# Patient Record
Sex: Female | Born: 1995
Health system: Southern US, Community
[De-identification: ages and names within clinical notes are randomized; demographics above are authoritative.]

## PROBLEM LIST (undated history)

## (undated) DIAGNOSIS — O139 Gestational [pregnancy-induced] hypertension without significant proteinuria, unspecified trimester: Secondary | ICD-10-CM

## (undated) DIAGNOSIS — Z789 Other specified health status: Secondary | ICD-10-CM

## (undated) HISTORY — PX: NO PAST SURGERIES: SHX2092

## (undated) HISTORY — DX: Gestational (pregnancy-induced) hypertension without significant proteinuria, unspecified trimester: O13.9

---
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
## 2019-02-05 NOTE — L&D Delivery Note (Addendum)
Delivery Note Called to room shortly after epidural d/t bradycardic episodes.  Had already received phenylephrine, IVF and position changes.  Found to be C/C/+1.  Pushed very well, but FHR dropped to 60's-70's with crowning.  Ctx left, pt pushing but not much more advancement.  After consenting pt and her husband, asked for [facility], Dr. Earlene Plater arrived in room.  A ctx hit pt and she pushed the baby out without having to apply the vacuum. At 11:09 PM a viable female was delivered via Vaginal, Spontaneous (Presentation: ROA, nuchal cord delivered through.  APGAR: , 9; weight pending  After 1 minute, the cord was clamped and cut. 40 units of pitocin diluted in 1000cc LR was infused rapidly IV.  The placenta separated spontaneously and delivered via CCT and maternal pushing effort.  It was inspected and appears to be intact with a 3 VC.   Cord pH: 7.05  Anesthesia: Epidural;Local Episiotomy: None Lacerations: Periurethral Suture Repair: 3.0 monocryl Est. Blood Loss (mL):  411  Mom to postpartum.  Baby to Couplet care / Skin to Skin.  Jacklyn Shell 08/02/2019, 11:44 PM

## 2019-05-05 ENCOUNTER — Encounter: Payer: Self-pay | Admitting: Advanced Practice Midwife

## 2019-05-24 DIAGNOSIS — O0930 Supervision of pregnancy with insufficient antenatal care, unspecified trimester: Secondary | ICD-10-CM | POA: Diagnosis not present

## 2019-05-24 DIAGNOSIS — Z789 Other specified health status: Secondary | ICD-10-CM | POA: Diagnosis not present

## 2019-05-24 DIAGNOSIS — Z23 Encounter for immunization: Secondary | ICD-10-CM | POA: Diagnosis not present

## 2019-05-24 DIAGNOSIS — Z3403 Encounter for supervision of normal first pregnancy, third trimester: Secondary | ICD-10-CM | POA: Diagnosis not present

## 2019-05-24 LAB — OB RESULTS CONSOLE RPR: RPR: NONREACTIVE

## 2019-05-24 LAB — OB RESULTS CONSOLE RUBELLA ANTIBODY, IGM: Rubella: IMMUNE

## 2019-05-24 LAB — OB RESULTS CONSOLE ABO/RH: RH Type: POSITIVE

## 2019-05-24 LAB — OB RESULTS CONSOLE HEPATITIS B SURFACE ANTIGEN: Hepatitis B Surface Ag: NEGATIVE

## 2019-05-24 LAB — OB RESULTS CONSOLE HIV ANTIBODY (ROUTINE TESTING): HIV: NONREACTIVE

## 2019-05-24 LAB — OB RESULTS CONSOLE GC/CHLAMYDIA
Chlamydia: NEGATIVE
Gonorrhea: NEGATIVE

## 2019-05-24 LAB — OB RESULTS CONSOLE ANTIBODY SCREEN: Antibody Screen: NEGATIVE

## 2019-05-24 LAB — OB RESULTS CONSOLE VARICELLA ZOSTER ANTIBODY, IGG: Varicella: IMMUNE

## 2019-05-25 ENCOUNTER — Other Ambulatory Visit: Payer: Self-pay | Admitting: Nurse Practitioner

## 2019-05-25 DIAGNOSIS — N6310 Unspecified lump in the right breast, unspecified quadrant: Secondary | ICD-10-CM

## 2019-06-10 DIAGNOSIS — O0930 Supervision of pregnancy with insufficient antenatal care, unspecified trimester: Secondary | ICD-10-CM | POA: Diagnosis not present

## 2019-06-10 DIAGNOSIS — O99213 Obesity complicating pregnancy, third trimester: Secondary | ICD-10-CM | POA: Diagnosis not present

## 2019-06-14 ENCOUNTER — Other Ambulatory Visit: Payer: Self-pay | Admitting: Obstetrics & Gynecology

## 2019-06-14 DIAGNOSIS — Z3A32 32 weeks gestation of pregnancy: Secondary | ICD-10-CM

## 2019-06-14 DIAGNOSIS — Z3689 Encounter for other specified antenatal screening: Secondary | ICD-10-CM

## 2019-06-14 DIAGNOSIS — O0930 Supervision of pregnancy with insufficient antenatal care, unspecified trimester: Secondary | ICD-10-CM

## 2019-06-16 ENCOUNTER — Other Ambulatory Visit: Payer: Self-pay

## 2019-06-16 ENCOUNTER — Other Ambulatory Visit: Payer: Self-pay | Admitting: Nurse Practitioner

## 2019-06-16 ENCOUNTER — Ambulatory Visit
Admission: RE | Admit: 2019-06-16 | Discharge: 2019-06-16 | Disposition: A | Discharge status: Home / Self Care | Payer: Medicaid Other | Source: Ambulatory Visit | Attending: Nurse Practitioner | Admitting: Nurse Practitioner

## 2019-06-16 DIAGNOSIS — N6310 Unspecified lump in the right breast, unspecified quadrant: Secondary | ICD-10-CM

## 2019-06-16 DIAGNOSIS — N6313 Unspecified lump in the right breast, lower outer quadrant: Secondary | ICD-10-CM | POA: Diagnosis not present

## 2019-06-21 ENCOUNTER — Encounter: Payer: Self-pay | Admitting: *Deleted

## 2019-06-23 ENCOUNTER — Ambulatory Visit: Payer: Medicaid Other | Admitting: *Deleted

## 2019-06-23 ENCOUNTER — Other Ambulatory Visit: Payer: Self-pay | Admitting: Obstetrics & Gynecology

## 2019-06-23 ENCOUNTER — Ambulatory Visit: Payer: Medicaid Other | Attending: Obstetrics and Gynecology

## 2019-06-23 ENCOUNTER — Other Ambulatory Visit: Payer: Self-pay

## 2019-06-23 ENCOUNTER — Ambulatory Visit (HOSPITAL_BASED_OUTPATIENT_CLINIC_OR_DEPARTMENT_OTHER): Payer: Medicaid Other | Admitting: Obstetrics and Gynecology

## 2019-06-23 VITALS — BP 134/80 | HR 83 | Ht 62.0 in

## 2019-06-23 DIAGNOSIS — O0990 Supervision of high risk pregnancy, unspecified, unspecified trimester: Secondary | ICD-10-CM | POA: Insufficient documentation

## 2019-06-23 DIAGNOSIS — O0933 Supervision of pregnancy with insufficient antenatal care, third trimester: Secondary | ICD-10-CM | POA: Diagnosis not present

## 2019-06-23 DIAGNOSIS — Z3A32 32 weeks gestation of pregnancy: Secondary | ICD-10-CM

## 2019-06-23 DIAGNOSIS — O0930 Supervision of pregnancy with insufficient antenatal care, unspecified trimester: Secondary | ICD-10-CM | POA: Diagnosis not present

## 2019-06-23 DIAGNOSIS — O365930 Maternal care for other known or suspected poor fetal growth, third trimester, not applicable or unspecified: Secondary | ICD-10-CM | POA: Insufficient documentation

## 2019-06-23 DIAGNOSIS — Z3A31 31 weeks gestation of pregnancy: Secondary | ICD-10-CM | POA: Insufficient documentation

## 2019-06-23 DIAGNOSIS — Z363 Encounter for antenatal screening for malformations: Secondary | ICD-10-CM

## 2019-06-23 DIAGNOSIS — Z3689 Encounter for other specified antenatal screening: Secondary | ICD-10-CM | POA: Insufficient documentation

## 2019-06-23 NOTE — Procedures (Signed)
Christie Parker 06/01/1995 [redacted]w[redacted]d  Fetus A Non-Stress Test Interpretation for 06/23/19  Indication: IUGR  Fetal Heart Rate A Mode: External Baseline Rate (A): 140 bpm Variability: Moderate Accelerations: 15 x 15 Decelerations: None Multiple birth?: No  Uterine Activity Mode: Toco Contraction Frequency (min): Occ UC noted  Contraction Duration (sec): 30-50 Contraction Quality: Mild Resting Tone Palpated: Relaxed Resting Time: Adequate  Interpretation (Fetal Testing) Nonstress Test Interpretation: Reactive Comments: FHR tracing rev'd by Dr. Shankar   

## 2019-06-23 NOTE — Procedures (Signed)
Christie Parker 01-15-1996 101w2d  Fetus A Non-Stress Test Interpretation for 06/23/19  Indication: IUGR  Fetal Heart Rate A Mode: External Baseline Rate (A): 140 bpm Variability: Moderate Accelerations: 15 x 15 Decelerations: None Multiple birth?: No  Uterine Activity Mode: Toco Contraction Frequency (min): Occ UC noted  Contraction Duration (sec): 30-50 Contraction Quality: Mild Resting Tone Palpated: Relaxed Resting Time: Adequate  Interpretation (Fetal Testing) Nonstress Test Interpretation: Reactive Comments: FHR tracing rev'd by Dr. Judeth Cornfield

## 2019-06-28 ENCOUNTER — Other Ambulatory Visit: Payer: Self-pay | Admitting: *Deleted

## 2019-06-28 DIAGNOSIS — O365930 Maternal care for other known or suspected poor fetal growth, third trimester, not applicable or unspecified: Secondary | ICD-10-CM

## 2019-07-08 ENCOUNTER — Ambulatory Visit: Payer: Medicaid Other | Admitting: *Deleted

## 2019-07-08 ENCOUNTER — Ambulatory Visit: Payer: Medicaid Other | Attending: Obstetrics and Gynecology

## 2019-07-08 ENCOUNTER — Other Ambulatory Visit: Payer: Self-pay

## 2019-07-08 ENCOUNTER — Other Ambulatory Visit: Payer: Self-pay | Admitting: Obstetrics and Gynecology

## 2019-07-08 VITALS — BP 119/75 | HR 86

## 2019-07-08 DIAGNOSIS — O365930 Maternal care for other known or suspected poor fetal growth, third trimester, not applicable or unspecified: Secondary | ICD-10-CM

## 2019-07-08 DIAGNOSIS — Z363 Encounter for antenatal screening for malformations: Secondary | ICD-10-CM

## 2019-07-08 DIAGNOSIS — O0933 Supervision of pregnancy with insufficient antenatal care, third trimester: Secondary | ICD-10-CM | POA: Diagnosis not present

## 2019-07-08 DIAGNOSIS — Z3A33 33 weeks gestation of pregnancy: Secondary | ICD-10-CM

## 2019-07-08 DIAGNOSIS — O289 Unspecified abnormal findings on antenatal screening of mother: Secondary | ICD-10-CM | POA: Diagnosis not present

## 2019-07-08 NOTE — Procedures (Signed)
Christie Parker 1996/01/10 [redacted]w[redacted]d  Fetus A Non-Stress Test Interpretation for 07/08/19  Indication: IUGR  Fetal Heart Rate A Mode: External Baseline Rate (A): 140 bpm Variability: Moderate Accelerations: 15 x 15 Decelerations: None Multiple birth?: No  Uterine Activity Mode: Toco Contraction Frequency (min): one UC noted Contraction Duration (sec): 80 Contraction Quality: Mild Resting Tone Palpated: Relaxed Resting Time: Adequate  Interpretation (Fetal Testing) Nonstress Test Interpretation: Reactive Comments: FHR tracing rev'd by Dr. Parke Poisson

## 2019-07-12 ENCOUNTER — Other Ambulatory Visit: Payer: Self-pay | Admitting: Obstetrics & Gynecology

## 2019-07-15 ENCOUNTER — Ambulatory Visit: Payer: Medicaid Other | Admitting: *Deleted

## 2019-07-15 ENCOUNTER — Other Ambulatory Visit: Payer: Self-pay | Admitting: Obstetrics and Gynecology

## 2019-07-15 ENCOUNTER — Inpatient Hospital Stay (HOSPITAL_COMMUNITY)
Admission: AD | Admit: 2019-07-15 | Discharge: 2019-07-15 | Disposition: A | Discharge status: Home / Self Care | Payer: Medicaid Other | Attending: Obstetrics and Gynecology | Admitting: Obstetrics and Gynecology

## 2019-07-15 ENCOUNTER — Ambulatory Visit: Payer: Medicaid Other | Attending: Obstetrics and Gynecology

## 2019-07-15 ENCOUNTER — Inpatient Hospital Stay (HOSPITAL_COMMUNITY): Payer: Medicaid Other

## 2019-07-15 ENCOUNTER — Encounter (HOSPITAL_COMMUNITY): Payer: Self-pay | Admitting: Obstetrics and Gynecology

## 2019-07-15 ENCOUNTER — Inpatient Hospital Stay (HOSPITAL_BASED_OUTPATIENT_CLINIC_OR_DEPARTMENT_OTHER): Payer: Medicaid Other

## 2019-07-15 ENCOUNTER — Other Ambulatory Visit: Payer: Self-pay | Admitting: *Deleted

## 2019-07-15 ENCOUNTER — Other Ambulatory Visit: Payer: Self-pay

## 2019-07-15 VITALS — BP 138/94 | HR 98

## 2019-07-15 DIAGNOSIS — O212 Late vomiting of pregnancy: Secondary | ICD-10-CM | POA: Insufficient documentation

## 2019-07-15 DIAGNOSIS — O471 False labor at or after 37 completed weeks of gestation: Secondary | ICD-10-CM | POA: Diagnosis not present

## 2019-07-15 DIAGNOSIS — K529 Noninfective gastroenteritis and colitis, unspecified: Secondary | ICD-10-CM

## 2019-07-15 DIAGNOSIS — O99891 Other specified diseases and conditions complicating pregnancy: Secondary | ICD-10-CM | POA: Diagnosis not present

## 2019-07-15 DIAGNOSIS — O365931 Maternal care for other known or suspected poor fetal growth, third trimester, fetus 1: Secondary | ICD-10-CM

## 2019-07-15 DIAGNOSIS — Z3A34 34 weeks gestation of pregnancy: Secondary | ICD-10-CM | POA: Diagnosis not present

## 2019-07-15 DIAGNOSIS — Z8759 Personal history of other complications of pregnancy, childbirth and the puerperium: Secondary | ICD-10-CM

## 2019-07-15 DIAGNOSIS — O365930 Maternal care for other known or suspected poor fetal growth, third trimester, not applicable or unspecified: Secondary | ICD-10-CM

## 2019-07-15 DIAGNOSIS — O26893 Other specified pregnancy related conditions, third trimester: Secondary | ICD-10-CM | POA: Insufficient documentation

## 2019-07-15 DIAGNOSIS — O365990 Maternal care for other known or suspected poor fetal growth, unspecified trimester, not applicable or unspecified: Secondary | ICD-10-CM

## 2019-07-15 DIAGNOSIS — K8020 Calculus of gallbladder without cholecystitis without obstruction: Secondary | ICD-10-CM | POA: Diagnosis not present

## 2019-07-15 DIAGNOSIS — O289 Unspecified abnormal findings on antenatal screening of mother: Secondary | ICD-10-CM

## 2019-07-15 DIAGNOSIS — Z362 Encounter for other antenatal screening follow-up: Secondary | ICD-10-CM

## 2019-07-15 DIAGNOSIS — R141 Gas pain: Secondary | ICD-10-CM | POA: Diagnosis not present

## 2019-07-15 DIAGNOSIS — O133 Gestational [pregnancy-induced] hypertension without significant proteinuria, third trimester: Secondary | ICD-10-CM | POA: Diagnosis not present

## 2019-07-15 DIAGNOSIS — R1011 Right upper quadrant pain: Secondary | ICD-10-CM | POA: Diagnosis not present

## 2019-07-15 DIAGNOSIS — R030 Elevated blood-pressure reading, without diagnosis of hypertension: Secondary | ICD-10-CM

## 2019-07-15 DIAGNOSIS — O139 Gestational [pregnancy-induced] hypertension without significant proteinuria, unspecified trimester: Secondary | ICD-10-CM

## 2019-07-15 DIAGNOSIS — R748 Abnormal levels of other serum enzymes: Secondary | ICD-10-CM | POA: Diagnosis not present

## 2019-07-15 DIAGNOSIS — O163 Unspecified maternal hypertension, third trimester: Secondary | ICD-10-CM | POA: Diagnosis not present

## 2019-07-15 DIAGNOSIS — O0933 Supervision of pregnancy with insufficient antenatal care, third trimester: Secondary | ICD-10-CM

## 2019-07-15 DIAGNOSIS — O1493 Unspecified pre-eclampsia, third trimester: Secondary | ICD-10-CM | POA: Diagnosis not present

## 2019-07-15 DIAGNOSIS — Z3A37 37 weeks gestation of pregnancy: Secondary | ICD-10-CM | POA: Diagnosis not present

## 2019-07-15 DIAGNOSIS — Z3A35 35 weeks gestation of pregnancy: Secondary | ICD-10-CM | POA: Diagnosis not present

## 2019-07-15 DIAGNOSIS — Z3689 Encounter for other specified antenatal screening: Secondary | ICD-10-CM | POA: Diagnosis not present

## 2019-07-15 DIAGNOSIS — R1110 Vomiting, unspecified: Secondary | ICD-10-CM

## 2019-07-15 LAB — COMPREHENSIVE METABOLIC PANEL
ALT: 14 U/L (ref 0–44)
AST: 16 U/L (ref 15–41)
Albumin: 2.5 g/dL — ABNORMAL LOW (ref 3.5–5.0)
Alkaline Phosphatase: 135 U/L — ABNORMAL HIGH (ref 38–126)
Anion gap: 11 (ref 5–15)
BUN: <5 mg/dL — ABNORMAL LOW (ref 6–20)
CO2: 19 mmol/L — ABNORMAL LOW (ref 22–32)
Calcium: 8.6 mg/dL — ABNORMAL LOW (ref 8.9–10.3)
Chloride: 106 mmol/L (ref 98–111)
Creatinine, Ser: 0.52 mg/dL (ref 0.44–1.00)
GFR calc Af Amer: >60 mL/min (ref >60)
GFR calc non Af Amer: >60 mL/min (ref >60)
Glucose, Bld: 90 mg/dL (ref 70–99)
Potassium: 3.9 mmol/L (ref 3.5–5.1)
Sodium: 136 mmol/L (ref 135–145)
Total Bilirubin: 0.5 mg/dL (ref 0.3–1.2)
Total Protein: 6.2 g/dL — ABNORMAL LOW (ref 6.5–8.1)

## 2019-07-15 LAB — CBC
HCT: 35.8 % — ABNORMAL LOW (ref 36.0–46.0)
Hemoglobin: 11.9 g/dL — ABNORMAL LOW (ref 12.0–15.0)
MCH: 30.5 pg (ref 26.0–34.0)
MCHC: 33.2 g/dL (ref 30.0–36.0)
MCV: 91.8 fL (ref 80.0–100.0)
Platelets: 231 10*3/uL (ref 150–400)
RBC: 3.9 MIL/uL (ref 3.87–5.11)
RDW: 13 % (ref 11.5–15.5)
WBC: 8.3 10*3/uL (ref 4.0–10.5)
nRBC: 0 % (ref 0.0–0.2)

## 2019-07-15 LAB — PROTEIN / CREATININE RATIO, URINE
Creatinine, Urine: 53.22 mg/dL
Protein Creatinine Ratio: 0.26 mg/mg{Cre} — ABNORMAL HIGH (ref 0.00–0.15)
Total Protein, Urine: 14 mg/dL

## 2019-07-15 LAB — GLUCOSE, CAPILLARY: Glucose-Capillary: 96 mg/dL (ref 70–99)

## 2019-07-15 MED ORDER — CYCLOBENZAPRINE HCL 5 MG PO TABS
10.0000 mg | ORAL_TABLET | Freq: Once | ORAL | Status: DC
  Filled 2019-07-15: qty 2

## 2019-07-15 MED ORDER — ONDANSETRON 4 MG PO TBDP
8.0000 mg | ORAL_TABLET | Freq: Once | ORAL | Status: AC
  Administered 2019-07-15: 8 mg via ORAL
  Filled 2019-07-15: qty 2

## 2019-07-15 MED ORDER — HYDROXYZINE HCL 50 MG PO TABS
50.0000 mg | ORAL_TABLET | Freq: Four times a day (QID) | ORAL | Status: DC | PRN
  Filled 2019-07-15: qty 1

## 2019-07-15 MED ORDER — LACTATED RINGERS IV BOLUS
1000.0000 mL | Freq: Once | INTRAVENOUS | Status: AC
  Administered 2019-07-15: 1000 mL via INTRAVENOUS

## 2019-07-15 MED ORDER — HYDROXYZINE HCL 25 MG PO TABS
25.0000 mg | ORAL_TABLET | Freq: Once | ORAL | Status: AC
  Administered 2019-07-15: 25 mg via ORAL
  Filled 2019-07-15: qty 1

## 2019-07-15 MED ORDER — FAMOTIDINE 20 MG PO TABS
20.0000 mg | ORAL_TABLET | Freq: Once | ORAL | Status: AC
  Administered 2019-07-15: 20 mg via ORAL
  Filled 2019-07-15: qty 1

## 2019-07-15 MED ORDER — ONDANSETRON 4 MG PO TBDP
4.0000 mg | ORAL_TABLET | Freq: Four times a day (QID) | ORAL | 0 refills | Status: DC | PRN
Start: 2019-07-15 — End: 2019-07-22

## 2019-07-15 MED ORDER — NIFEDIPINE 10 MG PO CAPS
10.0000 mg | ORAL_CAPSULE | ORAL | Status: AC | PRN
  Administered 2019-07-15 (×3): 10 mg via ORAL
  Filled 2019-07-15 (×3): qty 1

## 2019-07-15 MED ORDER — HYOSCYAMINE SULFATE 0.125 MG SL SUBL
0.1250 mg | SUBLINGUAL_TABLET | Freq: Once | SUBLINGUAL | Status: AC
  Administered 2019-07-15: 0.125 mg via SUBLINGUAL
  Filled 2019-07-15: qty 1

## 2019-07-15 MED ORDER — SIMETHICONE 80 MG PO CHEW: 80.0000 mg | CHEWABLE_TABLET | Freq: Four times a day (QID) | ORAL | 0 refills | Status: DC | PRN

## 2019-07-15 MED ORDER — SIMETHICONE 80 MG PO CHEW
80.0000 mg | CHEWABLE_TABLET | Freq: Four times a day (QID) | ORAL | Status: DC | PRN
  Administered 2019-07-15: 80 mg via ORAL
  Filled 2019-07-15: qty 1

## 2019-07-15 NOTE — Procedures (Signed)
Christie Parker 06/21/1995 [redacted]w[redacted]d  Fetus A Non-Stress Test Interpretation for 07/15/19  Indication: IUGR, Unsatisfactory BPP  Fetal Heart Rate A Mode: External Baseline Rate (A): 140 bpm Variability: Moderate Accelerations: 15 x 15 Decelerations: None Multiple birth?: No  Uterine Activity Mode: Palpation, Toco Contraction Frequency (min): x1 with U/I Contraction Duration (sec): 70 Contraction Quality: Mild (denies feeling any) Resting Tone Palpated: Relaxed Resting Time: Adequate  Interpretation (Fetal Testing) Nonstress Test Interpretation: Reactive Comments: Reviewed tracing with Dr. Grace Bushy, Pt sent to MAU for further monitoring.

## 2019-07-15 NOTE — Progress Notes (Signed)
Pt reports a HA rating it a 1-2/10. States she thinks it is from "nervousness".

## 2019-07-15 NOTE — Progress Notes (Signed)
Pepcid was given, patient vomited immediately after. Provider notified.

## 2019-07-15 NOTE — MAU Provider Note (Signed)
Chief Complaint:  sent from MFM for BPP 6 out of 10   First Provider Initiated Contact with Patient 07/15/19 1158     HPI: Christie Parker is a 24 y.o. G1P0 at [redacted]w[redacted]d who presents to maternity admissions upon suggestion from MFM U/S provider due to IUGR and BPP score of 6 out of 10. Additionally, patient is presenting with elevated BP readings, with most recent being 143/93. Patient is not endorsing any headaches,  RUQ pain, blurry vision, spots in visual fields, dizziness, n/v, SOB, and chest discomfort. Patient endorses swelling and joint pain of right hand that began 3-4 days ago. Patient denies dysuria. Denies contractions, leakage of fluid or vaginal bleeding. Good fetal movement.   Pregnancy Course:  Patient had a positive urine pregnancy test in [place] but did not establish prenatal care at that time. At approx, 2.5 months into pregnancy patient visited physician in [place] for concerns about vaginal bleeding and thought she may be having a miscarriage. At that point she was told she and baby are healthy. Patient immigrated to the [place] in January (when patient was 3.5 months pregnant). Patient attempted to establish prenatal care at this point in the [place] but was unable to do so due to lack of insurance and immigration status. Patient now has established prenatal care.   Past Medical History: Past Medical History:  Diagnosis Date  . Medical history non-contributory     Past obstetric history: OB History  Gravida Para Term Preterm AB Living  1            SAB TAB Ectopic Multiple Live Births               # Outcome Date GA Lbr Len/2nd Weight Sex Delivery Anes PTL Lv  1 Current             Past Surgical History: Past Surgical History:  Procedure Laterality Date  . NO PAST SURGERIES       Family History: History reviewed. No pertinent family history.  Social History: Social History   Tobacco Use  . Smoking status: Never Smoker  . Smokeless tobacco: Never Used  Vaping Use  .  Vaping Use: Never used  Substance Use Topics  . Alcohol use: Never  . Drug use: Never    Allergies: No Known Allergies  Meds:  Medications Prior to Admission  Medication Sig Dispense Refill Last Dose  . Prenatal Vit-Fe Fumarate-FA (PRENATAL VITAMIN PO) Take by mouth.   07/14/2019 at Unknown time    I have reviewed patient's Past Medical Hx, Surgical Hx, Family Hx, Social Hx, medications and allergies.   ROS:  Through ROS negative except for items mentioned in HPI.  Physical Exam   Patient Vitals for the past 24 hrs:  BP Temp Temp src Pulse Resp SpO2  07/15/19 1215 (!) 143/93 -- -- 92 -- --  07/15/19 1200 (!) 134/91 -- -- 93 -- --  07/15/19 1146 (!) 141/92 -- -- 100 -- --  07/15/19 1133 (!) 140/93 98.3 F (36.8 C) Oral -- 17 98 %   Constitutional: Well-developed, well-nourished female in no acute distress.  Cardiovascular: normal rate Respiratory: normal effort GI: Abd soft, non-tender, gravid appropriate for gestational age. MS: Extremities nontender, no edema, normal ROM Neurologic: Alert and oriented x 4. No gross neurological deficits. Pelvic Exam: deferred FHT:  Baseline 145 Bpm , good variability, accelerations present, no decelerations Contractions: irregular and few   Labs: No results found for this or any previous visit (from the past  24 hour(s)).  Imaging:  [place] MFM FETAL BPP W/NONSTRESS  Result Date: 07/15/2019 ----------------------------------------------------------------------  OBSTETRICS REPORT                         (Signed Final 07/15/2019 10:40 am) ---------------------------------------------------------------------- Patient Info  ID #:        [id number]                          D.O.B.:  1995/03/12 (24 yrs)  Name:        Christie Parker                      Visit Date: 07/15/2019 09:07 am ---------------------------------------------------------------------- Performed By  Attending:         Lin Landsman      Ref. Address:      [address]                      [place]                                        [place], [place]                                                               [address]  Performed By:      Eden Lathe BS      [facility]                                  Fetal Care  Referred By:       [facility]-                     Faculty Physician ---------------------------------------------------------------------- Orders  #   Description                          Code         Ordered By  1   [place] MFM UA CORD DOPPLER               76820.02     RAVI SHANKAR  2   [place] MFM OB FOLLOW UP                  19147.82     RAVI SHANKAR  3   [place] MFM  FETAL BPP W/NONSTRESS         29562.1      [facility] ----------------------------------------------------------------------  #   Order #                    Accession #                 Episode #  1   [id number]                  [id number]                  [id number]  2   [id number]                  [id number]                  [id number]  3   [id number]                  [id number]                  [id number] ---------------------------------------------------------------------- Indications  Encounter for other antenatal screening         Z36.2  follow-up  Maternal care for known or suspected poor       O36.5930  fetal growth, third trimester, not applicable or  unspecified IUGR  Abnormal fetal ultrasound (elevated UA          O28.9  Doppler)  Late to prenatal care, third trimester          O09.33  [redacted] weeks gestation of pregnancy                 Z3A.34 ---------------------------------------------------------------------- Fetal Evaluation  Num Of Fetuses:          1  Fetal Heart Rate(bpm):   141  Cardiac Activity:        Observed  Presentation:            Cephalic  Placenta:                Anterior  P. Cord Insertion:       Visualized  Amniotic Fluid  AFI FV:      Within normal limits   AFI Sum(cm)     %Tile       Largest Pocket(cm)  16.9            62          6.5  RUQ(cm)       RLQ(cm)        LUQ(cm)        LLQ(cm)  3.1           2.8            6.5            4.5 ---------------------------------------------------------------------- Biophysical Evaluation  Amniotic F.V:   Within normal limits        F. Tone:         Not Observed  F. Movement:    Not Observed                N.S.T:           Reactive  F. Breathing:   Observed                    Score:           6/10 ---------------------------------------------------------------------- Biometry  BPD:      81.8  mm     G. Age:  32w 6d         11  %    CI:         75.47  %    70 - 86                                                           FL/HC:       20.8  %    19.4 - 21.8  HC:      298.6   mm     G. Age:  33w 1d        2.4  %    HC/AC:       1.10       0.96 - 1.11  AC:      271.5   mm     G. Age:  31w 2d        < 1  %    FL/BPD:      76.0  %    71 - 87  FL:       62.2   mm     G. Age:  32w 2d        3.5  %    FL/AC:       22.9  %    20 - 24  HUM:      53.6   mm     G. Age:  31w 1d        < 5  %  Est. FW:    1853   gm      4 lb 1 oz    2.4  % ---------------------------------------------------------------------- OB History  Gravidity:     1         Term:  0          Prem:  0        SAB:   0  TOP:           0       Ectopic: 0         Living: 0 ---------------------------------------------------------------------- Gestational Age  LMP:            36w 2d       Date:  11/03/18                   EDD:  08/10/19  U/S Today:      32w 3d                                         EDD:  09/06/19  Best:           34w 3d    Det. By:  Previous Ultrasound        EDD:  08/23/19                                      (06/07/19) ---------------------------------------------------------------------- Anatomy  Cranium:                Appears normal         Aortic Arch:  Appears normal  Cavum:                  Appears normal         Ductal Arch:            Not  well visualized  Ventricles:             Appears normal         Diaphragm:              Appears normal  Choroid Plexus:         Appears normal         Stomach:                Appears normal, left                                                                         sided  Cerebellum:             Appears normal         Abdomen:                Appears normal  Posterior Fossa:        Previously seen        Abdominal Wall:         Previously seen  Nuchal Fold:            Not applicable (>20    Cord Vessels:           Previously seen                          wks GA)  Face:                   Appears normal         Kidneys:                Appear normal                          (orbits and profile)  Lips:                   Appears normal         Bladder:                Appears normal  Thoracic:               Previously seen        Spine:                  Previously seen  Heart:                  Appears normal         Upper Extremities:      Previously seen                          (4CH, axis, and situs)  RVOT:                   Appears normal  Lower Extremities:      Previously seen  LVOT:                   Appears normal  Other:   Heels/feet prev visualized. Nasal bone prev visualized. ---------------------------------------------------------------------- Doppler - Fetal Vessels  Umbilical Artery    S/D     %tile                                              ADFV    RDFV     3.3       88                                                 No      No ---------------------------------------------------------------------- Impression  Follow up growth given IUGR  Normal interval growth with measurements consistent with  dates.  Biophysical profile 6/10 with a reactive NST  UA Dopplers are normal  I discussed today's findings via interpreter with Ms. Posa and  her significant other (Via phone call). I recommend further  monitoring at the MAU NST with BPP in 4-6 hours. I discussed  this plan with Dr. Roland Rack.  ---------------------------------------------------------------------- Recommendations  Continue weekly testing with UA Dopplers  Delivery at 37 weeks given EFW 3%. ----------------------------------------------------------------------               Sander Nephew, MD Electronically Signed Final Report   07/15/2019 10:40 am ----------------------------------------------------------------------  [place] MFM FETAL BPP W/NONSTRESS  Result Date: 07/08/2019 ----------------------------------------------------------------------  OBSTETRICS REPORT                         (Signed Final 07/08/2019 05:12 pm) ---------------------------------------------------------------------- Patient Info  ID #:        [id number]                          D.O.B.:  1995/11/29 (24 yrs)  Name:        Christie Parker                      Visit Date: 07/08/2019 04:00 pm ---------------------------------------------------------------------- Performed By  Attending:         Sander Nephew      Ref. Address:      [address]                     [place]                                        [place], [place]  [address]  Performed By:      Reinaldo Raddle            [facility]                                                               [facility]  Referred By:       [facility]-                     Faculty Physician ---------------------------------------------------------------------- Orders  #   Description                          Code         Ordered By  1   [place] MFM FETAL BPP W/NONSTRESS         60454.0      Noralee Space  2   [place] MFM UA CORD DOPPLER               76820.02     RAVI [facility] ----------------------------------------------------------------------  #   Order #                    Accession #                 Episode #  1   [id number]                  [id number]                   [id number]  2   [id number]                  [id number]                  [id number] ---------------------------------------------------------------------- Indications  [redacted] weeks gestation of pregnancy                 Z3A.33  Encounter for antenatal screening for           Z36.3  malformations  Maternal care for known or suspected poor       O36.5930  fetal growth, third trimester, not applicable or  unspecified IUGR  Abnormal fetal ultrasound (elevated UA          O28.9  Doppler)  Late to prenatal care, third trimester          O09.33 ---------------------------------------------------------------------- Fetal Evaluation  Num Of Fetuses:          1  Fetal Heart Rate(bpm):   138  Cardiac Activity:        Observed  Presentation:            Cephalic  Placenta:                Anterior  P. Cord Insertion:       Previously Visualized  Amniotic Fluid  AFI FV:      Within normal limits  AFI Sum(cm)     %Tile       Largest Pocket(cm)  14.2  50          4.2  RUQ(cm)       RLQ(cm)        LUQ(cm)        LLQ(cm)  4.2           2.6            3.9            3.5 ---------------------------------------------------------------------- Biophysical Evaluation  Amniotic F.V:   Pocket => 2 cm              F. Tone:         Observed  F. Movement:    Observed                    Score:           8/8  F. Breathing:   Observed ---------------------------------------------------------------------- Biometry  LV:         4.9  mm ---------------------------------------------------------------------- OB History  Gravidity:     1         Term:  0          Prem:  0        SAB:   0  TOP:           0       Ectopic: 0         Living: 0 ---------------------------------------------------------------------- Gestational Age  LMP:            35w 2d       Date:  11/03/18                   EDD:  08/10/19  Best:           33w 3d    Det. By:  Previous Ultrasound        EDD:  08/23/19                                      (06/07/19)  ---------------------------------------------------------------------- Anatomy  Cranium:                Previously seen        Aortic Arch:            Previously seen  Cavum:                  Previously seen        Ductal Arch:            Not well visualized  Ventricles:             Appears normal         Diaphragm:              Appears normal  Choroid Plexus:         Previously seen        Stomach:                Appears normal, left  sided  Cerebellum:             Previously seen        Abdomen:                Appears normal  Posterior Fossa:        Previously seen        Abdominal Wall:         Previously seen  Nuchal Fold:            Not applicable (>20    Cord Vessels:           Appears normal ([redacted]                          wks GA)                                        vessel cord)  Face:                   Orbits and profile     Kidneys:                Appear normal                          previously seen  Lips:                   Previously seen        Bladder:                Appears normal  Thoracic:               Previously seen        Spine:                  Previously seen  Heart:                  Previously seen        Upper Extremities:      Previously seen  RVOT:                   Previously seen        Lower Extremities:      Previously seen  LVOT:                   Previously seen  Other:   Heels/feet prev visualized. Nasal bone prev visualized. ---------------------------------------------------------------------- Doppler - Fetal Vessels  Umbilical Artery    S/D     %tile   3.64        93 ---------------------------------------------------------------------- Cervix Uterus Adnexa  Cervix  Not visualized (advanced GA >24wks) ---------------------------------------------------------------------- Impression  Antenatal testing performed due to SGA with abnormal UA  Dopplers  Biophyiscal profile 8/8 with reactive NST  Normal UA Dopplers  today  Good fetal movement and amniotic fluid ---------------------------------------------------------------------- Recommendations  Growth and BPP scheduled in 1 week ----------------------------------------------------------------------               Lin Landsman, MD Electronically Signed Final Report   07/08/2019 05:12 pm ----------------------------------------------------------------------  [place] MFM FETAL BPP W/NONSTRESS  Result Date: 06/23/2019 ----------------------------------------------------------------------  OBSTETRICS REPORT                    (Corrected Final 06/23/2019 05:22 pm) ---------------------------------------------------------------------- Patient  Info  ID #:       [id number]                          D.O.B.:  06-22-1995 (24 yrs)  Name:       Christie Parker                      Visit Date: 06/23/2019 01:46 pm ---------------------------------------------------------------------- Performed By  Attending:        Noralee Space MD        Ref. Address:     [address]                                                             [place], [place]                                                             [address]  Performed By:     Eden Lathe BS      Location[facility]                    [facility]                                 Fetal Care  Referred By:      [facility]-                    Faculty Physician ---------------------------------------------------------------------- Orders  #  Description                           Code        Ordered By  1  [place] MFM OB DETAIL +14 WK               [id number]    Jaynie Collins  2  [place] MFM FETAL BPP                      84696.2     UGONNA ANYANWU     W/NONSTRESS  3  [place] MFM UA CORD DOPPLER                95284.13    Jaynie Collins ----------------------------------------------------------------------  #  Order #                     Accession  #  Episode #  1  [id number]                   [id number]                 [id number]  2  [id number]                   [id number]                 [id number]  3  [id number]                   [id number]                 [id number] ---------------------------------------------------------------------- Indications  Encounter for antenatal screening for          Z36.3  malformations  Maternal care for known or suspected poor      O36.5930  fetal growth, third trimester, not applicable or  unspecified IUGR  Abnormal fetal ultrasound (elevated UA         O28.9  Doppler)  Late to prenatal care, third trimester         O09.33  [redacted] weeks gestation of pregnancy                Z3A.31 ---------------------------------------------------------------------- Fetal Evaluation  Num Of Fetuses:         1  Fetal Heart Rate(bpm):  138  Cardiac Activity:       Observed  Presentation:           Cephalic  Placenta:               Anterior  P. Cord Insertion:      Visualized  Amniotic Fluid  AFI FV:      Within normal limits  AFI Sum(cm)     %Tile       Largest Pocket(cm)  19.             72          6.5  RUQ(cm)       RLQ(cm)       LUQ(cm)        LLQ(cm)  3.4           6.5           4.2            4.9 ---------------------------------------------------------------------- Biophysical Evaluation  Amniotic F.V:   Within normal limits       F. Tone:        Observed  F. Movement:    Observed                   N.S.T:          Reactive  F. Breathing:   Observed                   Score:          10/10 ---------------------------------------------------------------------- Biometry  BPD:      78.1  mm     G. Age:  31w 2d         41  %    CI:        75.19   %    70 - 86  FL/HC:      20.5   %    19.3 - 21.3  HC:      285.7  mm     G. Age:  31w 3d         17  %    HC/AC:      1.11        0.96 - 1.17  AC:      256.4  mm     G. Age:  29w 6d         11  %    FL/BPD:     74.9   %    71 - 87  FL:        58.5  mm     G. Age:  30w 4d         18  %    FL/AC:      22.8   %    20 - 24  HUM:      50.1  mm     G. Age:  29w 3d         11  %  CER:      41.5  mm     G. Age:  35w 4d       > 95  %  Est. FW:    1551  gm      3 lb 7 oz     13  % ---------------------------------------------------------------------- OB History  Gravidity:    1         Term:   0        Prem:   0        SAB:   0  TOP:          0       Ectopic:  0        Living: 0 ---------------------------------------------------------------------- Gestational Age  LMP:           33w 1d        Date:  11/03/18                 EDD:   08/10/19  U/S Today:     30w 6d                                        EDD:   08/26/19  Best:          31w 2d     Det. By:  Previous Ultrasound      EDD:   08/23/19                                      (06/07/19) ---------------------------------------------------------------------- Anatomy  Cranium:               Appears normal         Aortic Arch:            Appears normal  Cavum:                 Appears normal         Ductal Arch:            Not well visualized  Ventricles:            Appears normal  Diaphragm:              Appears normal  Choroid Plexus:        Appears normal         Stomach:                Appears normal, left                                                                        sided  Cerebellum:            Appears normal         Abdomen:                Appears normal  Posterior Fossa:       Appears normal         Abdominal Wall:         Appears nml (cord                                                                        insert, abd wall)  Nuchal Fold:           Not applicable (>20    Cord Vessels:           Appears normal ([redacted]                         wks GA)                                        vessel cord)  Face:                  Appears normal         Kidneys:                Appear normal                         (orbits and profile)  Lips:                  Appears normal         Bladder:                 Appears normal  Thoracic:              Appears normal         Spine:                  Appears normal  Heart:                 Appears normal         Upper Extremities:      Appears normal                         (  4CH, axis, and                         situs)  RVOT:                  Appears normal         Lower Extremities:      Appears normal  LVOT:                  Appears normal  Other:  Heels/feet visualized. Nasal bone visualized. ---------------------------------------------------------------------- Doppler - Fetal Vessels  Umbilical Artery   S/D     %tile      RI                                      ADFV    RDFV   4.75   > 97.5    0.79                                         No      No ---------------------------------------------------------------------- Cervix Uterus Adnexa  Cervix  Not visualized (advanced GA >24wks)  Uterus  No abnormality visualized.  Right Ovary  Within normal limits.  Left Ovary  Within normal limits.  Cul De Sac  No free fluid seen.  Adnexa  No abnormality visualized. ---------------------------------------------------------------------- Impression  Christie Parker, G1 P0 with uncertain GA, is here for ultrasound  evaluation. On your office ultrasound performed on 06/07/19,  fetal growth restriction was suspected.  Patient had one first-trimester ultrasound in [place] and was  told that her EDD is 08/25/19. Patient had noted in her phone  but no report is available for our review.  EDD by ultrasound  performed on 06/07/19 ([place]) is 08/23/19. EDD by her  LMP date (?unsure) is 08/10/19.  Based on her history, I feel it is reasonable to establish her  EDD based on ultrasound performed on 06/07/19 at  [facility] i.e. 08/23/19. She is 31w 2d pregnant  today.  On today's ultrasound, amniotic fluid is normal and good fetal  activity is seen. The estimated fetal weight is at the 13th  percentile and AC measures at the 11th percentile. Amniotic  fluid is normal and good fetal  activity is seen. Fetal anatomy  appears normal but limited by advanced gestational age.  Because of suspicion of fetal growth restriction (considering  her uncertain dates and late ultrasound estimation of EDD),  we performed umbilical artery Doppler that showed increased  S/D ratio. BPP 10/10.  NST is reactive.  I explained the findings and that her EDD is amended to  08/23/19. I explained the possibility of fetal growth restriction  (placental insufficiency is the most likely cause).  Patient informed that it will not be possible for her to come for  weekly ultrasound assessments. ---------------------------------------------------------------------- Recommendations  -An appointment was made for her to return in 2 weeks for  umbilical artery Doppler and NST.  -Fetal growth in 3 weeks. ----------------------------------------------------------------------                       Noralee Space, MD Electronically Signed Corrected Final Report  06/23/2019 05:22 pm ----------------------------------------------------------------------  [place] MFM OB DETAIL +14 WK  Result Date: 06/23/2019 ----------------------------------------------------------------------  OBSTETRICS REPORT                    (  Corrected Final 06/23/2019 05:22 pm) ---------------------------------------------------------------------- Patient Info  ID #:       [id number]                          D.O.B.:  November 22, 1995 (24 yrs)  Name:       Christie Parker                      Visit Date: 06/23/2019 01:46 pm ---------------------------------------------------------------------- Performed By  Attending:        Noralee Space MD        Ref. Address:     [address]                                                             [place], [place]                                                             [address]  Performed By:     Eden Lathe BS      [facility]                     RDMS RVT                                 Fetal Care  Referred By:      [facility]-                    Faculty Physician ---------------------------------------------------------------------- Orders  #  Description                           Code        Ordered By  1  [place] MFM OB DETAIL +14 WK               [id number]    Jaynie Collins  2  [place] MFM FETAL BPP                      76818.5     UGONNA ANYANWU     W/NONSTRESS  3  [place] MFM UA CORD DOPPLER                19147.82    Jaynie Collins ----------------------------------------------------------------------  #  Order #  Accession #                Episode #  1  [id number]                   [id number]                 [id number]  2  [id number]                   [id number]                 [id number]  3  [id number]                   [id number]                 [id number] ---------------------------------------------------------------------- Indications  Encounter for antenatal screening for          Z36.3  malformations  Maternal care for known or suspected poor      O36.5930  fetal growth, third trimester, not applicable or  unspecified IUGR  Abnormal fetal ultrasound (elevated UA         O28.9  Doppler)  Late to prenatal care, third trimester         O09.33  [redacted] weeks gestation of pregnancy                Z3A.31 ---------------------------------------------------------------------- Fetal Evaluation  Num Of Fetuses:         1  Fetal Heart Rate(bpm):  138  Cardiac Activity:       Observed  Presentation:           Cephalic  Placenta:               Anterior  P. Cord Insertion:      Visualized  Amniotic Fluid  AFI FV:      Within normal limits  AFI Sum(cm)     %Tile       Largest Pocket(cm)  19.             72          6.5  RUQ(cm)       RLQ(cm)       LUQ(cm)        LLQ(cm)  3.4           6.5           4.2            4.9 ---------------------------------------------------------------------- Biophysical Evaluation  Amniotic F.V:    Within normal limits       F. Tone:        Observed  F. Movement:    Observed                   N.S.T:          Reactive  F. Breathing:   Observed                   Score:          10/10 ---------------------------------------------------------------------- Biometry  BPD:      78.1  mm     G. Age:  31w 2d         41  %    CI:        75.19   %    70 - 86  FL/HC:      20.5   %    19.3 - 21.3  HC:      285.7  mm     G. Age:  31w 3d         17  %    HC/AC:      1.11        0.96 - 1.17  AC:      256.4  mm     G. Age:  29w 6d         11  %    FL/BPD:     74.9   %    71 - 87  FL:       58.5  mm     G. Age:  30w 4d         18  %    FL/AC:      22.8   %    20 - 24  HUM:      50.1  mm     G. Age:  29w 3d         11  %  CER:      41.5  mm     G. Age:  35w 4d       > 95  %  Est. FW:    1551  gm      3 lb 7 oz     13  % ---------------------------------------------------------------------- OB History  Gravidity:    1         Term:   0        Prem:   0        SAB:   0  TOP:          0       Ectopic:  0        Living: 0 ---------------------------------------------------------------------- Gestational Age  LMP:           33w 1d        Date:  11/03/18                 EDD:   08/10/19  U/S Today:     30w 6d                                        EDD:   08/26/19  Best:          31w 2d     Det. By:  Previous Ultrasound      EDD:   08/23/19                                      (06/07/19) ---------------------------------------------------------------------- Anatomy  Cranium:               Appears normal         Aortic Arch:            Appears normal  Cavum:                 Appears normal         Ductal Arch:            Not well visualized  Ventricles:            Appears normal         Diaphragm:  Appears normal  Choroid Plexus:        Appears normal         Stomach:                Appears normal, left                                                                         sided  Cerebellum:            Appears normal         Abdomen:                Appears normal  Posterior Fossa:       Appears normal         Abdominal Wall:         Appears nml (cord                                                                        insert, abd wall)  Nuchal Fold:           Not applicable (>20    Cord Vessels:           Appears normal ([redacted]                         wks GA)                                        vessel cord)  Face:                  Appears normal         Kidneys:                Appear normal                         (orbits and profile)  Lips:                  Appears normal         Bladder:                Appears normal  Thoracic:              Appears normal         Spine:                  Appears normal  Heart:                 Appears normal         Upper Extremities:      Appears normal                         (4CH, axis, and  situs)  RVOT:                  Appears normal         Lower Extremities:      Appears normal  LVOT:                  Appears normal  Other:  Heels/feet visualized. Nasal bone visualized. ---------------------------------------------------------------------- Doppler - Fetal Vessels  Umbilical Artery   S/D     %tile      RI                                      ADFV    RDFV   4.75   > 97.5    0.79                                         No      No ---------------------------------------------------------------------- Cervix Uterus Adnexa  Cervix  Not visualized (advanced GA >24wks)  Uterus  No abnormality visualized.  Right Ovary  Within normal limits.  Left Ovary  Within normal limits.  Cul De Sac  No free fluid seen.  Adnexa  No abnormality visualized. ---------------------------------------------------------------------- Impression  Christie Parker, G1 P0 with uncertain GA, is here for ultrasound  evaluation. On your office ultrasound performed on 06/07/19,  fetal growth restriction was suspected.  Patient had one first-trimester ultrasound in  [place] and was  told that her EDD is 08/25/19. Patient had noted in her phone  but no report is available for our review.  EDD by ultrasound  performed on 06/07/19 ([place]) is 08/23/19. EDD by her  LMP date (?unsure) is 08/10/19.  Based on her history, I feel it is reasonable to establish her  EDD based on ultrasound performed on 06/07/19 at  [facility] i.e. 08/23/19. She is 31w 2d pregnant  today.  On today's ultrasound, amniotic fluid is normal and good fetal  activity is seen. The estimated fetal weight is at the 13th  percentile and AC measures at the 11th percentile. Amniotic  fluid is normal and good fetal activity is seen. Fetal anatomy  appears normal but limited by advanced gestational age.  Because of suspicion of fetal growth restriction (considering  her uncertain dates and late ultrasound estimation of EDD),  we performed umbilical artery Doppler that showed increased  S/D ratio. BPP 10/10.  NST is reactive.  I explained the findings and that her EDD is amended to  08/23/19. I explained the possibility of fetal growth restriction  (placental insufficiency is the most likely cause).  Patient informed that it will not be possible for her to come for  weekly ultrasound assessments. ---------------------------------------------------------------------- Recommendations  -An appointment was made for her to return in 2 weeks for  umbilical artery Doppler and NST.  -Fetal growth in 3 weeks. ----------------------------------------------------------------------                       Noralee Space, MD Electronically Signed Corrected Final Report  06/23/2019 05:22 pm ----------------------------------------------------------------------  US BREAST LTD UNI RIGHT INC AXILLA  Result Date: 06/16/2019 CLINICAL DATA:  Palpable abnormality in the RIGHT breast noted on recent physical exam. Patient is 8 months pregnant. Patient does not really feel a mass in the RIGHT breast. EXAM: ULTRASOUND OF  THE RIGHT  BREAST COMPARISON:  Baseline evaluation FINDINGS: On physical exam, I palpate a rounded mobile superficial mass in the LOWER OUTER QUADRANT of the RIGHT breast. Targeted ultrasound is performed, showing a circumscribed oval hypoechoic parallel mass in the 7 o'clock location of the RIGHT breast 4 centimeters from the nipple measuring 2.1 x 1.6 x 1.2 centimeters. Presence of internal blood flow documented by Doppler evaluation. Targeted evaluation of the RIGHT axilla shows no adenopathy. 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 We discussed management options including excision, biopsy, and close follow-up. Imaging followup is recommended at 6, 12, and 24 months to assess stability. The patient concurs with this plan. RECOMMENDATION: Recommend RIGHT breast ultrasound in 3 months following delivery. I have discussed the findings and recommendations with the patient with the assistance of an interpreter. If applicable, a reminder letter will be sent to the patient regarding the next appointment. BI-RADS CATEGORY  3: Probably benign. Electronically Signed   By: Norva Pavlov M.D.   On: 06/16/2019 13:37   [place] MFM OB FOLLOW UP  Result Date: 07/15/2019 ----------------------------------------------------------------------  OBSTETRICS REPORT                         (Signed Final 07/15/2019 10:40 am) ---------------------------------------------------------------------- Patient Info  ID #:        [id number]                          D.O.B.:  [birth year] (24 yrs)  Name:        Christie Parker                      Visit Date: 07/15/2019 09:07 am ---------------------------------------------------------------------- Performed By  Attending:         Lin Landsman      Ref. Address:      [address]                     [place]                                        [place], [place]                                                                [address]  Performed By:      Eden Lathe BS      Location[facility]                     RDMS RVT  Fetal Care  Referred By:       [facility]-                     Faculty Physician ---------------------------------------------------------------------- Orders  #   Description                          Code         Ordered By  1   [place] MFM UA CORD DOPPLER               76820.02     RAVI SHANKAR  2   [place] MFM OB FOLLOW UP                  76816.01     RAVI SHANKAR  3   [place] MFM FETAL BPP W/NONSTRESS         [address].1      RAVI [employer] ----------------------------------------------------------------------  #   Order #                    Accession #                 Episode #  1   [id number]                  [id number]  2   [id number]                  [id number]  3   [id number]                  [id number]                  [id number] ---------------------------------------------------------------------- Indications  Encounter for other antenatal screening         Z36.2  follow-up  Maternal care for known or suspected poor       O36.5930  fetal growth, third trimester, not applicable or  unspecified IUGR  Abnormal fetal ultrasound (elevated UA          O28.9  Doppler)  Late to prenatal care, third trimester          O09.33  [redacted] weeks gestation of pregnancy                 Z3A.34 ---------------------------------------------------------------------- Fetal Evaluation  Num Of Fetuses:          1  Fetal Heart Rate(bpm):   141  Cardiac Activity:        Observed  Presentation:            Cephalic  Placenta:                Anterior  P. Cord Insertion:       Visualized  Amniotic Fluid  AFI FV:      Within normal limits  AFI Sum(cm)     %Tile       Largest Pocket(cm)  16.9            62          6.5  RUQ(cm)  RLQ(cm)        LUQ(cm)        LLQ(cm)  3.1           2.8            6.5             4.5 ---------------------------------------------------------------------- Biophysical Evaluation  Amniotic F.V:   Within normal limits        F. Tone:         Not Observed  F. Movement:    Not Observed                N.S.T:           Reactive  F. Breathing:   Observed                    Score:           6/10 ---------------------------------------------------------------------- Biometry  BPD:      81.8   mm     G. Age:  32w 6d         11  %    CI:         75.47  %    70 - 86                                                           FL/HC:       20.8  %    19.4 - 21.8  HC:      298.6   mm     G. Age:  33w 1d        2.4  %    HC/AC:       1.10       0.96 - 1.11  AC:      271.5   mm     G. Age:  31w 2d        < 1  %    FL/BPD:      76.0  %    71 - 87  FL:       62.2   mm     G. Age:  32w 2d        3.5  %    FL/AC:       22.9  %    20 - 24  HUM:      53.6   mm     G. Age:  31w 1d        < 5  %  Est. FW:    1853   gm      4 lb 1 oz    2.4  % ---------------------------------------------------------------------- OB History  Gravidity:     1         Term:  0          Prem:  0        SAB:   0  TOP:           0       Ectopic: 0         Living: 0 ---------------------------------------------------------------------- Gestational Age  LMP:            36w 2d       Date:  11/03/18  EDD:  08/10/19  U/S Today:      32w 3d                                         EDD:  09/06/19  Best:           34w 3d    Det. By:  Previous Ultrasound        EDD:  08/23/19                                      (06/07/19) ---------------------------------------------------------------------- Anatomy  Cranium:                Appears normal         Aortic Arch:            Appears normal  Cavum:                  Appears normal         Ductal Arch:            Not well visualized  Ventricles:             Appears normal         Diaphragm:              Appears normal  Choroid Plexus:         Appears normal         Stomach:                 Appears normal, left                                                                         sided  Cerebellum:             Appears normal         Abdomen:                Appears normal  Posterior Fossa:        Previously seen        Abdominal Wall:         Previously seen  Nuchal Fold:            Not applicable (>20    Cord Vessels:           Previously seen                          wks GA)  Face:                   Appears normal         Kidneys:                Appear normal                          (orbits and profile)  Lips:                   Appears  normal         Bladder:                Appears normal  Thoracic:               Previously seen        Spine:                  Previously seen  Heart:                  Appears normal         Upper Extremities:      Previously seen                          (4CH, axis, and situs)  RVOT:                   Appears normal         Lower Extremities:      Previously seen  LVOT:                   Appears normal  Other:   Heels/feet prev visualized. Nasal bone prev visualized. ---------------------------------------------------------------------- Doppler - Fetal Vessels  Umbilical Artery    S/D     %tile                                              ADFV    RDFV     3.3       88                                                 No      No ---------------------------------------------------------------------- Impression  Follow up growth given IUGR  Normal interval growth with measurements consistent with  dates.  Biophysical profile 6/10 with a reactive NST  UA Dopplers are normal  I discussed today's findings via interpreter with Christie Parker and  her significant other (Via phone call). I recommend further  monitoring at the MAU NST with BPP in 4-6 hours. I discussed  this plan with Dr. Ladean Raya. ---------------------------------------------------------------------- Recommendations  Continue weekly testing with UA Dopplers  Delivery at 37 weeks given EFW 3%.  ----------------------------------------------------------------------               Lin Landsman, MD Electronically Signed Final Report   07/15/2019 10:40 am ----------------------------------------------------------------------  [place] MFM UA CORD DOPPLER  Result Date: 07/15/2019 ----------------------------------------------------------------------  OBSTETRICS REPORT                         (Signed Final 07/15/2019 10:40 am) ---------------------------------------------------------------------- Patient Info  ID #:        [id number]                          D.O.B.:  [birth year] (24 yrs)  Name:        Christie Parker                      Visit Date: 07/15/2019 09:07 am ---------------------------------------------------------------------- Performed By  Attending:         Bettey Costa  Booker      Ref. Address:      [address]                     [place]                                        [place], [place]                                                               [address]  Performed By:      Eden Lathe BS      Location[facility]                     RDMS RVT                                  Fetal Care  Referred By:       [facility]-                     Faculty Physician ---------------------------------------------------------------------- Orders  #   Description                          Code         Ordered By  1   [place] MFM UA CORD DOPPLER               76820.02     RAVI SHANKAR  2   [place] MFM OB FOLLOW UP                  76816.01     RAVI SHANKAR  3   [place] MFM FETAL BPP W/NONSTRESS         60454.0      RAVI [facility] ----------------------------------------------------------------------  #   Order #                    Accession #                 Episode #  1   [id number]                  [id number]  2   [id number]                  [id number]  3    [id number]                  [id number]                  [id number] ---------------------------------------------------------------------- Indications  Encounter for other antenatal screening         Z36.2  follow-up  Maternal care for known or suspected poor       O36.5930  fetal growth, third trimester, not applicable or  unspecified IUGR  Abnormal fetal ultrasound (elevated UA          O28.9  Doppler)  Late to prenatal care, third trimester          O09.33  [redacted] weeks gestation of pregnancy                 Z3A.34 ---------------------------------------------------------------------- Fetal Evaluation  Num Of Fetuses:          1  Fetal Heart Rate(bpm):   141  Cardiac Activity:        Observed  Presentation:            Cephalic  Placenta:                Anterior  P. Cord Insertion:       Visualized  Amniotic Fluid  AFI FV:      Within normal limits  AFI Sum(cm)     %Tile       Largest Pocket(cm)  16.9            62          6.5  RUQ(cm)       RLQ(cm)        LUQ(cm)        LLQ(cm)  3.1           2.8            6.5            4.5 ---------------------------------------------------------------------- Biophysical Evaluation  Amniotic F.V:   Within normal limits        F. Tone:         Not Observed  F. Movement:    Not Observed                N.S.T:           Reactive  F. Breathing:   Observed                    Score:           6/10 ---------------------------------------------------------------------- Biometry  BPD:      81.8   mm     G. Age:  32w 6d         11  %    CI:         75.47  %    70 - 86                                                           FL/HC:       20.8  %    19.4 - 21.8  HC:      298.6   mm     G. Age:  33w 1d        2.4  %  HC/AC:       1.10       0.96 - 1.11  AC:      271.5   mm     G. Age:  31w 2d        < 1  %    FL/BPD:      76.0  %    71 - 87  FL:       62.2   mm     G. Age:  32w 2d        3.5  %    FL/AC:       22.9  %    20 - 24  HUM:      53.6   mm     G. Age:  31w 1d        < 5  %  Est. FW:     1853   gm      4 lb 1 oz    2.4  % ---------------------------------------------------------------------- OB History  Gravidity:     1         Term:  0          Prem:  0        SAB:   0  TOP:           0       Ectopic: 0         Living: 0 ---------------------------------------------------------------------- Gestational Age  LMP:            36w 2d       Date:  11/03/18                   EDD:  08/10/19  U/S Today:      32w 3d                                         EDD:  09/06/19  Best:           34w 3d    Det. By:  Previous Ultrasound        EDD:  08/23/19                                      (06/07/19) ---------------------------------------------------------------------- Anatomy  Cranium:                Appears normal         Aortic Arch:            Appears normal  Cavum:                  Appears normal         Ductal Arch:            Not well visualized  Ventricles:             Appears normal         Diaphragm:              Appears normal  Choroid Plexus:         Appears normal         Stomach:                Appears normal, left  sided  Cerebellum:             Appears normal         Abdomen:                Appears normal  Posterior Fossa:        Previously seen        Abdominal Wall:         Previously seen  Nuchal Fold:            Not applicable (>20    Cord Vessels:           Previously seen                          wks GA)  Face:                   Appears normal         Kidneys:                Appear normal                          (orbits and profile)  Lips:                   Appears normal         Bladder:                Appears normal  Thoracic:               Previously seen        Spine:                  Previously seen  Heart:                  Appears normal         Upper Extremities:      Previously seen                          (4CH, axis, and situs)  RVOT:                   Appears normal         Lower Extremities:       Previously seen  LVOT:                   Appears normal  Other:   Heels/feet prev visualized. Nasal bone prev visualized. ---------------------------------------------------------------------- Doppler - Fetal Vessels  Umbilical Artery    S/D     %tile                                              ADFV    RDFV     3.3       88                                                 No      No ---------------------------------------------------------------------- Impression  Follow up growth given IUGR  Normal interval growth with measurements consistent with  dates.  Biophysical profile 6/10  with a reactive NST  UA Dopplers are normal  I discussed today's findings via interpreter with Christie Parker and  her significant other (Via phone call). I recommend further  monitoring at the MAU NST with BPP in 4-6 hours. I discussed  this plan with Dr. Ladean Raya. ---------------------------------------------------------------------- Recommendations  Continue weekly testing with UA Dopplers  Delivery at 37 weeks given EFW 3%. ----------------------------------------------------------------------               Lin Landsman, MD Electronically Signed Final Report   07/15/2019 10:40 am ----------------------------------------------------------------------  [place] MFM UA CORD DOPPLER  Result Date: 07/08/2019 ----------------------------------------------------------------------  OBSTETRICS REPORT                         (Signed Final 07/08/2019 05:12 pm) ---------------------------------------------------------------------- Patient Info  ID #:        [id number]                          D.O.B.:  1995/09/08 (24 yrs)  Name:        Christie Parker                      Visit Date: 07/08/2019 04:00 pm ---------------------------------------------------------------------- Performed By  Attending:         Lin Landsman      Ref. Address:      [address]                     [place]                                        [place], [place]                                                               [address]  Performed By:      Reinaldo Raddle            Location[facility]          [facility]  Referred By:       [facility]-                     Faculty Physician ---------------------------------------------------------------------- Orders  #   Description  Code         Ordered By  1   [place] MFM FETAL BPP W/NONSTRESS         [id number]      Noralee Space  2   [place] MFM UA CORD DOPPLER               [id number]     [facility] ----------------------------------------------------------------------  #   Order #                    Accession #                 Episode #  1   [id number]                  [id number]                  [id number]  2   [id number]                  [id number]                  [id number] ---------------------------------------------------------------------- Indications  [redacted] weeks gestation of pregnancy                 Z3A.33  Encounter for antenatal screening for           Z36.3  malformations  Maternal care for known or suspected poor       O36.5930  fetal growth, third trimester, not applicable or  unspecified IUGR  Abnormal fetal ultrasound (elevated UA          O28.9  Doppler)  Late to prenatal care, third trimester          O09.33 ---------------------------------------------------------------------- Fetal Evaluation  Num Of Fetuses:          1  Fetal Heart Rate(bpm):   138  Cardiac Activity:        Observed  Presentation:            Cephalic  Placenta:                Anterior  P. Cord Insertion:       Previously Visualized  Amniotic Fluid  AFI FV:      Within normal limits  AFI Sum(cm)     %Tile       Largest Pocket(cm)  14.2            50          4.2  RUQ(cm)       RLQ(cm)        LUQ(cm)        LLQ(cm)  4.2           2.6            3.9            3.5  ---------------------------------------------------------------------- Biophysical Evaluation  Amniotic F.V:   Pocket => 2 cm              F. Tone:         Observed  F. Movement:    Observed                    Score:           8/8  F. Breathing:   Observed ---------------------------------------------------------------------- Biometry  LV:         4.9  mm ---------------------------------------------------------------------- OB History  Gravidity:     1  Term:  0          Prem:  0        SAB:   0  TOP:           0       Ectopic: 0         Living: 0 ---------------------------------------------------------------------- Gestational Age  LMP:            35w 2d       Date:  11/03/18                   EDD:  08/10/19  Best:           33w 3d    Det. By:  Previous Ultrasound        EDD:  08/23/19                                      (06/07/19) ---------------------------------------------------------------------- Anatomy  Cranium:                Previously seen        Aortic Arch:            Previously seen  Cavum:                  Previously seen        Ductal Arch:            Not well visualized  Ventricles:             Appears normal         Diaphragm:              Appears normal  Choroid Plexus:         Previously seen        Stomach:                Appears normal, left                                                                         sided  Cerebellum:             Previously seen        Abdomen:                Appears normal  Posterior Fossa:        Previously seen        Abdominal Wall:         Previously seen  Nuchal Fold:            Not applicable (>20    Cord Vessels:           Appears normal ([redacted]                          wks GA)                                        vessel cord)  Face:  Orbits and profile     Kidneys:                Appear normal                          previously seen  Lips:                   Previously seen        Bladder:                Appears normal  Thoracic:                Previously seen        Spine:                  Previously seen  Heart:                  Previously seen        Upper Extremities:      Previously seen  RVOT:                   Previously seen        Lower Extremities:      Previously seen  LVOT:                   Previously seen  Other:   Heels/feet prev visualized. Nasal bone prev visualized. ---------------------------------------------------------------------- Doppler - Fetal Vessels  Umbilical Artery    S/D     %tile   3.64        93 ---------------------------------------------------------------------- Cervix Uterus Adnexa  Cervix  Not visualized (advanced GA >24wks) ---------------------------------------------------------------------- Impression  Antenatal testing performed due to SGA with abnormal UA  Dopplers  Biophyiscal profile 8/8 with reactive NST  Normal UA Dopplers today  Good fetal movement and amniotic fluid ---------------------------------------------------------------------- Recommendations  Growth and BPP scheduled in 1 week ----------------------------------------------------------------------               Lin Landsman, MD Electronically Signed Final Report   07/08/2019 05:12 pm ----------------------------------------------------------------------  [place] MFM UA CORD DOPPLER  Result Date: 06/23/2019 ----------------------------------------------------------------------  OBSTETRICS REPORT                    (Corrected Final 06/23/2019 05:22 pm) ---------------------------------------------------------------------- Patient Info  ID #:       [id number]                          D.O.B.:  October 27, 1995 (24 yrs)  Name:       Christie Parker                      Visit Date: 06/23/2019 01:46 pm ---------------------------------------------------------------------- Performed By  Attending:        Noralee Space MD        Ref. Address:     [address]                                                             [employer]  [place], [place]                                                             [address]  Performed By:     Eden Lathe BS      Location[facility]                                 [facility]etal Care  Referred By:      [facility]-                    Faculty Physician ---------------------------------------------------------------------- Orders  #  Description                           Code        Ordered By  1  [place] MFM OB DETAIL +14 WK               [address].01    UGONNA ANYANWU  2  [place] MFM FETAL BPP                      76818.5     UGONNA ANYANWU     W/NONSTRESS  3  [place] MFM UA CORD DOPPLER                46962.95    Jaynie Collins ----------------------------------------------------------------------  #  Order #                     Accession #                Episode #  1  [id number]                   [id number]                 [id number]  2  [id number]                   [id number]                 [id number]  3  [id number]                   [id number]                 [id number] ---------------------------------------------------------------------- Indications  Encounter for antenatal screening for          Z36.3  malformations  Maternal care for known or suspected poor      O36.5930  fetal growth, third trimester, not applicable or  unspecified IUGR  Abnormal fetal ultrasound (elevated UA         O28.9  Doppler)  Late to prenatal care, third trimester         O09.33  [redacted] weeks gestation of pregnancy  Z3A.31 ---------------------------------------------------------------------- Fetal Evaluation  Num Of Fetuses:         1  Fetal Heart Rate(bpm):  138  Cardiac Activity:       Observed  Presentation:           Cephalic  Placenta:               Anterior  P. Cord Insertion:      Visualized  Amniotic Fluid  AFI FV:      Within normal limits  AFI Sum(cm)     %Tile       Largest Pocket(cm)  19.             72          6.5   RUQ(cm)       RLQ(cm)       LUQ(cm)        LLQ(cm)  3.4           6.5           4.2            4.9 ---------------------------------------------------------------------- Biophysical Evaluation  Amniotic F.V:   Within normal limits       F. Tone:        Observed  F. Movement:    Observed                   N.S.T:          Reactive  F. Breathing:   Observed                   Score:          10/10 ---------------------------------------------------------------------- Biometry  BPD:      78.1  mm     G. Age:  31w 2d         41  %    CI:        75.19   %    70 - 86                                                          FL/HC:      20.5   %    19.3 - 21.3  HC:      285.7  mm     G. Age:  31w 3d         17  %    HC/AC:      1.11        0.96 - 1.17  AC:      256.4  mm     G. Age:  29w 6d         11  %    FL/BPD:     74.9   %    71 - 87  FL:       58.5  mm     G. Age:  30w 4d         18  %    FL/AC:      22.8   %    20 - 24  HUM:      50.1  mm     G. Age:  29w 3d         11  %  CER:      41.5  mm     G. Age:  35w 4d       > 95  %  Est. FW:    1551  gm      3 lb 7 oz     13  % ---------------------------------------------------------------------- OB History  Gravidity:    1         Term:   0        Prem:   0        SAB:   0  TOP:          0       Ectopic:  0        Living: 0 ---------------------------------------------------------------------- Gestational Age  LMP:           33w 1d        Date:  11/03/18                 EDD:   08/10/19  U/S Today:     30w 6d                                        EDD:   08/26/19  Best:          31w 2d     Det. By:  Previous Ultrasound      EDD:   08/23/19                                      (06/07/19) ---------------------------------------------------------------------- Anatomy  Cranium:               Appears normal         Aortic Arch:            Appears normal  Cavum:                 Appears normal         Ductal Arch:            Not well visualized  Ventricles:            Appears  normal         Diaphragm:              Appears normal  Choroid Plexus:        Appears normal         Stomach:                Appears normal, left                                                                        sided  Cerebellum:            Appears normal         Abdomen:                Appears normal  Posterior Fossa:       Appears normal         Abdominal Wall:         Appears nml (cord  insert, abd wall)  Nuchal Fold:           Not applicable (>20    Cord Vessels:           Appears normal ([redacted]                         wks GA)                                        vessel cord)  Face:                  Appears normal         Kidneys:                Appear normal                         (orbits and profile)  Lips:                  Appears normal         Bladder:                Appears normal  Thoracic:              Appears normal         Spine:                  Appears normal  Heart:                 Appears normal         Upper Extremities:      Appears normal                         (4CH, axis, and                         situs)  RVOT:                  Appears normal         Lower Extremities:      Appears normal  LVOT:                  Appears normal  Other:  Heels/feet visualized. Nasal bone visualized. ---------------------------------------------------------------------- Doppler - Fetal Vessels  Umbilical Artery   S/D     %tile      RI                                      ADFV    RDFV   4.75   > 97.5    0.79                                         No      No ---------------------------------------------------------------------- Cervix Uterus Adnexa  Cervix  Not visualized (advanced GA >24wks)  Uterus  No abnormality visualized.  Right Ovary  Within normal limits.  Left Ovary  Within normal limits.  Cul De Sac  No free fluid seen.  Adnexa  No abnormality visualized. ----------------------------------------------------------------------  Impression  Ms.  Parker, G1 P0 with uncertain GA, is here for ultrasound  evaluation. On your office ultrasound performed on 06/07/19,  fetal growth restriction was suspected.  Patient had one first-trimester ultrasound in [place] and was  told that her EDD is 08/25/19. Patient had noted in her phone  but no report is available for our review.  EDD by ultrasound  performed on 06/07/19 ([place]) is 08/23/19. EDD by her  LMP date (?unsure) is 08/10/19.  Based on her history, I feel it is reasonable to establish her  EDD based on ultrasound performed on 06/07/19 at  [facility] i.e. 08/23/19. She is 31w 2d pregnant  today.  On today's ultrasound, amniotic fluid is normal and good fetal  activity is seen. The estimated fetal weight is at the 13th  percentile and AC measures at the 11th percentile. Amniotic  fluid is normal and good fetal activity is seen. Fetal anatomy  appears normal but limited by advanced gestational age.  Because of suspicion of fetal growth restriction (considering  her uncertain dates and late ultrasound estimation of EDD),  we performed umbilical artery Doppler that showed increased  S/D ratio. BPP 10/10.  NST is reactive.  I explained the findings and that her EDD is amended to  08/23/19. I explained the possibility of fetal growth restriction  (placental insufficiency is the most likely cause).  Patient informed that it will not be possible for her to come for  weekly ultrasound assessments. ---------------------------------------------------------------------- Recommendations  -An appointment was made for her to return in 2 weeks for  umbilical artery Doppler and NST.  -Fetal growth in 3 weeks. ----------------------------------------------------------------------                       Noralee Space, MD Electronically Signed Corrected Final Report  06/23/2019 05:22 pm ----------------------------------------------------------------------   MAU Course: Patient was checked in and  through HPI and ROS was obtained using Interpretor services. FHT monitors were placed on baby. Maternal blood pressures were continuously monitored and Pre-e labs were drawn due to elevated BP readings. PCR was slightly elevated at .26, BUN and Creatinine were wnl. Toco monitoring shows regular contractions for which patient was given Procardia x3 and IV fluids. Cervical exam x2 was closed and thick.  Patient also became tachycardic (135-150 bpm) most likely due to anxiety, for which the patient was given hydroxyzine.  Patient was sent for repeat U/S and BPP resulting in an 8/8 score. After Hydroxyzine, patient's HR lowered to 130 and patient was reassuring.   Assessment: Pt. Is a [redacted]w[redacted]d G1P0 that presented to MAU 2/2 to non-reassuring BPP at MFM (6/10), elevated blood pressures, tachycardia, and regular contractions. After receiving Procardia x3, Hydroxyzine, and IV fluids, patient is hemodynamically stable. On repeat BPP, baby scored an 8/8. Both mom and baby are reassuring.  Plan: Discharge home in stable condition. Transition follow-up prenatal care to [facility]. Labor precautions and fetal kick counts   Joyce Gross, Medical Student 07/15/2019 12:21 PM

## 2019-07-15 NOTE — Discharge Instructions (Signed)
Vomiting, Adult Vomiting occurs when stomach contents are thrown up and out of the mouth. Many people notice nausea before vomiting. Vomiting can make you feel weak and cause you to become dehydrated. Dehydration can make you feel tired and thirsty, cause you to have a dry mouth, and decrease how often you urinate. Older adults and people who have other diseases or a weak body defense system (immune system) are at higher risk for dehydration. It is important to treat vomiting as told by your health care provider. Follow these instructions at home:  Eating and drinking     Follow these recommendations as told by your health care provider:  Take an oral rehydration solution (ORS). This is a drink that is sold at pharmacies and retail stores.  Eat bland, easy-to-digest foods in small amounts as you are able. These foods include bananas, applesauce, rice, lean meats, toast, and crackers.  Drink clear fluids slowly and in small amounts as you are able. Clear fluids include water, ice chips, low-calorie sports drinks, and fruit juice that has water added (diluted fruit juice).  Avoid drinking fluids that contain a lot of sugar or caffeine, such as energy drinks, sports drinks, and soda.  Avoid alcohol.  Avoid spicy or fatty foods.  General instructions  Wash your hands often using soap and water. If soap and water are not available, use hand sanitizer. Make sure that everyone in your household washes their hands frequently.  Take over-the-counter and prescription medicines only as told by your health care provider.  Rest at home while you recover.  Watch your condition for any changes.  Keep all follow-up visits as told by your health care provider. This is important. Contact a health care provider if:  Your vomiting gets worse.  You have new symptoms.  You have a fever.  You cannot drink fluids without vomiting.  You feel light-headed or dizzy.  You have a headache.  You  have muscle cramps.  You have a rash.  You have pain while urinating. Get help right away if:  You have pain in your chest, neck, arm, or jaw.  You feel extremely weak or you faint.  You have persistent vomiting.  You have vomit that is bright red or looks like black coffee grounds.  You have stools that are bloody or black, or stools that look like tar.  You have a severe headache, a stiff neck, or both.  You have severe pain, cramping, or bloating in your abdomen.  You have trouble breathing or you are breathing very quickly.  Your heart is beating very quickly.  Your skin feels cold and clammy.  You feel confused.  You have signs of dehydration, such as: ? Dark urine, very little urine, or no urine. ? Cracked lips. ? Dry mouth. ? Sunken eyes. ? Sleepiness. ? Weakness. These symptoms may represent a serious problem that is an emergency. Do not wait to see if the symptoms will go away. Get medical help right away. Call your local emergency services (911 in the [place] Do not drive yourself to the hospital. Summary  Vomiting occurs when stomach contents are thrown up and out of the mouth. Vomiting can cause you to become dehydrated. Older adults and people who have other diseases or a weak immune system are at higher risk for dehydration.  It is important to treat vomiting as told by your health care provider. Follow your health care provider's instructions about eating and drinking.  Wash your hands often using  soap and water. If soap and water are not available, use hand sanitizer. Make sure that everyone in your household washes their hands frequently.  Watch your condition for any changes and for signs of dehydration.  Keep all follow-up visits as told by your health care provider. This is important. This information is not intended to replace advice given to you by your health care provider. Make sure you discuss any questions you have with your health care  provider. Document Revised: 07/10/2018 Document Reviewed: 07/01/2017 Elsevier Patient Education  2020 [employer]. -Keep appointment at the [facility] on June 14.  -Keep appt for ultrasound on June 17  -I will send a message to the [facility] for you to have appt with a high risk doctor.   If she starts having contractions, come back to hospital.  -If she starts to have blurry vision, floating spots in her vision, bad headache, sudden all over swelling in your body, RUQ come back to hosptial

## 2019-07-15 NOTE — MAU Provider Note (Addendum)
Patient Christie Parker is a G1P0 at [redacted]w[redacted]d here from [facility] after 6/8 BPP in [facility]. Her pregnancy is complicated by late to prenatal care and IUGR. Patient  Interpreter Christie Parker # [phone] used.   She denies blurry vision, headaches, LOF, vaginal bleeding, contractions, pain with urination. She feels the baby moving strongly now.   The last time she ate was 8 am (she had tea and stuffed flatbread). Last time she had something to drink was at 8 am.    Upon presentation in MAU today for extended monitoring, patient was found to have elevated BPs so pre-e work-up was performed.  History     CSN: [id number]  Arrival date and time: 07/15/19 1110   First Provider Initiated Contact with Patient 07/15/19 1158      Chief Complaint  Patient presents with  . sent from [facility] for BPP 6 out of 10   Hypertension This is a new problem. The current episode started today. Pertinent negatives include no blurred vision or shortness of breath. There are no known risk factors for coronary artery disease.  She denies taking any medication. She denies any fever.   OB History    Gravida  1   Para      Term      Preterm      AB      Living        SAB      TAB      Ectopic      Multiple      Live Births              Past Medical History:  Diagnosis Date  . Medical history non-contributory     Past Surgical History:  Procedure Laterality Date  . NO PAST SURGERIES      History reviewed. No pertinent family history.  Social History   Tobacco Use  . Smoking status: Never Smoker  . Smokeless tobacco: Never Used  Vaping Use  . Vaping Use: Never used  Substance Use Topics  . Alcohol use: Never  . Drug use: Never    Allergies: No Known Allergies  Medications Prior to Admission  Medication Sig Dispense Refill Last Dose  . Prenatal Vit-Fe Fumarate-FA (PRENATAL VITAMIN PO) Take by mouth.   07/14/2019 at Unknown time    Review of Systems  Eyes: Negative for blurred vision.  Respiratory:  Negative for shortness of breath.    Physical Exam   Blood pressure 138/82, pulse (!) 135, temperature 98.3 F (36.8 C), temperature source Oral, resp. rate 17, last menstrual period 11/03/2018, SpO2 100 %.  Physical Exam  Respiratory: Effort normal.  GI: Normal appearance.  Genitourinary:    Vulva normal.   Musculoskeletal:        General: Normal range of motion.  Neurological: She is alert.  Skin: Skin is warm.   Patient Vitals for the past 24 hrs:  BP Temp Temp src Pulse Resp SpO2  07/15/19 1652 -- -- -- (!) 135 -- --  07/15/19 1644 -- -- -- (!) 140 -- --  07/15/19 1629 -- -- -- (!) 150 -- --  07/15/19 1625 -- -- -- -- -- 100 %  07/15/19 1616 138/82 -- -- (!) 136 -- --  07/15/19 1600 127/78 -- -- (!) 114 -- --  07/15/19 1558 127/78 -- -- -- -- --  07/15/19 1523 133/83 -- -- (!) 117 -- --  07/15/19 1430 (!) 116/59 -- -- (!) 126 -- 100 %  07/15/19 1353 129/87 -- --  86 -- --  07/15/19 1330 (!) 143/91 -- -- 92 -- --  07/15/19 1316 125/76 -- -- 92 -- --  07/15/19 1300 (!) 142/92 -- -- 82 -- --  07/15/19 1245 140/89 -- -- 89 -- --  07/15/19 1230 (!) 156/106 -- -- 95 -- --  07/15/19 1215 (!) 143/93 -- -- 92 -- --  07/15/19 1200 (!) 134/91 -- -- 93 -- --  07/15/19 1146 (!) 141/92 -- -- 100 -- --  07/15/19 1133 (!) 140/93 98.3 F (36.8 C) Oral -- 17 98 %    MAU Course  Procedures  MDM -NST; 140 bpm, mod var, present acel, no decels, q 4 min contractions that patient does not feel.  -BPs slightly elevated; will draw pre-e labs> pre-e labs although PCR slightly elevated at 0.26  -1355: IV started, procardia x 1  Patient had two more doses of procardia, last dose at 1545; 2nd bag of fluid hung.   BPP in MAU is 8/8; patient has elevated heart rate and reports anxiety; will offer meal and vistaril.   1615: patient cervix is unchanged. Diet and vistaril order placed.   EKG shows sinus tachycardia; will give crackers, vistaril, diet.   Patient's cervix is unchanged while  in MAU.   Patient denies contractions.   Pulse now 112, patient has some back pain (5/10), small headache. She has not eaten all day. patient is hungry and denies discharge.   1915: Patient reports strong back pain; feels that she is unable to leave. Her husband is on the bus to come and get her. She refuses rice and is asking for a banana.    2000: patient is agitated; complaining of nausea and pain. -recheck vitals: pulse is 107; bp is 158/97 -patient refusing monitoring; husband is at the bedside.   -will offer Zofran, pepcid,    Patient care endorsed to [place], [place].    Assessment and Plan  -keep appt at [facility] on 6-14; however, patient is high risk; message sent to [facility] to have patient start care with MD.   -return precuations-explained that her blood pressure is sometimes high, she will need to see high risk doctor, return if contracitons, blurry vision, HA, RUQ,  Marylene Land 07/15/2019, 5:13 PM   Assumed care Patient vomited and is c/o upper abdominal pain  Pain is colicky and comes in waves Levsin and Mylicon given Ordered a RUQ ultrasound to rule out cholelithiasis    US ABDOMEN LIMITED RUQ  Result Date: 07/15/2019 CLINICAL DATA:  24 year old female with RIGHT UPPER quadrant pain and vomiting. EXAM: ULTRASOUND ABDOMEN LIMITED RIGHT UPPER QUADRANT COMPARISON:  None. FINDINGS: Gallbladder: Equivocal mild focal gallbladder wall thickening adjacent to the liver is noted. There is no evidence of sonographic Murphy sign or cholelithiasis. Common bile duct: Diameter: 5 mm. No intrahepatic or extrahepatic biliary dilatation noted. Liver: No focal lesion identified. Within normal limits in parenchymal echogenicity. Portal vein is patent on color Doppler imaging with normal direction of blood flow towards the liver. Other: None. IMPRESSION: 1. Equivocal focal gallbladder wall thickening adjacent to the liver. Given no sonographic Murphy sign or evidence of cholelithiasis,  this is not felt to represent acute cholecystitis. Consider ultrasound follow-up as clinically indicated. 2. No other significant abnormalities. Electronically Signed   By: Harmon Pier M.D.   On: 07/15/2019 21:53   Reviewed [place] results are normal but may reflect some inflammation of gallbladder Feels better after meds and wants to go home  Discharge home Rx Mylicon for  home use prn Rx Zofran for nausea at home  Encouraged to return here or to other Urgent Care/ED if she develops worsening of symptoms, increase in pain, fever, or other concerning symptoms.    Seabron Spates, CNM

## 2019-07-15 NOTE — Progress Notes (Signed)
Pt sent to MAU for further testing after BPP 6/10 in [facility] today per Dr. Grace Bushy.  Report called to [facility] [place], RN.

## 2019-07-15 NOTE — MAU Note (Signed)
Pt states she was sent over from [facility]FM due to having a BPP that was 6/10. Denies LOF/VB/pain. Reports good fetal movement today but not much since after the ultrasound. Reports feeling baby move some since being put on the monitor in MAU. Speaks Urdu.

## 2019-07-16 DIAGNOSIS — O139 Gestational [pregnancy-induced] hypertension without significant proteinuria, unspecified trimester: Secondary | ICD-10-CM

## 2019-07-16 DIAGNOSIS — Z8759 Personal history of other complications of pregnancy, childbirth and the puerperium: Secondary | ICD-10-CM

## 2019-07-16 NOTE — MAU Provider Note (Signed)
Chief Complaint:  sent from MFM for BPP 6 out of 10   First Provider Initiated Contact with Christie Parker 07/15/19 1158     HPI: Christie Parker is a 24 y.o. G1P0 at [redacted]w[redacted]d who presents to maternity admissions upon suggestion from MFM U/S provider due to IUGR and BPP score of 6 out of 10. Additionally, Christie Parker is presenting with elevated BP readings, with most recent being 143/93. Christie Parker is not endorsing any headaches,  RUQ pain, blurry vision, spots in visual fields, dizziness, n/v, SOB, and chest discomfort. Christie Parker endorses swelling and joint pain of right hand that began 3-4 days ago. Christie Parker denies dysuria. Denies contractions, leakage of fluid or vaginal bleeding. Good fetal movement.   Pregnancy Course:  Christie Parker had a positive urine pregnancy test in [place] but did not establish prenatal care at that time. At approx, 2.5 months into pregnancy Christie Parker visited physician in [place] for concerns about vaginal bleeding and thought she may be having a miscarriage. At that point she was told she and baby are healthy. Christie Parker immigrated to the [place] in January (when Christie Parker was 3.5 months pregnant). Christie Parker attempted to establish prenatal care at this point in the [place] but was unable to do so due to lack of insurance and immigration status. Christie Parker now has established prenatal care.   Past Medical History: Past Medical History:  Diagnosis Date  . Medical history non-contributory     Past obstetric history: OB History  Gravida Para Term Preterm AB Living  1            SAB TAB Ectopic Multiple Live Births               # Outcome Date GA Lbr Len/2nd Weight Sex Delivery Anes PTL Lv  1 Current             Past Surgical History: Past Surgical History:  Procedure Laterality Date  . NO PAST SURGERIES       Family History: History reviewed. No pertinent family history.  Social History: Social History   Tobacco Use  . Smoking status: Never Smoker  . Smokeless tobacco: Never Used  Vaping Use  .  Vaping Use: Never used  Substance Use Topics  . Alcohol use: Never  . Drug use: Never    Allergies: No Known Allergies  Meds:  No medications prior to admission.    I have reviewed Christie Parker's Past Medical Hx, Surgical Hx, Family Hx, Social Hx, medications and allergies.   ROS:  Through ROS negative except for items mentioned in HPI.  Physical Exam   Christie Parker Vitals for the past 24 hrs:  BP Temp Temp src Pulse Resp SpO2  07/15/19 2304 136/84 -- Oral (!) 104 16 --  07/15/19 2019 -- 98.1 F (36.7 C) Oral -- -- --  07/15/19 2015 -- -- -- -- -- 100 %  07/15/19 2010 (!) 158/94 -- -- (!) 102 -- 100 %  07/15/19 1720 135/77 -- -- (!) 143 -- --  07/15/19 1710 -- -- -- -- -- 99 %  07/15/19 1705 -- -- -- -- -- 99 %  07/15/19 1700 -- -- -- -- -- 99 %  07/15/19 1655 -- -- -- -- -- 100 %  07/15/19 1652 -- -- -- (!) 135 -- --  07/15/19 1644 -- -- -- (!) 140 -- --  07/15/19 1629 -- -- -- (!) 150 -- --  07/15/19 1625 -- -- -- -- -- 100 %  07/15/19 1616 138/82 -- -- (!) 136 -- --  07/15/19 1600 127/78 -- -- (!) 114 -- --  07/15/19 1558 127/78 -- -- -- -- --  07/15/19 1523 133/83 -- -- (!) 117 -- --  07/15/19 1430 (!) 116/59 -- -- (!) 126 -- 100 %  07/15/19 1353 129/87 -- -- 86 -- --  07/15/19 1330 (!) 143/91 -- -- 92 -- --  07/15/19 1316 125/76 -- -- 92 -- --  07/15/19 1300 (!) 142/92 -- -- 82 -- --  07/15/19 1245 140/89 -- -- 89 -- --  07/15/19 1230 (!) 156/106 -- -- 95 -- --  07/15/19 1215 (!) 143/93 -- -- 92 -- --  07/15/19 1200 (!) 134/91 -- -- 93 -- --  07/15/19 1146 (!) 141/92 -- -- 100 -- --  07/15/19 1133 (!) 140/93 98.3 F (36.8 C) Oral -- 17 98 %   Constitutional: Well-developed, well-nourished female in no acute distress.  Cardiovascular: normal rate Respiratory: normal effort GI: Abd soft, non-tender, gravid appropriate for gestational age. MS: Extremities nontender, no edema, normal ROM Neurologic: Alert and oriented x 4. No gross neurological deficits. Pelvic Exam:  deferred FHT:  Baseline 145 Bpm , good variability, accelerations present, no decelerations Contractions: irregular and few   Labs: Results for orders placed or performed during the hospital encounter of 07/15/19 (from the past 24 hour(s))  Protein / creatinine ratio, urine     Status: Abnormal   Collection Time: 07/15/19 11:50 AM  Result Value Ref Range   Creatinine, Urine 53.22 mg/dL   Total Protein, Urine 14 mg/dL   Protein Creatinine Ratio 0.26 (H) 0.00 - 0.15 mg/mg[Cre]  CBC     Status: Abnormal   Collection Time: 07/15/19 12:16 PM  Result Value Ref Range   WBC 8.3 4.0 - 10.5 K/uL   RBC 3.90 3.87 - 5.11 MIL/uL   Hemoglobin 11.9 (L) 12.0 - 15.0 g/dL   HCT 13.0 (L) 36 - 46 %   MCV 91.8 80.0 - 100.0 fL   MCH 30.5 26.0 - 34.0 pg   MCHC 33.2 30.0 - 36.0 g/dL   RDW 86.5 78.4 - 69.6 %   Platelets 231 150 - 400 K/uL   nRBC 0.0 0.0 - 0.2 %  Comprehensive metabolic panel     Status: Abnormal   Collection Time: 07/15/19 12:16 PM  Result Value Ref Range   Sodium 136 135 - 145 mmol/L   Potassium 3.9 3.5 - 5.1 mmol/L   Chloride 106 98 - 111 mmol/L   CO2 19 (L) 22 - 32 mmol/L   Glucose, Bld 90 70 - 99 mg/dL   BUN <5 (L) 6 - 20 mg/dL   Creatinine, Ser 2.95 0.44 - 1.00 mg/dL   Calcium 8.6 (L) 8.9 - 10.3 mg/dL   Total Protein 6.2 (L) 6.5 - 8.1 g/dL   Albumin 2.5 (L) 3.5 - 5.0 g/dL   AST 16 15 - 41 U/L   ALT 14 0 - 44 U/L   Alkaline Phosphatase 135 (H) 38 - 126 U/L   Total Bilirubin 0.5 0.3 - 1.2 mg/dL   GFR calc non Af Amer >60 >60 mL/min   GFR calc Af Amer >60 >60 mL/min   Anion gap 11 5 - 15  Glucose, capillary     Status: None   Collection Time: 07/15/19  8:06 PM  Result Value Ref Range   Glucose-Capillary 96 70 - 99 mg/dL    Imaging:  [place] MFM FETAL BPP W/NONSTRESS  Result Date: 07/15/2019 ----------------------------------------------------------------------  OBSTETRICS REPORT                         (  Signed Final 07/15/2019 10:40 am)  ---------------------------------------------------------------------- Christie Parker Info  ID #:        [id number]                          D.O.B.:  1995/10/27 (24 yrs)  Name:        Christie Parker                      Visit Date: 07/15/2019 09:07 am ---------------------------------------------------------------------- Performed By  Attending:         Lin Landsman      Ref. Address:      [address]                     [place]                                        [place], [place]                                                               [address]  Performed By:      Eden Lathe BS      [facility]                                  [facility]  Referred By:       [facility]-                     Faculty Physician ---------------------------------------------------------------------- Orders  #   Description                          Code         Ordered By  1   [place] MFM UA CORD DOPPLER               76820.02     [facility]  2   [place] MFM OB FOLLOW UP                  19147.82     RAVI SHANKAR  3   [place] MFM FETAL BPP W/NONSTRESS         95621.3      RAVI SHANKAR ----------------------------------------------------------------------  #   Order #  Accession #                 Episode #  1   [id number]                  [id number]                  [id number]  2   [id number]                  [id number]                  [id number]  3   [id number]                  [id number]                  [id number] ---------------------------------------------------------------------- Indications  Encounter for other antenatal screening         Z36.2  follow-up  Maternal care for known or suspected poor       O36.5930  fetal growth, third trimester, not applicable or  unspecified IUGR  Abnormal fetal ultrasound (elevated UA          O28.9  Doppler)  Late to prenatal care, third  trimester          O09.33  [redacted] weeks gestation of pregnancy                 Z3A.34 ---------------------------------------------------------------------- Fetal Evaluation  Num Of Fetuses:          1  Fetal Heart Rate(bpm):   141  Cardiac Activity:        Observed  Presentation:            Cephalic  Placenta:                Anterior  P. Cord Insertion:       Visualized  Amniotic Fluid  AFI FV:      Within normal limits  AFI Sum(cm)     %Tile       Largest Pocket(cm)  16.9            62          6.5  RUQ(cm)       RLQ(cm)        LUQ(cm)        LLQ(cm)  3.1           2.8            6.5            4.5 ---------------------------------------------------------------------- Biophysical Evaluation  Amniotic F.V:   Within normal limits        F. Tone:         Not Observed  F. Movement:    Not Observed                N.S.T:           Reactive  F. Breathing:   Observed                    Score:           6/10 ---------------------------------------------------------------------- Biometry  BPD:      81.8   mm     G. Age:  32w 6d         11  %    CI:         75.47  %    70 - 86  FL/HC:       20.8  %    19.4 - 21.8  HC:      298.6   mm     G. Age:  33w 1d        2.4  %    HC/AC:       1.10       0.96 - 1.11  AC:      271.5   mm     G. Age:  31w 2d        < 1  %    FL/BPD:      76.0  %    71 - 87  FL:       62.2   mm     G. Age:  32w 2d        3.5  %    FL/AC:       22.9  %    20 - 24  HUM:      53.6   mm     G. Age:  31w 1d        < 5  %  Est. FW:    1853   gm      4 lb 1 oz    2.4  % ---------------------------------------------------------------------- OB History  Gravidity:     1         Term:  0          Prem:  0        SAB:   0  TOP:           0       Ectopic: 0         Living: 0 ---------------------------------------------------------------------- Gestational Age  LMP:            36w 2d       Date:  11/03/18                   EDD:  08/10/19  U/S Today:      32w 3d                                          EDD:  09/06/19  Best:           34w 3d    Det. By:  Previous Ultrasound        EDD:  08/23/19                                      (06/07/19) ---------------------------------------------------------------------- Anatomy  Cranium:                Appears normal         Aortic Arch:            Appears normal  Cavum:                  Appears normal         Ductal Arch:            Not well visualized  Ventricles:             Appears normal         Diaphragm:              Appears normal  Choroid Plexus:  Appears normal         Stomach:                Appears normal, left                                                                         sided  Cerebellum:             Appears normal         Abdomen:                Appears normal  Posterior Fossa:        Previously seen        Abdominal Wall:         Previously seen  Nuchal Fold:            Not applicable (>20    Cord Vessels:           Previously seen                          wks GA)  Face:                   Appears normal         Kidneys:                Appear normal                          (orbits and profile)  Lips:                   Appears normal         Bladder:                Appears normal  Thoracic:               Previously seen        Spine:                  Previously seen  Heart:                  Appears normal         Upper Extremities:      Previously seen                          (4CH, axis, and situs)  RVOT:                   Appears normal         Lower Extremities:      Previously seen  LVOT:                   Appears normal  Other:   Heels/feet prev visualized. Nasal bone prev visualized. ---------------------------------------------------------------------- Doppler - Fetal Vessels  Umbilical Artery    S/D     %tile  ADFV    RDFV     3.3       88                                                 No      No  ---------------------------------------------------------------------- Impression  Follow up growth given IUGR  Normal interval growth with measurements consistent with  dates.  Biophysical profile 6/10 with a reactive NST  UA Dopplers are normal  I discussed today's findings via interpreter with Christie Parker and  her significant other (Via phone call). I recommend further  monitoring at the MAU NST with BPP in 4-6 hours. I discussed  this plan with Dr. Ladean Raya. ---------------------------------------------------------------------- Recommendations  Continue weekly testing with UA Dopplers  Delivery at 37 weeks given EFW 3%. ----------------------------------------------------------------------               Lin Landsman, MD Electronically Signed Final Report   07/15/2019 10:40 am ----------------------------------------------------------------------  [place] MFM FETAL BPP W/NONSTRESS  Result Date: 07/08/2019 ----------------------------------------------------------------------  OBSTETRICS REPORT                         (Signed Final 07/08/2019 05:12 pm) ---------------------------------------------------------------------- Christie Parker Info  ID #:        [id number]                          D.O.B.:  1995/07/26 (24 yrs)  Name:        Christie Parker                      Visit Date: 07/08/2019 04:00 pm ---------------------------------------------------------------------- Performed By  Attending:         Lin Landsman      Ref. Address:      [address]                     [place]                                        [place], [place]                                                               [address]  Performed By:      Reinaldo Raddle            [facility]  Referred By:       [facility]-                     Faculty Physician  ---------------------------------------------------------------------- Orders  #   Description                          Code         Ordered By  1   [place] MFM FETAL BPP W/NONSTRESS         [phone]      Noralee Space  2   [place] MFM UA CORD DOPPLER               76820.02     [facility] ----------------------------------------------------------------------  #   Order #                    Accession #                 Episode #  1   [id number]                  [id number]                  [id number]  2   [id number]                  [id number]                  [id number] ---------------------------------------------------------------------- Indications  [redacted] weeks gestation of pregnancy                 Z3A.33  Encounter for antenatal screening for           Z36.3  malformations  Maternal care for known or suspected poor       O36.5930  fetal growth, third trimester, not applicable or  unspecified IUGR  Abnormal fetal ultrasound (elevated UA          O28.9  Doppler)  Late to prenatal care, third trimester          O09.33 ---------------------------------------------------------------------- Fetal Evaluation  Num Of Fetuses:          1  Fetal Heart Rate(bpm):   138  Cardiac Activity:        Observed  Presentation:            Cephalic  Placenta:                Anterior  P. Cord Insertion:       Previously Visualized  Amniotic Fluid  AFI FV:      Within normal limits  AFI Sum(cm)     %Tile       Largest Pocket(cm)  14.2            50          4.2  RUQ(cm)       RLQ(cm)        LUQ(cm)        LLQ(cm)  4.2           2.6            3.9            3.5 ---------------------------------------------------------------------- Biophysical Evaluation  Amniotic F.V:   Pocket => 2 cm  F. Tone:         Observed  F. Movement:    Observed                    Score:           8/8  F. Breathing:   Observed ---------------------------------------------------------------------- Biometry  LV:         4.9  mm  ---------------------------------------------------------------------- OB History  Gravidity:     1         Term:  0          Prem:  0        SAB:   0  TOP:           0       Ectopic: 0         Living: 0 ---------------------------------------------------------------------- Gestational Age  LMP:            35w 2d       Date:  11/03/18                   EDD:  08/10/19  Best:           33w 3d    Det. By:  Previous Ultrasound        EDD:  08/23/19                                      (06/07/19) ---------------------------------------------------------------------- Anatomy  Cranium:                Previously seen        Aortic Arch:            Previously seen  Cavum:                  Previously seen        Ductal Arch:            Not well visualized  Ventricles:             Appears normal         Diaphragm:              Appears normal  Choroid Plexus:         Previously seen        Stomach:                Appears normal, left                                                                         sided  Cerebellum:             Previously seen        Abdomen:                Appears normal  Posterior Fossa:        Previously seen        Abdominal Wall:         Previously seen  Nuchal Fold:            Not applicable (>20    Cord Vessels:  Appears normal ([redacted]                          wks GA)                                        vessel cord)  Face:                   Orbits and profile     Kidneys:                Appear normal                          previously seen  Lips:                   Previously seen        Bladder:                Appears normal  Thoracic:               Previously seen        Spine:                  Previously seen  Heart:                  Previously seen        Upper Extremities:      Previously seen  RVOT:                   Previously seen        Lower Extremities:      Previously seen  LVOT:                   Previously seen  Other:   Heels/feet prev visualized. Nasal bone prev visualized.  ---------------------------------------------------------------------- Doppler - Fetal Vessels  Umbilical Artery    S/D     %tile   3.64        93 ---------------------------------------------------------------------- Cervix Uterus Adnexa  Cervix  Not visualized (advanced GA >24wks) ---------------------------------------------------------------------- Impression  Antenatal testing performed due to SGA with abnormal UA  Dopplers  Biophyiscal profile 8/8 with reactive NST  Normal UA Dopplers today  Good fetal movement and amniotic fluid ---------------------------------------------------------------------- Recommendations  Growth and BPP scheduled in 1 week ----------------------------------------------------------------------               Lin Landsman, MD Electronically Signed Final Report   07/08/2019 05:12 pm ----------------------------------------------------------------------  [place] MFM FETAL BPP W/NONSTRESS  Result Date: 06/23/2019 ----------------------------------------------------------------------  OBSTETRICS REPORT                    (Corrected Final 06/23/2019 05:22 pm) ---------------------------------------------------------------------- Christie Parker Info  ID #:       [id number]                          D.O.B.:  1995/07/31 (24 yrs)  Name:       Christie Parker                      Visit Date: 06/23/2019 01:46 pm ---------------------------------------------------------------------- Performed By  Attending:        Noralee Space MD        Ref. Address:  [address]                                                             [place], [place]                                                             [address]  Performed By:     Eden Lathe BS      Location[facility]                                 Fetal Care  Referred By:      [facility]-                     Faculty Physician ---------------------------------------------------------------------- Orders  #  Description                           Code        Ordered By  1  [place] MFM OB DETAIL +14 WK               [id number]    UGONNA ANYANWU  2  [place] MFM FETAL BPP                      76818.5     UGONNA ANYANWU     W/NONSTRESS  3  [place] MFM UA CORD DOPPLER                91478.29    Jaynie Collins ----------------------------------------------------------------------  #  Order #                     Accession #                Episode #  1  [id number]                   [id number]                 [id number]  2  [id number]                   [id number]                 [id number]  3  [id number]  [id number]                 [id number] ---------------------------------------------------------------------- Indications  Encounter for antenatal screening for          Z36.3  malformations  Maternal care for known or suspected poor      O36.5930  fetal growth, third trimester, not applicable or  unspecified IUGR  Abnormal fetal ultrasound (elevated UA         O28.9  Doppler)  Late to prenatal care, third trimester         O09.33  [redacted] weeks gestation of pregnancy                Z3A.31 ---------------------------------------------------------------------- Fetal Evaluation  Num Of Fetuses:         1  Fetal Heart Rate(bpm):  138  Cardiac Activity:       Observed  Presentation:           Cephalic  Placenta:               Anterior  P. Cord Insertion:      Visualized  Amniotic Fluid  AFI FV:      Within normal limits  AFI Sum(cm)     %Tile       Largest Pocket(cm)  19.             72          6.5  RUQ(cm)       RLQ(cm)       LUQ(cm)        LLQ(cm)  3.4           6.5           4.2            4.9 ---------------------------------------------------------------------- Biophysical Evaluation  Amniotic F.V:   Within normal limits       F. Tone:        Observed  F. Movement:    Observed                   N.S.T:          Reactive  F. Breathing:    Observed                   Score:          10/10 ---------------------------------------------------------------------- Biometry  BPD:      78.1  mm     G. Age:  31w 2d         41  %    CI:        75.19   %    70 - 86                                                          FL/HC:      20.5   %    19.3 - 21.3  HC:      285.7  mm     G. Age:  31w 3d         17  %    HC/AC:      1.11        0.96 - 1.17  AC:      256.4  mm     G. Age:  29w 6d  11  %    FL/BPD:     74.9   %    71 - 87  FL:       58.5  mm     G. Age:  30w 4d         18  %    FL/AC:      22.8   %    20 - 24  HUM:      50.1  mm     G. Age:  29w 3d         11  %  CER:      41.5  mm     G. Age:  35w 4d       > 95  %  Est. FW:    1551  gm      3 lb 7 oz     13  % ---------------------------------------------------------------------- OB History  Gravidity:    1         Term:   0        Prem:   0        SAB:   0  TOP:          0       Ectopic:  0        Living: 0 ---------------------------------------------------------------------- Gestational Age  LMP:           33w 1d        Date:  11/03/18                 EDD:   08/10/19  U/S Today:     30w 6d                                        EDD:   08/26/19  Best:          31w 2d     Det. By:  Previous Ultrasound      EDD:   08/23/19                                      (06/07/19) ---------------------------------------------------------------------- Anatomy  Cranium:               Appears normal         Aortic Arch:            Appears normal  Cavum:                 Appears normal         Ductal Arch:            Not well visualized  Ventricles:            Appears normal         Diaphragm:              Appears normal  Choroid Plexus:        Appears normal         Stomach:                Appears normal, left  sided  Cerebellum:            Appears normal         Abdomen:                Appears normal  Posterior Fossa:       Appears normal          Abdominal Wall:         Appears nml (cord                                                                        insert, abd wall)  Nuchal Fold:           Not applicable (>20    Cord Vessels:           Appears normal ([redacted]                         wks GA)                                        vessel cord)  Face:                  Appears normal         Kidneys:                Appear normal                         (orbits and profile)  Lips:                  Appears normal         Bladder:                Appears normal  Thoracic:              Appears normal         Spine:                  Appears normal  Heart:                 Appears normal         Upper Extremities:      Appears normal                         (4CH, axis, and                         situs)  RVOT:                  Appears normal         Lower Extremities:      Appears normal  LVOT:                  Appears normal  Other:  Heels/feet visualized. Nasal bone visualized. ---------------------------------------------------------------------- Doppler - Fetal Vessels  Umbilical Artery   S/D     %tile      RI  ADFV    RDFV   4.75   > 97.5    0.79                                         No      No ---------------------------------------------------------------------- Cervix Uterus Adnexa  Cervix  Not visualized (advanced GA >24wks)  Uterus  No abnormality visualized.  Right Ovary  Within normal limits.  Left Ovary  Within normal limits.  Cul De Sac  No free fluid seen.  Adnexa  No abnormality visualized. ---------------------------------------------------------------------- Impression  Christie Parker, G1 P0 with uncertain GA, is here for ultrasound  evaluation. On your office ultrasound performed on 06/07/19,  fetal growth restriction was suspected.  Christie Parker had one first-trimester ultrasound in [place] and was  told that her EDD is 08/25/19. Christie Parker had noted in her phone  but no report is available for our review.  EDD by  ultrasound  performed on 06/07/19 ([facility]) is 08/23/19. EDD by her  LMP date (?unsure) is 08/10/19.  Based on her history, I feel it is reasonable to establish her  EDD based on ultrasound performed on 06/07/19 at  [facility] i.e. 08/23/19. She is 31w 2d pregnant  today.  On today's ultrasound, amniotic fluid is normal and good fetal  activity is seen. The estimated fetal weight is at the 13th  percentile and AC measures at the 11th percentile. Amniotic  fluid is normal and good fetal activity is seen. Fetal anatomy  appears normal but limited by advanced gestational age.  Because of suspicion of fetal growth restriction (considering  her uncertain dates and late ultrasound estimation of EDD),  we performed umbilical artery Doppler that showed increased  S/D ratio. BPP 10/10.  NST is reactive.  I explained the findings and that her EDD is amended to  08/23/19. I explained the possibility of fetal growth restriction  (placental insufficiency is the most likely cause).  Christie Parker informed that it will not be possible for her to come for  weekly ultrasound assessments. ---------------------------------------------------------------------- Recommendations  -An appointment was made for her to return in 2 weeks for  umbilical artery Doppler and NST.  -Fetal growth in 3 weeks. ----------------------------------------------------------------------                       Noralee Space, MD Electronically Signed Corrected Final Report  06/23/2019 05:22 pm ----------------------------------------------------------------------  [place] MFM Fetal BPP Wo Non Stress  Result Date: 07/15/2019 ----------------------------------------------------------------------  OBSTETRICS REPORT                         (Signed Final 07/15/2019 04:48 pm) ---------------------------------------------------------------------- Christie Parker Info  ID #:        [id number]                          D.O.B.:  1995-03-04 (24 yrs)  Name:        Christie Parker                       Visit Date: 07/15/2019 03:19 pm ---------------------------------------------------------------------- Performed By  Attending:         Lin Landsman      Ref. Address:      [address]  [address]                                        [place], [place]                                                               [address]  Performed By:      Sandi Mealy        [facility]  Referred By:       [facility]-                     Faculty Physician ---------------------------------------------------------------------- Orders  #   Description                          Code         Ordered By  1   [place] MFM FETAL BPP WO NON              21308.65     Luna Kitchens      STRESS ----------------------------------------------------------------------  #   Order #                    Accession #                 Episode #  1   [id number]                  [id number]                  [id number] ---------------------------------------------------------------------- Indications  Maternal care for known or suspected poor       O36.5930  fetal growth, third trimester, not applicable or  unspecified IUGR  Abnormal fetal ultrasound (elevated UA          O28.9  Doppler)  Late to prenatal care, third trimester          O09.33  [redacted] weeks gestation of pregnancy                 Z3A.34 ---------------------------------------------------------------------- Fetal Evaluation  Num Of Fetuses:          1  Fetal Heart Rate(bpm):   145  Cardiac Activity:        Observed  Presentation:            Cephalic  Placenta:                Anterior  P. Cord Insertion:       Visualized  Amniotic Fluid  AFI FV:      Within normal limits  AFI Sum(cm)     %Tile       Largest Pocket(cm)  18.1            67          7.5  RUQ(cm)        RLQ(cm)        LUQ(cm)        LLQ(cm)  6.2           1.8            2.6            7.5 ---------------------------------------------------------------------- Biophysical Evaluation  Amniotic F.V:   Within normal limits        F. Tone:         Observed  F. Movement:    Observed                    Score:           8/8  F. Breathing:   Observed ---------------------------------------------------------------------- Biometry  LV:         4.9  mm ---------------------------------------------------------------------- OB History  Gravidity:     1         Term:  0          Prem:  0        SAB:   0  TOP:           0       Ectopic: 0         Living: 0 ---------------------------------------------------------------------- Gestational Age  LMP:            36w 2d       Date:  11/03/18                   EDD:  08/10/19  Best:           34w 3d    Det. By:  Previous Ultrasound        EDD:  08/23/19                                      (06/07/19) ---------------------------------------------------------------------- Anatomy  Ventricles:             Appears normal         Stomach:                Appears normal, left                                                                         sided  Thoracic:               Appears normal         Abdomen:  Appears normal  Heart:                  Appears normal         Kidneys:                Appear normal                          (4CH, axis, and situs)  Diaphragm:              Appears normal         Bladder:                Appears normal ---------------------------------------------------------------------- Impression  Follow up antenatal testing given BPP of 6/10  After monitoring a BPP is 8/8  Good amniotic fluid and fetal movement. ---------------------------------------------------------------------- Recommendations  Continue weekly testing as previously scheduled. ----------------------------------------------------------------------               Lin Landsman, MD  Electronically Signed Final Report   07/15/2019 04:48 pm ----------------------------------------------------------------------  [place] MFM OB DETAIL +14 WK  Result Date: 06/23/2019 ----------------------------------------------------------------------  OBSTETRICS REPORT                    (Corrected Final 06/23/2019 05:22 pm) ---------------------------------------------------------------------- Christie Parker Info  ID #:       [id number]                          D.O.B.:  Jan 28, 1996 (24 yrs)  Name:       Christie Parker                      Visit Date: 06/23/2019 01:46 pm ---------------------------------------------------------------------- Performed By  Attending:        Noralee Space MD        Ref. Address:     [address]                                                             [place], [place]                                                             [address]  Performed By:     Eden Lathe BS      [facility]  Referred By:      Mordecai Maes                    [facility]-                    Faculty Physician ---------------------------------------------------------------------- Orders  #  Description                           Code        Ordered By  1  [place] MFM OB DETAIL +14 WK               [id number]    Jaynie Collins  2  [place] MFM FETAL BPP                      76818.5     UGONNA ANYANWU     W/NONSTRESS  3  [place] MFM UA CORD DOPPLER                76820.02    UGONNA ANYANWU ----------------------------------------------------------------------  #  Order #                     Accession #                Episode #  1  [id number]                   [id number]                 [id number]  2  [id number]                   [id number]                 [id number]  3  [id number]                   [id number]                 [id number]  ---------------------------------------------------------------------- Indications  Encounter for antenatal screening for          Z36.3  malformations  Maternal care for known or suspected poor      O36.5930  fetal growth, third trimester, not applicable or  unspecified IUGR  Abnormal fetal ultrasound (elevated UA         O28.9  Doppler)  Late to prenatal care, third trimester         O09.33  [redacted] weeks gestation of pregnancy                Z3A.31 ---------------------------------------------------------------------- Fetal Evaluation  Num Of Fetuses:         1  Fetal Heart Rate(bpm):  138  Cardiac Activity:       Observed  Presentation:           Cephalic  Placenta:               Anterior  P. Cord Insertion:      Visualized  Amniotic Fluid  AFI FV:      Within normal limits  AFI Sum(cm)     %Tile       Largest Pocket(cm)  19.             72          6.5  RUQ(cm)       RLQ(cm)       LUQ(cm)  LLQ(cm)  3.4           6.5           4.2            4.9 ---------------------------------------------------------------------- Biophysical Evaluation  Amniotic F.V:   Within normal limits       F. Tone:        Observed  F. Movement:    Observed                   N.S.T:          Reactive  F. Breathing:   Observed                   Score:          10/10 ---------------------------------------------------------------------- Biometry  BPD:      78.1  mm     G. Age:  31w 2d         41  %    CI:        75.19   %    70 - 86                                                          FL/HC:      20.5   %    19.3 - 21.3  HC:      285.7  mm     G. Age:  31w 3d         17  %    HC/AC:      1.11        0.96 - 1.17  AC:      256.4  mm     G. Age:  29w 6d         11  %    FL/BPD:     74.9   %    71 - 87  FL:       58.5  mm     G. Age:  30w 4d         18  %    FL/AC:      22.8   %    20 - 24  HUM:      50.1  mm     G. Age:  29w 3d         11  %  CER:      41.5  mm     G. Age:  35w 4d       > 95  %  Est. FW:    1551  gm      3 lb 7 oz     13   % ---------------------------------------------------------------------- OB History  Gravidity:    1         Term:   0        Prem:   0        SAB:   0  TOP:          0       Ectopic:  0        Living: 0 ---------------------------------------------------------------------- Gestational Age  LMP:           33w 1d        Date:  11/03/18  EDD:   08/10/19  U/S Today:     30w 6d                                        EDD:   08/26/19  Best:          31w 2d     Det. By:  Previous Ultrasound      EDD:   08/23/19                                      (06/07/19) ---------------------------------------------------------------------- Anatomy  Cranium:               Appears normal         Aortic Arch:            Appears normal  Cavum:                 Appears normal         Ductal Arch:            Not well visualized  Ventricles:            Appears normal         Diaphragm:              Appears normal  Choroid Plexus:        Appears normal         Stomach:                Appears normal, left                                                                        sided  Cerebellum:            Appears normal         Abdomen:                Appears normal  Posterior Fossa:       Appears normal         Abdominal Wall:         Appears nml (cord                                                                        insert, abd wall)  Nuchal Fold:           Not applicable (>20    Cord Vessels:           Appears normal ([redacted]                         wks GA)  vessel cord)  Face:                  Appears normal         Kidneys:                Appear normal                         (orbits and profile)  Lips:                  Appears normal         Bladder:                Appears normal  Thoracic:              Appears normal         Spine:                  Appears normal  Heart:                 Appears normal         Upper Extremities:      Appears normal                         (4CH, axis, and                          situs)  RVOT:                  Appears normal         Lower Extremities:      Appears normal  LVOT:                  Appears normal  Other:  Heels/feet visualized. Nasal bone visualized. ---------------------------------------------------------------------- Doppler - Fetal Vessels  Umbilical Artery   S/D     %tile      RI                                      ADFV    RDFV   4.75   > 97.5    0.79                                         No      No ---------------------------------------------------------------------- Cervix Uterus Adnexa  Cervix  Not visualized (advanced GA >24wks)  Uterus  No abnormality visualized.  Right Ovary  Within normal limits.  Left Ovary  Within normal limits.  Cul De Sac  No free fluid seen.  Adnexa  No abnormality visualized. ---------------------------------------------------------------------- Impression  Christie Parker, G1 P0 with uncertain GA, is here for ultrasound  evaluation. On your office ultrasound performed on 06/07/19,  fetal growth restriction was suspected.  Christie Parker had one first-trimester ultrasound in [place] and was  told that her EDD is 08/25/19. Christie Parker had noted in her phone  but no report is available for our review.  EDD by ultrasound  performed on 06/07/19 ([facility]) is 08/23/19. EDD by her  LMP date (?unsure) is 08/10/19.  Based on her history, I feel it is reasonable to establish her  EDD based on ultrasound performed on 06/07/19 at  [facility]  i.e. 08/23/19. She is 31w 2d pregnant  today.  On today's ultrasound, amniotic fluid is normal and good fetal  activity is seen. The estimated fetal weight is at the 13th  percentile and AC measures at the 11th percentile. Amniotic  fluid is normal and good fetal activity is seen. Fetal anatomy  appears normal but limited by advanced gestational age.  Because of suspicion of fetal growth restriction (considering  her uncertain dates and late ultrasound estimation of EDD),  we performed umbilical  artery Doppler that showed increased  S/D ratio. BPP 10/10.  NST is reactive.  I explained the findings and that her EDD is amended to  08/23/19. I explained the possibility of fetal growth restriction  (placental insufficiency is the most likely cause).  Christie Parker informed that it will not be possible for her to come for  weekly ultrasound assessments. ---------------------------------------------------------------------- Recommendations  -An appointment was made for her to return in 2 weeks for  umbilical artery Doppler and NST.  -Fetal growth in 3 weeks. ----------------------------------------------------------------------                       Noralee Space, MD Electronically Signed Corrected Final Report  06/23/2019 05:22 pm ----------------------------------------------------------------------  US BREAST LTD UNI RIGHT INC AXILLA  Result Date: 06/16/2019 CLINICAL DATA:  Palpable abnormality in the RIGHT breast noted on recent physical exam. Christie Parker is 8 months pregnant. Christie Parker does not really feel a mass in the RIGHT breast. EXAM: ULTRASOUND OF THE RIGHT BREAST COMPARISON:  Baseline evaluation FINDINGS: On physical exam, I palpate a rounded mobile superficial mass in the LOWER OUTER QUADRANT of the RIGHT breast. Targeted ultrasound is performed, showing a circumscribed oval hypoechoic parallel mass in the 7 o'clock location of the RIGHT breast 4 centimeters from the nipple measuring 2.1 x 1.6 x 1.2 centimeters. Presence of internal blood flow documented by Doppler evaluation. Targeted evaluation of the RIGHT axilla shows no adenopathy. 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 We discussed management options including excision, biopsy, and close follow-up. Imaging followup is recommended at 6, 12, and 24 months to assess stability. The Christie Parker concurs with this plan. RECOMMENDATION: Recommend RIGHT breast ultrasound in 3 months following delivery. I have discussed  the findings and recommendations with the Christie Parker with the assistance of an interpreter. If applicable, a reminder letter will be sent to the Christie Parker regarding the next appointment. BI-RADS CATEGORY  3: Probably benign. Electronically Signed   By: Norva Pavlov M.D.   On: 06/16/2019 13:37   [place] MFM OB FOLLOW UP  Result Date: 07/15/2019 ----------------------------------------------------------------------  OBSTETRICS REPORT                         (Signed Final 07/15/2019 10:40 am) ---------------------------------------------------------------------- Christie Parker Info  ID #:        [id number]                          D.O.B.:  1995-10-28 (24 yrs)  Name:        Christie Parker                      Visit Date: 07/15/2019 09:07 am ---------------------------------------------------------------------- Performed By  Attending:         Lin Landsman      Ref. Address:      [address]  [address]                                        [place], [place]                                                               [address]  Performed By:      Eden Lathe BS      [facility]  Referred By:       [facility]-                     Faculty Physician ---------------------------------------------------------------------- Orders  #   Description                          Code         Ordered By  1   [place] MFM UA CORD DOPPLER               76820.02     RAVI SHANKAR  2   [place] MFM OB FOLLOW UP                  76816.01     RAVI SHANKAR  3   [place] MFM FETAL BPP W/NONSTRESS         81191.4      RAVI [facility] ----------------------------------------------------------------------  #   Order #                    Accession #                 Episode #  1   [id number]                  [id number]                  [id number]  2   [id number]                   [id number]                  [id number]  3   [id number]                  [id number]  [id number] ---------------------------------------------------------------------- Indications  Encounter for other antenatal screening         Z36.2  follow-up  Maternal care for known or suspected poor       O36.5930  fetal growth, third trimester, not applicable or  unspecified IUGR  Abnormal fetal ultrasound (elevated UA          O28.9  Doppler)  Late to prenatal care, third trimester          O09.33  [redacted] weeks gestation of pregnancy                 Z3A.34 ---------------------------------------------------------------------- Fetal Evaluation  Num Of Fetuses:          1  Fetal Heart Rate(bpm):   141  Cardiac Activity:        Observed  Presentation:            Cephalic  Placenta:                Anterior  P. Cord Insertion:       Visualized  Amniotic Fluid  AFI FV:      Within normal limits  AFI Sum(cm)     %Tile       Largest Pocket(cm)  16.9            62          6.5  RUQ(cm)       RLQ(cm)        LUQ(cm)        LLQ(cm)  3.1           2.8            6.5            4.5 ---------------------------------------------------------------------- Biophysical Evaluation  Amniotic F.V:   Within normal limits        F. Tone:         Not Observed  F. Movement:    Not Observed                N.S.T:           Reactive  F. Breathing:   Observed                    Score:           6/10 ---------------------------------------------------------------------- Biometry  BPD:      81.8   mm     G. Age:  32w 6d         11  %    CI:         75.47  %    70 - 86                                                           FL/HC:       20.8  %    19.4 - 21.8  HC:      298.6   mm     G. Age:  33w 1d        2.4  %    HC/AC:       1.10       0.96 - 1.11  AC:      271.5   mm     G. Age:  31w 2d        <  1  %    FL/BPD:      76.0  %    71 - 87  FL:       62.2   mm     G. Age:  32w 2d        3.5  %    FL/AC:       22.9  %    20 - 24  HUM:       53.6   mm     G. Age:  31w 1d        < 5  %  Est. FW:    1853   gm      4 lb 1 oz    2.4  % ---------------------------------------------------------------------- OB History  Gravidity:     1         Term:  0          Prem:  0        SAB:   0  TOP:           0       Ectopic: 0         Living: 0 ---------------------------------------------------------------------- Gestational Age  LMP:            36w 2d       Date:  11/03/18                   EDD:  08/10/19  U/S Today:      32w 3d                                         EDD:  09/06/19  Best:           34w 3d    Det. By:  Previous Ultrasound        EDD:  08/23/19                                      (06/07/19) ---------------------------------------------------------------------- Anatomy  Cranium:                Appears normal         Aortic Arch:            Appears normal  Cavum:                  Appears normal         Ductal Arch:            Not well visualized  Ventricles:             Appears normal         Diaphragm:              Appears normal  Choroid Plexus:         Appears normal         Stomach:                Appears normal, left  sided  Cerebellum:             Appears normal         Abdomen:                Appears normal  Posterior Fossa:        Previously seen        Abdominal Wall:         Previously seen  Nuchal Fold:            Not applicable (>20    Cord Vessels:           Previously seen                          wks GA)  Face:                   Appears normal         Kidneys:                Appear normal                          (orbits and profile)  Lips:                   Appears normal         Bladder:                Appears normal  Thoracic:               Previously seen        Spine:                  Previously seen  Heart:                  Appears normal         Upper Extremities:      Previously seen                          (4CH, axis, and situs)  RVOT:                    Appears normal         Lower Extremities:      Previously seen  LVOT:                   Appears normal  Other:   Heels/feet prev visualized. Nasal bone prev visualized. ---------------------------------------------------------------------- Doppler - Fetal Vessels  Umbilical Artery    S/D     %tile                                              ADFV    RDFV     3.3       88                                                 No      No ---------------------------------------------------------------------- Impression  Follow up growth given IUGR  Normal interval growth with measurements consistent with  dates.  Biophysical profile 6/10  with a reactive NST  UA Dopplers are normal  I discussed today's findings via interpreter with Christie Parker and  her significant other (Via phone call). I recommend further  monitoring at the MAU NST with BPP in 4-6 hours. I discussed  this plan with Dr. Ladean Raya. ---------------------------------------------------------------------- Recommendations  Continue weekly testing with UA Dopplers  Delivery at 37 weeks given EFW 3%. ----------------------------------------------------------------------               Lin Landsman, MD Electronically Signed Final Report   07/15/2019 10:40 am ----------------------------------------------------------------------  [place] MFM UA CORD DOPPLER  Result Date: 07/15/2019 ----------------------------------------------------------------------  OBSTETRICS REPORT                         (Signed Final 07/15/2019 10:40 am) ---------------------------------------------------------------------- Christie Parker Info  ID #:        [id number]                          D.O.B.:  1996/01/03 (24 yrs)  Name:        Christie Parker                      Visit Date: 07/15/2019 09:07 am ---------------------------------------------------------------------- Performed By  Attending:         Lin Landsman      Ref. Address:      [address]                     [place]                                         [place], [place]                                                               [address]  Performed By:      Eden Lathe BS      Location[facility]                     RDMS RVT                                  Fetal Care  Referred By:       [facility]-                     Faculty Physician ---------------------------------------------------------------------- Orders  #   Description  Code         Ordered By  1   US MFM UA CORD DOPPLER               [id number]     RAVI SHANKAR  2   US MFM OB FOLLOW UP                  76816.01     RAVI SHANKAR  3   US MFM FETAL BPP W/NONSTRESS         [id number]      [facility] ----------------------------------------------------------------------  #   Order #                    Accession #                 Episode #  1   [id number]                  [id number]                  [id number]  2   [id number]                  [id number]                  [id number]  3   [id number]                  [id number]                  [id number] ---------------------------------------------------------------------- Indications  Encounter for other antenatal screening         Z36.2  follow-up  Maternal care for known or suspected poor       O36.5930  fetal growth, third trimester, not applicable or  unspecified IUGR  Abnormal fetal ultrasound (elevated UA          O28.9  Doppler)  Late to prenatal care, third trimester          O09.33  [redacted] weeks gestation of pregnancy                 Z3A.34 ---------------------------------------------------------------------- Fetal Evaluation  Num Of Fetuses:          1  Fetal Heart Rate(bpm):   141  Cardiac Activity:        Observed  Presentation:            Cephalic  Placenta:                Anterior  P. Cord Insertion:       Visualized  Amniotic Fluid  AFI FV:      Within normal limits  AFI Sum(cm)     %Tile        Largest Pocket(cm)  16.9            62          6.5  RUQ(cm)       RLQ(cm)        LUQ(cm)        LLQ(cm)  3.1           2.8            6.5            4.5 ---------------------------------------------------------------------- Biophysical Evaluation  Amniotic F.V:   Within normal limits        F. Tone:         Not Observed  F. Movement:  Not Observed                N.S.T:           Reactive  F. Breathing:   Observed                    Score:           6/10 ---------------------------------------------------------------------- Biometry  BPD:      81.8   mm     G. Age:  32w 6d         11  %    CI:         75.47  %    70 - 86                                                           FL/HC:       20.8  %    19.4 - 21.8  HC:      298.6   mm     G. Age:  33w 1d        2.4  %    HC/AC:       1.10       0.96 - 1.11  AC:      271.5   mm     G. Age:  31w 2d        < 1  %    FL/BPD:      76.0  %    71 - 87  FL:       62.2   mm     G. Age:  32w 2d        3.5  %    FL/AC:       22.9  %    20 - 24  HUM:      53.6   mm     G. Age:  31w 1d        < 5  %  Est. FW:    1853   gm      4 lb 1 oz    2.4  % ---------------------------------------------------------------------- OB History  Gravidity:     1         Term:  0          Prem:  0        SAB:   0  TOP:           0       Ectopic: 0         Living: 0 ---------------------------------------------------------------------- Gestational Age  LMP:            36w 2d       Date:  11/03/18                   EDD:  08/10/19  U/S Today:      32w 3d                                         EDD:  09/06/19  Best:           34w 3d    Det. By:  Previous Ultrasound  EDD:  08/23/19                                      (06/07/19) ---------------------------------------------------------------------- Anatomy  Cranium:                Appears normal         Aortic Arch:            Appears normal  Cavum:                  Appears normal         Ductal Arch:            Not well visualized  Ventricles:              Appears normal         Diaphragm:              Appears normal  Choroid Plexus:         Appears normal         Stomach:                Appears normal, left                                                                         sided  Cerebellum:             Appears normal         Abdomen:                Appears normal  Posterior Fossa:        Previously seen        Abdominal Wall:         Previously seen  Nuchal Fold:            Not applicable (>20    Cord Vessels:           Previously seen                          wks GA)  Face:                   Appears normal         Kidneys:                Appear normal                          (orbits and profile)  Lips:                   Appears normal         Bladder:                Appears normal  Thoracic:               Previously seen        Spine:                  Previously seen  Heart:  Appears normal         Upper Extremities:      Previously seen                          (4CH, axis, and situs)  RVOT:                   Appears normal         Lower Extremities:      Previously seen  LVOT:                   Appears normal  Other:   Heels/feet prev visualized. Nasal bone prev visualized. ---------------------------------------------------------------------- Doppler - Fetal Vessels  Umbilical Artery    S/D     %tile                                              ADFV    RDFV     3.3       88                                                 No      No ---------------------------------------------------------------------- Impression  Follow up growth given IUGR  Normal interval growth with measurements consistent with  dates.  Biophysical profile 6/10 with a reactive NST  UA Dopplers are normal  I discussed today's findings via interpreter with Christie Parker and  her significant other (Via phone call). I recommend further  monitoring at the MAU NST with BPP in 4-6 hours. I discussed  this plan with Dr. Ladean Raya.  ---------------------------------------------------------------------- Recommendations  Continue weekly testing with UA Dopplers  Delivery at 37 weeks given EFW 3%. ----------------------------------------------------------------------               Lin Landsman, MD Electronically Signed Final Report   07/15/2019 10:40 am ----------------------------------------------------------------------  [place] MFM UA CORD DOPPLER  Result Date: 07/08/2019 ----------------------------------------------------------------------  OBSTETRICS REPORT                         (Signed Final 07/08/2019 05:12 pm) ---------------------------------------------------------------------- Christie Parker Info  ID #:        [id number]                          D.O.B.:  1996-01-13 (24 yrs)  Name:        Christie Parker                      Visit Date: 07/08/2019 04:00 pm ---------------------------------------------------------------------- Performed By  Attending:         Lin Landsman      Ref. Address:      [address]                     [place]                                        [place]  [place], [place]                                                               [address]  Performed By:      Reinaldo Raddle            Location[facility]                                                               Fetal [facility]  Referred By:       [facility]-                     Faculty Physician ---------------------------------------------------------------------- Orders  #   Description                          Code         Ordered By  1   [place] MFM FETAL BPP W/NONSTRESS         [phone]      Noralee Space  2   [place] MFM UA CORD DOPPLER               76820.02     RAVI [facility] ----------------------------------------------------------------------  #   Order #                    Accession #                 Episode #  1   [id number]                  [id number]                   [id number]  2   [id number]                  [id number]                  [id number] ---------------------------------------------------------------------- Indications  [redacted] weeks gestation of pregnancy                 Z3A.33  Encounter for antenatal screening for           Z36.3  malformations  Maternal care for known or suspected poor       O36.5930  fetal growth, third trimester, not applicable or  unspecified IUGR  Abnormal fetal ultrasound (elevated UA          O28.9  Doppler)  Late to prenatal care, third trimester          O09.33 ---------------------------------------------------------------------- Fetal Evaluation  Num Of Fetuses:          1  Fetal Heart Rate(bpm):   138  Cardiac Activity:        Observed  Presentation:            Cephalic  Placenta:                Anterior  P. Cord Insertion:       Previously Visualized  Amniotic Fluid  AFI FV:      Within normal limits  AFI Sum(cm)     %Tile       Largest Pocket(cm)  14.2            50          4.2  RUQ(cm)       RLQ(cm)        LUQ(cm)        LLQ(cm)  4.2           2.6            3.9            3.5 ---------------------------------------------------------------------- Biophysical Evaluation  Amniotic F.V:   Pocket => 2 cm              F. Tone:         Observed  F. Movement:    Observed                    Score:           8/8  F. Breathing:   Observed ---------------------------------------------------------------------- Biometry  LV:         4.9  mm ---------------------------------------------------------------------- OB History  Gravidity:     1         Term:  0          Prem:  0        SAB:   0  TOP:           0       Ectopic: 0         Living: 0 ---------------------------------------------------------------------- Gestational Age  LMP:            35w 2d       Date:  11/03/18                   EDD:  08/10/19  Best:           33w 3d    Det. By:  Previous Ultrasound        EDD:  08/23/19                                      (06/07/19)  ---------------------------------------------------------------------- Anatomy  Cranium:                Previously seen        Aortic Arch:            Previously seen  Cavum:                  Previously seen        Ductal Arch:            Not well visualized  Ventricles:             Appears normal         Diaphragm:              Appears normal  Choroid Plexus:         Previously seen        Stomach:                Appears normal, left  sided  Cerebellum:             Previously seen        Abdomen:                Appears normal  Posterior Fossa:        Previously seen        Abdominal Wall:         Previously seen  Nuchal Fold:            Not applicable (>20    Cord Vessels:           Appears normal ([redacted]                          wks GA)                                        vessel cord)  Face:                   Orbits and profile     Kidneys:                Appear normal                          previously seen  Lips:                   Previously seen        Bladder:                Appears normal  Thoracic:               Previously seen        Spine:                  Previously seen  Heart:                  Previously seen        Upper Extremities:      Previously seen  RVOT:                   Previously seen        Lower Extremities:      Previously seen  LVOT:                   Previously seen  Other:   Heels/feet prev visualized. Nasal bone prev visualized. ---------------------------------------------------------------------- Doppler - Fetal Vessels  Umbilical Artery    S/D     %tile   3.64        93 ---------------------------------------------------------------------- Cervix Uterus Adnexa  Cervix  Not visualized (advanced GA >24wks) ---------------------------------------------------------------------- Impression  Antenatal testing performed due to SGA with abnormal UA  Dopplers  Biophyiscal profile 8/8 with reactive NST  Normal UA Dopplers  today  Good fetal movement and amniotic fluid ---------------------------------------------------------------------- Recommendations  Growth and BPP scheduled in 1 week ----------------------------------------------------------------------               Lin Landsman, MD Electronically Signed Final Report   07/08/2019 05:12 pm ----------------------------------------------------------------------  [place] MFM UA CORD DOPPLER  Result Date: 06/23/2019 ----------------------------------------------------------------------  OBSTETRICS REPORT                    (Corrected Final 06/23/2019 05:22 pm) ---------------------------------------------------------------------- Christie Parker  Info  ID #:       [id number]                          D.O.B.:  Jul 23, 1995 (24 yrs)  Name:       DENESIA DONELAN                      Visit Date: 06/23/2019 01:46 pm ---------------------------------------------------------------------- Performed By  Attending:        Noralee Space MD        Ref. Address:     [address]                                                             [place], [place]                                                             [address]  Performed By:     Eden Lathe BS      Location[facility]         [facility]                    RDMS RVT                                 Fetal Care  Referred By:      [facility]-                    Faculty Physician ---------------------------------------------------------------------- Orders  #  Description                           Code        Ordered By  1  [place] MFM OB DETAIL +14 WK               [id number]    Jaynie Collins  2  [place] MFM FETAL BPP                      19147.8     UGONNA ANYANWU     W/NONSTRESS  3  [place] MFM UA CORD DOPPLER                29562.13    Jaynie Collins ----------------------------------------------------------------------  #  Order #                     Accession #  Episode #  1  [id number]                   [id number]                 [id number]  2  [id number]                   [id number]                 [id number]  3  [id number]                   [id number]                 [id number] ---------------------------------------------------------------------- Indications  Encounter for antenatal screening for          Z36.3  malformations  Maternal care for known or suspected poor      O36.5930  fetal growth, third trimester, not applicable or  unspecified IUGR  Abnormal fetal ultrasound (elevated UA         O28.9  Doppler)  Late to prenatal care, third trimester         O09.33  [redacted] weeks gestation of pregnancy                Z3A.31 ---------------------------------------------------------------------- Fetal Evaluation  Num Of Fetuses:         1  Fetal Heart Rate(bpm):  138  Cardiac Activity:       Observed  Presentation:           Cephalic  Placenta:               Anterior  P. Cord Insertion:      Visualized  Amniotic Fluid  AFI FV:      Within normal limits  AFI Sum(cm)     %Tile       Largest Pocket(cm)  19.             72          6.5  RUQ(cm)       RLQ(cm)       LUQ(cm)        LLQ(cm)  3.4           6.5           4.2            4.9 ---------------------------------------------------------------------- Biophysical Evaluation  Amniotic F.V:   Within normal limits       F. Tone:        Observed  F. Movement:    Observed                   N.S.T:          Reactive  F. Breathing:   Observed                   Score:          10/10 ---------------------------------------------------------------------- Biometry  BPD:      78.1  mm     G. Age:  31w 2d         41  %    CI:        75.19   %    70 - 86  FL/HC:      20.5   %    19.3 - 21.3  HC:      285.7  mm     G. Age:  31w 3d         17  %    HC/AC:      1.11        0.96 - 1.17  AC:      256.4  mm     G. Age:  29w 6d         11  %    FL/BPD:     74.9   %    71 - 87  FL:       58.5   mm     G. Age:  30w 4d         18  %    FL/AC:      22.8   %    20 - 24  HUM:      50.1  mm     G. Age:  29w 3d         11  %  CER:      41.5  mm     G. Age:  35w 4d       > 95  %  Est. FW:    1551  gm      3 lb 7 oz     13  % ---------------------------------------------------------------------- OB History  Gravidity:    1         Term:   0        Prem:   0        SAB:   0  TOP:          0       Ectopic:  0        Living: 0 ---------------------------------------------------------------------- Gestational Age  LMP:           33w 1d        Date:  11/03/18                 EDD:   08/10/19  U/S Today:     30w 6d                                        EDD:   08/26/19  Best:          31w 2d     Det. By:  Previous Ultrasound      EDD:   08/23/19                                      (06/07/19) ---------------------------------------------------------------------- Anatomy  Cranium:               Appears normal         Aortic Arch:            Appears normal  Cavum:                 Appears normal         Ductal Arch:            Not well visualized  Ventricles:            Appears normal  Diaphragm:              Appears normal  Choroid Plexus:        Appears normal         Stomach:                Appears normal, left                                                                        sided  Cerebellum:            Appears normal         Abdomen:                Appears normal  Posterior Fossa:       Appears normal         Abdominal Wall:         Appears nml (cord                                                                        insert, abd wall)  Nuchal Fold:           Not applicable (>20    Cord Vessels:           Appears normal ([redacted]                         wks GA)                                        vessel cord)  Face:                  Appears normal         Kidneys:                Appear normal                         (orbits and profile)  Lips:                  Appears normal         Bladder:                 Appears normal  Thoracic:              Appears normal         Spine:                  Appears normal  Heart:                 Appears normal         Upper Extremities:      Appears normal                         (  4CH, axis, and                         situs)  RVOT:                  Appears normal         Lower Extremities:      Appears normal  LVOT:                  Appears normal  Other:  Heels/feet visualized. Nasal bone visualized. ---------------------------------------------------------------------- Doppler - Fetal Vessels  Umbilical Artery   S/D     %tile      RI                                      ADFV    RDFV   4.75   > 97.5    0.79                                         No      No ---------------------------------------------------------------------- Cervix Uterus Adnexa  Cervix  Not visualized (advanced GA >24wks)  Uterus  No abnormality visualized.  Right Ovary  Within normal limits.  Left Ovary  Within normal limits.  Cul De Sac  No free fluid seen.  Adnexa  No abnormality visualized. ---------------------------------------------------------------------- Impression  Christie Parker, G1 P0 with uncertain GA, is here for ultrasound  evaluation. On your office ultrasound performed on 06/07/19,  fetal growth restriction was suspected.  Christie Parker had one first-trimester ultrasound in [place] and was  told that her EDD is 08/25/19. Christie Parker had noted in her phone  but no report is available for our review.  EDD by ultrasound  performed on 06/07/19 ([place]) is 08/23/19. EDD by her  LMP date (?unsure) is 08/10/19.  Based on her history, I feel it is reasonable to establish her  EDD based on ultrasound performed on 06/07/19 at  [facility] i.e. 08/23/19. She is 31w 2d pregnant  today.  On today's ultrasound, amniotic fluid is normal and good fetal  activity is seen. The estimated fetal weight is at the 13th  percentile and AC measures at the 11th percentile. Amniotic  fluid is normal and good fetal activity  is seen. Fetal anatomy  appears normal but limited by advanced gestational age.  Because of suspicion of fetal growth restriction (considering  her uncertain dates and late ultrasound estimation of EDD),  we performed umbilical artery Doppler that showed increased  S/D ratio. BPP 10/10.  NST is reactive.  I explained the findings and that her EDD is amended to  08/23/19. I explained the possibility of fetal growth restriction  (placental insufficiency is the most likely cause).  Christie Parker informed that it will not be possible for her to come for  weekly ultrasound assessments. ---------------------------------------------------------------------- Recommendations  -An appointment was made for her to return in 2 weeks for  umbilical artery Doppler and NST.  -Fetal growth in 3 weeks. ----------------------------------------------------------------------                       Noralee Space, MD Electronically Signed Corrected Final Report  06/23/2019 05:22 pm ----------------------------------------------------------------------  US ABDOMEN LIMITED RUQ  Result Date: 07/15/2019 CLINICAL DATA:  24 year old female with RIGHT UPPER quadrant  pain and vomiting. EXAM: ULTRASOUND ABDOMEN LIMITED RIGHT UPPER QUADRANT COMPARISON:  None. FINDINGS: Gallbladder: Equivocal mild focal gallbladder wall thickening adjacent to the liver is noted. There is no evidence of sonographic Murphy sign or cholelithiasis. Common bile duct: Diameter: 5 mm. No intrahepatic or extrahepatic biliary dilatation noted. Liver: No focal lesion identified. Within normal limits in parenchymal echogenicity. Portal vein is patent on color Doppler imaging with normal direction of blood flow towards the liver. Other: None. IMPRESSION: 1. Equivocal focal gallbladder wall thickening adjacent to the liver. Given no sonographic Murphy sign or evidence of cholelithiasis, this is not felt to represent acute cholecystitis. Consider ultrasound follow-up as clinically  indicated. 2. No other significant abnormalities. Electronically Signed   By: Harmon Pier M.D.   On: 07/15/2019 21:53    MAU Course: Christie Parker was checked in and through HPI and ROS was obtained using Interpretor services. FHT monitors were placed on baby. Maternal blood pressures were continuously monitored and Pre-e labs were drawn due to elevated BP readings. PCR was slightly elevated at .26, BUN and Creatinine were wnl. Toco monitoring shows regular contractions for which Christie Parker was given Procardia x3 and IV fluids. Cervical exam x2 was closed and thick.  Christie Parker also became tachycardic (135-150 bpm) most likely due to anxiety, for which the Christie Parker was given hydroxyzine.  Christie Parker was sent for repeat U/S and BPP resulting in an 8/8 score. After Hydroxyzine, Christie Parker's HR lowered to 130 and Christie Parker was reassuring.   Assessment: Pt. Is a [redacted]w[redacted]d G1P0 that presented to MAU 2/2 to non-reassuring BPP at MFM (6/10), elevated blood pressures, tachycardia, and regular contractions. After receiving Procardia x3, Hydroxyzine, and IV fluids, Christie Parker is hemodynamically stable. On repeat BPP, baby scored an 8/8. Both mom and baby are reassuring.  Plan: Discharge home in stable condition. Transition follow-up prenatal care to [facility]. Labor precautions and fetal kick counts  Follow-up Information    Department, [facility] Follow up.   Contact information: [address] [place] [address] [phone]               Joyce Gross, MS   I agree with the above documentation and findings. See MAU provider note for Billing purposes.   Luna Kitchens CNM

## 2019-07-19 ENCOUNTER — Other Ambulatory Visit: Payer: Self-pay

## 2019-07-19 ENCOUNTER — Encounter (HOSPITAL_COMMUNITY): Payer: Self-pay | Admitting: Obstetrics & Gynecology

## 2019-07-19 ENCOUNTER — Inpatient Hospital Stay (HOSPITAL_COMMUNITY)
Admission: AD | Admit: 2019-07-19 | Discharge: 2019-07-19 | Disposition: A | Discharge status: Home / Self Care | Payer: Medicaid Other | Attending: Obstetrics & Gynecology | Admitting: Obstetrics & Gynecology

## 2019-07-19 DIAGNOSIS — O99891 Other specified diseases and conditions complicating pregnancy: Secondary | ICD-10-CM | POA: Diagnosis not present

## 2019-07-19 DIAGNOSIS — Z3A35 35 weeks gestation of pregnancy: Secondary | ICD-10-CM | POA: Diagnosis not present

## 2019-07-19 DIAGNOSIS — O365930 Maternal care for other known or suspected poor fetal growth, third trimester, not applicable or unspecified: Secondary | ICD-10-CM | POA: Diagnosis not present

## 2019-07-19 DIAGNOSIS — E161 Other hypoglycemia: Secondary | ICD-10-CM | POA: Diagnosis not present

## 2019-07-19 DIAGNOSIS — Z3403 Encounter for supervision of normal first pregnancy, third trimester: Secondary | ICD-10-CM | POA: Diagnosis not present

## 2019-07-19 DIAGNOSIS — O0933 Supervision of pregnancy with insufficient antenatal care, third trimester: Secondary | ICD-10-CM | POA: Insufficient documentation

## 2019-07-19 DIAGNOSIS — Z79899 Other long term (current) drug therapy: Secondary | ICD-10-CM | POA: Insufficient documentation

## 2019-07-19 DIAGNOSIS — O133 Gestational [pregnancy-induced] hypertension without significant proteinuria, third trimester: Secondary | ICD-10-CM | POA: Insufficient documentation

## 2019-07-19 DIAGNOSIS — O0930 Supervision of pregnancy with insufficient antenatal care, unspecified trimester: Secondary | ICD-10-CM | POA: Diagnosis not present

## 2019-07-19 DIAGNOSIS — R030 Elevated blood-pressure reading, without diagnosis of hypertension: Secondary | ICD-10-CM | POA: Diagnosis not present

## 2019-07-19 DIAGNOSIS — Z789 Other specified health status: Secondary | ICD-10-CM | POA: Diagnosis not present

## 2019-07-19 DIAGNOSIS — N6313 Unspecified lump in the right breast, lower outer quadrant: Secondary | ICD-10-CM | POA: Diagnosis not present

## 2019-07-19 DIAGNOSIS — R748 Abnormal levels of other serum enzymes: Secondary | ICD-10-CM | POA: Diagnosis not present

## 2019-07-19 DIAGNOSIS — E162 Hypoglycemia, unspecified: Secondary | ICD-10-CM | POA: Diagnosis not present

## 2019-07-19 DIAGNOSIS — R1011 Right upper quadrant pain: Secondary | ICD-10-CM | POA: Diagnosis not present

## 2019-07-19 DIAGNOSIS — I10 Essential (primary) hypertension: Secondary | ICD-10-CM | POA: Diagnosis not present

## 2019-07-19 DIAGNOSIS — G4489 Other headache syndrome: Secondary | ICD-10-CM | POA: Diagnosis not present

## 2019-07-19 DIAGNOSIS — Z3689 Encounter for other specified antenatal screening: Secondary | ICD-10-CM

## 2019-07-19 LAB — COMPREHENSIVE METABOLIC PANEL
ALT: 57 U/L — ABNORMAL HIGH (ref 0–44)
AST: 23 U/L (ref 15–41)
Albumin: 2.5 g/dL — ABNORMAL LOW (ref 3.5–5.0)
Alkaline Phosphatase: 137 U/L — ABNORMAL HIGH (ref 38–126)
Anion gap: 10 (ref 5–15)
BUN: 5 mg/dL — ABNORMAL LOW (ref 6–20)
CO2: 18 mmol/L — ABNORMAL LOW (ref 22–32)
Calcium: 8.6 mg/dL — ABNORMAL LOW (ref 8.9–10.3)
Chloride: 109 mmol/L (ref 98–111)
Creatinine, Ser: 0.48 mg/dL (ref 0.44–1.00)
GFR calc Af Amer: >60 mL/min (ref >60)
GFR calc non Af Amer: >60 mL/min (ref >60)
Glucose, Bld: 70 mg/dL (ref 70–99)
Potassium: 3.7 mmol/L (ref 3.5–5.1)
Sodium: 137 mmol/L (ref 135–145)
Total Bilirubin: 0.4 mg/dL (ref 0.3–1.2)
Total Protein: 6.3 g/dL — ABNORMAL LOW (ref 6.5–8.1)

## 2019-07-19 LAB — CBC
HCT: 35.4 % — ABNORMAL LOW (ref 36.0–46.0)
Hemoglobin: 11.8 g/dL — ABNORMAL LOW (ref 12.0–15.0)
MCH: 30.7 pg (ref 26.0–34.0)
MCHC: 33.3 g/dL (ref 30.0–36.0)
MCV: 92.2 fL (ref 80.0–100.0)
Platelets: 173 10*3/uL (ref 150–400)
RBC: 3.84 MIL/uL — ABNORMAL LOW (ref 3.87–5.11)
RDW: 13 % (ref 11.5–15.5)
WBC: 7.4 10*3/uL (ref 4.0–10.5)
nRBC: 0 % (ref 0.0–0.2)

## 2019-07-19 LAB — URINALYSIS, ROUTINE W REFLEX MICROSCOPIC
Bilirubin Urine: NEGATIVE
Glucose, UA: NEGATIVE mg/dL
Hgb urine dipstick: NEGATIVE
Ketones, ur: NEGATIVE mg/dL
Leukocytes,Ua: NEGATIVE
Nitrite: NEGATIVE
Protein, ur: NEGATIVE mg/dL
Specific Gravity, Urine: 1.003 — ABNORMAL LOW (ref 1.005–1.030)
pH: 6 (ref 5.0–8.0)

## 2019-07-19 LAB — PROTEIN / CREATININE RATIO, URINE
Creatinine, Urine: 20.36 mg/dL
Total Protein, Urine: <6 mg/dL

## 2019-07-19 MED ORDER — ACETAMINOPHEN 500 MG PO TABS
1000.0000 mg | ORAL_TABLET | Freq: Four times a day (QID) | ORAL | Status: DC | PRN
  Administered 2019-07-19: 1000 mg via ORAL
  Filled 2019-07-19: qty 2

## 2019-07-19 NOTE — Discharge Instructions (Signed)
Hypertension During Pregnancy Hypertension is also called high blood pressure. High blood pressure means that the force of your blood moving in your body is too strong. It can cause problems for you and your baby. Different types of high blood pressure can happen during pregnancy. The types are:  High blood pressure before you got pregnant. This is called chronic hypertension.  This can continue during your pregnancy. Your doctor will want to keep checking your blood pressure. You may need medicine to keep your blood pressure under control while you are pregnant. You will need follow-up visits after you have your baby.  High blood pressure that goes up during pregnancy when it was normal before. This is called gestational hypertension. It will usually get better after you have your baby, but your doctor will need to watch your blood pressure to make sure that it is getting better.  Very high blood pressure during pregnancy. This is called preeclampsia. Very high blood pressure is an emergency that needs to be checked and treated right away.  You may develop very high blood pressure after giving birth. This is called postpartum preeclampsia. This usually occurs within 48 hours after childbirth but may occur up to 6 weeks after giving birth. This is rare. How does this affect me? If you have high blood pressure during pregnancy, you have a higher chance of developing high blood pressure:  As you get older.  If you get pregnant again. In some cases, high blood pressure during pregnancy can cause:  Stroke.  Heart attack.  Damage to the kidneys, lungs, or liver.  Preeclampsia.  Jerky movements you cannot control (convulsions or seizures).  Problems with the placenta. How does this affect my baby? Your baby may:  Be born early.  Not weigh as much as he or she should.  Not handle labor well, leading to a c-section birth. What are the risks?  Having high blood pressure during a past  pregnancy.  Being overweight.  Being 35 years old or older.  Being pregnant for the first time.  Being pregnant with more than one baby.  Becoming pregnant using fertility methods, such as IVF.  Having other problems, such as diabetes, or kidney disease.  Having family members who have high blood pressure. What can I do to lower my risk?   Keep a healthy weight.  Eat a healthy diet.  Follow what your doctor tells you about treating any medical problems that you had before becoming pregnant. It is very important to go to all of your doctor visits. Your doctor will check your blood pressure and make sure that your pregnancy is progressing as it should. Treatment should start early if a problem is found. How is this treated? Treatment for high blood pressure during pregnancy can differ depending on the type of high blood pressure you have and how serious it is.  You may need to take blood pressure medicine.  If you have been taking medicine for your blood pressure, you may need to change the medicine during pregnancy if it is not safe for your baby.  If your doctor thinks that you could get very high blood pressure, he or she may tell you to take a low-dose aspirin during your pregnancy.  If you have very high blood pressure, you may need to stay in the hospital so you and your baby can be watched closely. You may also need to take medicine to lower your blood pressure. This medicine may be given by mouth   or through an IV tube.  In some cases, if your condition gets worse, you may need to have your baby early. Follow these instructions at home: Eating and drinking   Drink enough fluid to keep your pee (urine) pale yellow.  Avoid caffeine. Lifestyle  Do not use any products that contain nicotine or tobacco, such as cigarettes, e-cigarettes, and chewing tobacco. If you need help quitting, ask your doctor.  Do not use alcohol or drugs.  Avoid stress.  Rest and get plenty  of sleep.  Regular exercise can help. Ask your doctor what kinds of exercise are best for you. General instructions  Take over-the-counter and prescription medicines only as told by your doctor.  Keep all prenatal and follow-up visits as told by your doctor. This is important. Contact a doctor if:  You have symptoms that your doctor told you to watch for, such as: ? Headaches. ? Nausea. ? Vomiting. ? Belly (abdominal) pain. ? Dizziness. ? Light-headedness. Get help right away if:  You have: ? Very bad belly pain that does not get better with treatment. ? A very bad headache that does not get better. ? Vomiting that does not get better. ? Sudden, fast weight gain. ? Sudden swelling in your hands, ankles, or face. ? Bleeding from your vagina. ? Blood in your pee. ? Blurry vision. ? Double vision. ? Shortness of breath. ? Chest pain. ? Weakness on one side of your body. ? Trouble talking.  Your baby is not moving as much as usual. Summary  High blood pressure is also called hypertension.  High blood pressure means that the force of your blood moving in your body is too strong.  High blood pressure can cause problems for you and your baby.  Keep all follow-up visits as told by your doctor. This is important. This information is not intended to replace advice given to you by your health care provider. Make sure you discuss any questions you have with your health care provider. Document Revised: 05/14/2018 Document Reviewed: 02/17/2018 Elsevier Patient Education  2020 [employer]   Fetal Movement Counts Patient Name: ________________________________________________ Patient Due Date: ____________________ What is a fetal movement count?  A fetal movement count is the number of times that you feel your baby move during a certain amount of time. This may also be called a fetal kick count. A fetal movement count is recommended for every pregnant woman. You may be asked to  start counting fetal movements as early as week 28 of your pregnancy. Pay attention to when your baby is most active. You may notice your baby's sleep and wake cycles. You may also notice things that make your baby move more. You should do a fetal movement count:  When your baby is normally most active.  At the same time each day. A good time to count movements is while you are resting, after having something to eat and drink. How do I count fetal movements? 1. Find a quiet, comfortable area. Sit, or lie down on your side. 2. Write down the date, the start time and stop time, and the number of movements that you felt between those two times. Take this information with you to your health care visits. 3. Write down your start time when you feel the first movement. 4. Count kicks, flutters, swishes, rolls, and jabs. You should feel at least 10 movements. 5. You may stop counting after you have felt 10 movements, or if you have been counting for 2  hours. Write down the stop time. 6. If you do not feel 10 movements in 2 hours, contact your health care provider for further instructions. Your health care provider may want to do additional tests to assess your baby's well-being. Contact a health care provider if:  You feel fewer than 10 movements in 2 hours.  Your baby is not moving like he or she usually does. Date: ____________ Start time: ____________ Stop time: ____________ Movements: ____________ Date: ____________ Start time: ____________ Stop time: ____________ Movements: ____________ Date: ____________ Start time: ____________ Stop time: ____________ Movements: ____________ Date: ____________ Start time: ____________ Stop time: ____________ Movements: ____________ Date: ____________ Start time: ____________ Stop time: ____________ Movements: ____________ Date: ____________ Start time: ____________ Stop time: ____________ Movements: ____________ Date: ____________ Start time: ____________  Stop time: ____________ Movements: ____________ Date: ____________ Start time: ____________ Stop time: ____________ Movements: ____________ Date: ____________ Start time: ____________ Stop time: ____________ Movements: ____________ This information is not intended to replace advice given to you by your health care provider. Make sure you discuss any questions you have with your health care provider. Document Revised: 09/10/2018 Document Reviewed: 09/10/2018 Elsevier Patient Education  2020 [employer].

## 2019-07-19 NOTE — MAU Provider Note (Addendum)
History     CSN: [id number]  Arrival date and time: 07/19/19 1206   First Provider Initiated Contact with Patient 07/19/19 1241      Chief Complaint  Patient presents with   Hypertension   24 y.o. G1 @35 .0 wks sent from office for elevated BP. Reports mild frontal HA, rates 1/10. Has not tried anything for it. Denies visual disturbances, RUQ pain, CP, and SOB. Reports good FM. No VB, LOF, and ctx. Her pregnancy is complicated by IUGR, late prenatal care, and gHTN.  OB History    Gravida  1   Para      Term      Preterm      AB      Living        SAB      TAB      Ectopic      Multiple      Live Births              Past Medical History:  Diagnosis Date   Medical history non-contributory     Past Surgical History:  Procedure Laterality Date   NO PAST SURGERIES      History reviewed. No pertinent family history.  Social History   Tobacco Use   Smoking status: Never Smoker   Smokeless tobacco: Never Used  Vaping Use   Vaping Use: Never used  Substance Use Topics   Alcohol use: Never   Drug use: Never    Allergies: No Known Allergies  Medications Prior to Admission  Medication Sig Dispense Refill Last Dose   ondansetron (ZOFRAN ODT) 4 MG disintegrating tablet Take 1 tablet (4 mg total) by mouth every 6 (six) hours as needed for nausea. 20 tablet 0 Past Week at Unknown time   Prenatal Vit-Fe Fumarate-FA (PRENATAL VITAMIN PO) Take by mouth.   07/19/2019 at Unknown time   simethicone (MYLICON) 80 MG chewable tablet Chew 1 tablet (80 mg total) by mouth 4 (four) times daily as needed for flatulence. 30 tablet 0 Past Week at Unknown time    Review of Systems  Eyes: Negative for visual disturbance.  Respiratory: Negative for shortness of breath.   Cardiovascular: Negative for chest pain.  Gastrointestinal: Negative for abdominal pain.  Genitourinary: Negative for vaginal bleeding and vaginal discharge.  Neurological: Positive for  headaches.   Physical Exam   Blood pressure 130/83, pulse 87, temperature 98.9 F (37.2 C), temperature source Oral, resp. rate 15, last menstrual period 11/03/2018, SpO2 100 %. Patient Vitals for the past 24 hrs:  BP Temp Temp src Pulse Resp SpO2  07/19/19 1530 125/78 -- -- 87 16 96 %  07/19/19 1515 (!) 116/95 -- -- 79 -- 97 %  07/19/19 1500 127/89 -- -- 76 -- 98 %  07/19/19 1446 128/75 -- -- 79 -- --  07/19/19 1431 126/81 -- -- 84 -- --  07/19/19 1415 135/89 -- -- 85 -- 99 %  07/19/19 1401 136/77 -- -- 85 -- --  07/19/19 1345 125/80 -- -- 91 -- 98 %  07/19/19 1330 123/72 -- -- 78 -- 98 %  07/19/19 1315 128/81 -- -- 81 -- 98 %  07/19/19 1245 137/88 -- -- 84 -- 98 %  07/19/19 1231 130/83 -- -- 87 -- --  07/19/19 1222 137/86 98.9 F (37.2 C) Oral 86 15 100 %    Physical Exam  Nursing note and vitals reviewed. Constitutional: She is oriented to person, place, and time.  HENT:  Head: Normocephalic and  atraumatic.  Cardiovascular: Normal rate.  Respiratory: Effort normal. No respiratory distress.  GI: Soft. Normal appearance. She exhibits no distension. There is no abdominal tenderness.  gravid  Musculoskeletal:        General: Normal range of motion.     Cervical back: Normal range of motion.     Right lower leg: Edema present.     Left lower leg: Edema (1+) present.  Neurological: She is alert and oriented to person, place, and time.  Skin: Skin is warm and dry.  Psychiatric: Mood normal.  EFM: 135 bpm, mod variability, + accels, no decels Toco: irregular  Results for orders placed or performed during the hospital encounter of 07/19/19 (from the past 24 hour(s))  Urinalysis, Routine w reflex microscopic     Status: Abnormal   Collection Time: 07/19/19 12:16 PM  Result Value Ref Range   Color, Urine STRAW (A) YELLOW   APPearance CLEAR CLEAR   Specific Gravity, Urine 1.003 (L) 1.005 - 1.030   pH 6.0 5.0 - 8.0   Glucose, UA NEGATIVE NEGATIVE mg/dL   Hgb urine dipstick  NEGATIVE NEGATIVE   Bilirubin Urine NEGATIVE NEGATIVE   Ketones, ur NEGATIVE NEGATIVE mg/dL   Protein, ur NEGATIVE NEGATIVE mg/dL   Nitrite NEGATIVE NEGATIVE   Leukocytes,Ua NEGATIVE NEGATIVE  Protein / creatinine ratio, urine     Status: None   Collection Time: 07/19/19 12:16 PM  Result Value Ref Range   Creatinine, Urine 20.36 mg/dL   Total Protein, Urine <6 mg/dL   Protein Creatinine Ratio        0.00 - 0.15 mg/mg[Cre]  CBC     Status: Abnormal   Collection Time: 07/19/19 12:57 PM  Result Value Ref Range   WBC 7.4 4.0 - 10.5 K/uL   RBC 3.84 (L) 3.87 - 5.11 MIL/uL   Hemoglobin 11.8 (L) 12.0 - 15.0 g/dL   HCT 35.4 (L) 36 - 46 %   MCV 92.2 80.0 - 100.0 fL   MCH 30.7 26.0 - 34.0 pg   MCHC 33.3 30.0 - 36.0 g/dL   RDW 13.0 11.5 - 15.5 %   Platelets 173 150 - 400 K/uL   nRBC 0.0 0.0 - 0.2 %  Comprehensive metabolic panel     Status: Abnormal   Collection Time: 07/19/19 12:57 PM  Result Value Ref Range   Sodium 137 135 - 145 mmol/L   Potassium 3.7 3.5 - 5.1 mmol/L   Chloride 109 98 - 111 mmol/L   CO2 18 (L) 22 - 32 mmol/L   Glucose, Bld 70 70 - 99 mg/dL   BUN 5 (L) 6 - 20 mg/dL   Creatinine, Ser 0.48 0.44 - 1.00 mg/dL   Calcium 8.6 (L) 8.9 - 10.3 mg/dL   Total Protein 6.3 (L) 6.5 - 8.1 g/dL   Albumin 2.5 (L) 3.5 - 5.0 g/dL   AST 23 15 - 41 U/L   ALT 57 (H) 0 - 44 U/L   Alkaline Phosphatase 137 (H) 38 - 126 U/L   Total Bilirubin 0.4 0.3 - 1.2 mg/dL   GFR calc non Af Amer >60 >60 mL/min   GFR calc Af Amer >60 >60 mL/min   Anion gap 10 5 - 15   MAU Course  Procedures Tylenol 1g  MDM Labs ordered and reviewed. ALT increased from previous result. No evidence of PEC. HA resolved. Consult with Dr. Roselie Awkward. Plan for IOL at 37 wks for gHTN and IUGR. Discussed results and plan with pt, verbalizes understanding. [place] interpreter.  Stable for discharge home.  Assessment and Plan   1. [redacted] weeks gestation of pregnancy   2. NST (non-stress test) reactive   3. Gestational  hypertension, third trimester   4. Elevated liver enzymes    Discharge home Follow up this week at [facility]- message sent to schedule pt Strict PEC precautions FMCs  Allergies as of 07/19/2019   No Known Allergies     Medication List    TAKE these medications   ondansetron 4 MG disintegrating tablet Commonly known as: Zofran ODT Take 1 tablet (4 mg total) by mouth every 6 (six) hours as needed for nausea.   PRENATAL VITAMIN PO Take by mouth.   simethicone 80 MG chewable tablet Commonly known as: MYLICON Chew 1 tablet (80 mg total) by mouth 4 (four) times daily as needed for flatulence.      Donette Larry, CNM 07/19/2019, 12:45 PM

## 2019-07-19 NOTE — MAU Note (Signed)
.   Christie Parker is a 24 y.o. at [redacted]w[redacted]d here in MAU reporting: Arrived via EMS sent from health department for high blood pressure. Patient reports HA and blurred vision this morning but has had swelling for x2 weeks. Patient denies epigastric pain. No VB or lOF. Endorses good fetal movement.   Pain score: 2 Vitals:   07/19/19 1222  BP: 137/86  Pulse: 86  Resp: 15  Temp: 98.9 F (37.2 C)  SpO2: 100%     FHT:135 Lab orders placed from triage: UA

## 2019-07-21 ENCOUNTER — Inpatient Hospital Stay (HOSPITAL_COMMUNITY)
Admission: AD | Admit: 2019-07-21 | Discharge: 2019-07-21 | Disposition: A | Discharge status: Home / Self Care | Payer: Medicaid Other | Attending: Obstetrics & Gynecology | Admitting: Obstetrics & Gynecology

## 2019-07-21 ENCOUNTER — Ambulatory Visit (INDEPENDENT_AMBULATORY_CARE_PROVIDER_SITE_OTHER): Payer: Medicaid Other | Admitting: Obstetrics and Gynecology

## 2019-07-21 ENCOUNTER — Encounter (HOSPITAL_COMMUNITY): Payer: Self-pay | Admitting: Obstetrics & Gynecology

## 2019-07-21 ENCOUNTER — Encounter: Payer: Self-pay | Admitting: Obstetrics and Gynecology

## 2019-07-21 ENCOUNTER — Other Ambulatory Visit: Payer: Self-pay

## 2019-07-21 VITALS — BP 168/88 | HR 111 | Wt 163.0 lb

## 2019-07-21 DIAGNOSIS — O133 Gestational [pregnancy-induced] hypertension without significant proteinuria, third trimester: Secondary | ICD-10-CM

## 2019-07-21 DIAGNOSIS — O0993 Supervision of high risk pregnancy, unspecified, third trimester: Secondary | ICD-10-CM

## 2019-07-21 DIAGNOSIS — O0990 Supervision of high risk pregnancy, unspecified, unspecified trimester: Secondary | ICD-10-CM | POA: Insufficient documentation

## 2019-07-21 DIAGNOSIS — O1493 Unspecified pre-eclampsia, third trimester: Secondary | ICD-10-CM

## 2019-07-21 DIAGNOSIS — Z3A35 35 weeks gestation of pregnancy: Secondary | ICD-10-CM | POA: Insufficient documentation

## 2019-07-21 DIAGNOSIS — Z8759 Personal history of other complications of pregnancy, childbirth and the puerperium: Secondary | ICD-10-CM | POA: Diagnosis present

## 2019-07-21 DIAGNOSIS — R030 Elevated blood-pressure reading, without diagnosis of hypertension: Secondary | ICD-10-CM | POA: Diagnosis present

## 2019-07-21 DIAGNOSIS — Z3689 Encounter for other specified antenatal screening: Secondary | ICD-10-CM

## 2019-07-21 DIAGNOSIS — O1403 Mild to moderate pre-eclampsia, third trimester: Secondary | ICD-10-CM

## 2019-07-21 DIAGNOSIS — O365930 Maternal care for other known or suspected poor fetal growth, third trimester, not applicable or unspecified: Secondary | ICD-10-CM

## 2019-07-21 LAB — COMPREHENSIVE METABOLIC PANEL
ALT: 37 U/L (ref 0–44)
AST: 21 U/L (ref 15–41)
Albumin: 2.5 g/dL — ABNORMAL LOW (ref 3.5–5.0)
Alkaline Phosphatase: 146 U/L — ABNORMAL HIGH (ref 38–126)
Anion gap: 9 (ref 5–15)
BUN: 6 mg/dL (ref 6–20)
CO2: 18 mmol/L — ABNORMAL LOW (ref 22–32)
Calcium: 8.6 mg/dL — ABNORMAL LOW (ref 8.9–10.3)
Chloride: 109 mmol/L (ref 98–111)
Creatinine, Ser: 0.55 mg/dL (ref 0.44–1.00)
GFR calc Af Amer: >60 mL/min (ref >60)
GFR calc non Af Amer: >60 mL/min (ref >60)
Glucose, Bld: 80 mg/dL (ref 70–99)
Potassium: 3.6 mmol/L (ref 3.5–5.1)
Sodium: 136 mmol/L (ref 135–145)
Total Bilirubin: 0.5 mg/dL (ref 0.3–1.2)
Total Protein: 6.3 g/dL — ABNORMAL LOW (ref 6.5–8.1)

## 2019-07-21 LAB — URINALYSIS, ROUTINE W REFLEX MICROSCOPIC
Bilirubin Urine: NEGATIVE
Glucose, UA: NEGATIVE mg/dL
Hgb urine dipstick: NEGATIVE
Ketones, ur: NEGATIVE mg/dL
Leukocytes,Ua: NEGATIVE
Nitrite: NEGATIVE
Protein, ur: NEGATIVE mg/dL
Specific Gravity, Urine: 1.003 — ABNORMAL LOW (ref 1.005–1.030)
pH: 6 (ref 5.0–8.0)

## 2019-07-21 LAB — CBC
HCT: 34 % — ABNORMAL LOW (ref 36.0–46.0)
Hemoglobin: 11.5 g/dL — ABNORMAL LOW (ref 12.0–15.0)
MCH: 30.8 pg (ref 26.0–34.0)
MCHC: 33.8 g/dL (ref 30.0–36.0)
MCV: 91.2 fL (ref 80.0–100.0)
Platelets: 195 10*3/uL (ref 150–400)
RBC: 3.73 MIL/uL — ABNORMAL LOW (ref 3.87–5.11)
RDW: 13 % (ref 11.5–15.5)
WBC: 8.3 10*3/uL (ref 4.0–10.5)
nRBC: 0 % (ref 0.0–0.2)

## 2019-07-21 LAB — PROTEIN / CREATININE RATIO, URINE
Creatinine, Urine: 22.18 mg/dL
Protein Creatinine Ratio: 0.32 mg/mg{Cre} — ABNORMAL HIGH (ref 0.00–0.15)
Total Protein, Urine: 7 mg/dL

## 2019-07-21 MED ORDER — BETAMETHASONE SOD PHOS & ACET 6 (3-3) MG/ML IJ SUSP
12.0000 mg | Freq: Once | INTRAMUSCULAR | Status: AC
  Administered 2019-07-21: 12 mg via INTRAMUSCULAR
  Filled 2019-07-21: qty 5

## 2019-07-21 NOTE — MAU Note (Signed)
Spoke with husband on phone and explained BMZ and told him she needs to return tomorrow for 2nd injection. Verbalized understanding

## 2019-07-21 NOTE — Progress Notes (Signed)
INITIAL PRENATAL VISIT NOTE  Subjective:  Christie Parker is a 24 y.o. G1P0 at [redacted]w[redacted]d by [place] being seen today for her initial prenatal visit, transfer from [facility] for gHTN noted at appt 2 days ago. She has an obstetric history significant for n/a. She has a medical history significant for n/a.  Patient reports no complaints.  Contractions: Not present. Vag. Bleeding: None.  Movement: Present. Denies leaking of fluid.   Past Medical History:  Diagnosis Date   Medical history non-contributory     Past Surgical History:  Procedure Laterality Date   NO PAST SURGERIES      OB History  Gravida Para Term Preterm AB Living  1            SAB TAB Ectopic Multiple Live Births               # Outcome Date GA Lbr Len/2nd Weight Sex Delivery Anes PTL Lv  1 Current             Social History   Socioeconomic History   Marital status: Married    Spouse name: Not on file   Number of children: Not on file   Years of education: Not on file   Highest education level: Not on file  Occupational History   Not on file  Tobacco Use   Smoking status: Never Smoker   Smokeless tobacco: Never Used  Vaping Use   Vaping Use: Never used  Substance and Sexual Activity   Alcohol use: Never   Drug use: Never   Sexual activity: Yes  Other Topics Concern   Not on file  Social History Narrative   Not on file   Social Determinants of Health   Financial Resource Strain:    Difficulty of Paying Living Expenses:   Food Insecurity:    Worried About Charity fundraiser in the Last Year:    Arboriculturist in the Last Year:   Transportation Needs:    Film/video editor (Medical):    Lack of Transportation (Non-Medical):   Physical Activity:    Days of Exercise per Week:    Minutes of Exercise per Session:   Stress:    Feeling of Stress :   Social Connections:    Frequency of Communication with Friends and Family:    Frequency of Social Gatherings with Friends and Family:     Attends Religious Services:    Active Member of Clubs or Organizations:    Attends Archivist Meetings:    Marital Status:    History reviewed. No pertinent family history.  No current outpatient medications on file.  No Known Allergies  Review of Systems: Negative except for what is mentioned in HPI.  Objective:   Vitals:   07/21/19 1516  BP: (!) 168/88  Pulse: (!) 111  Weight: 163 lb (73.9 kg)   Fetal Status: Fetal Heart Rate (bpm): 140   Movement: Present     Physical Exam: BP (!) 168/88    Pulse (!) 111    Wt 163 lb (73.9 kg)    LMP 11/03/2018 (Exact Date)    BMI 29.81 kg/m  CONSTITUTIONAL: Well-developed, well-nourished female in no acute distress.  NEUROLOGIC: Alert and oriented to person, place, and time. Normal reflexes, muscle tone coordination. No cranial nerve deficit noted. PSYCHIATRIC: Normal mood and affect. Normal behavior. Normal judgment and thought content. SKIN: Skin is warm and dry. No rash noted. Not diaphoretic. No erythema. No pallor. HENT:  Normocephalic, atraumatic, External right and left ear normal. Oropharynx is clear and moist EYES: Conjunctivae and EOM are normal. Pupils are equal, round, and reactive to light. No scleral icterus.  NECK: Normal range of motion, supple, no masses CARDIOVASCULAR: Normal heart rate noted, regular rhythm RESPIRATORY: Effort and breath sounds normal, no problems with respiration noted BREASTS: deferred ABDOMEN: Soft, nontender, nondistended, gravid. GU: deferred MUSCULOSKELETAL: Normal range of motion. EXT:  No edema and no tenderness. 2+ distal pulses.   Assessment and Plan:  Pregnancy: G1P0 at [redacted]w[redacted]d by [place]  1. Supervision of high risk pregnancy, antepartum  2. Gestational hypertension, third trimester Severe range BP today To MAU for eval  3. Poor fetal growth affecting management of mother in third trimester, single or unspecified fetus IUGR noted on [place] 2 days ago   To MAU for workup,  staff aware.  Preterm labor symptoms and general obstetric precautions including but not limited to vaginal bleeding, contractions, leaking of fluid and fetal movement were reviewed in detail with the patient.  Please refer to After Visit Summary for other counseling recommendations.   Return in about 1 week (around 07/28/2019) for high OB, in person.  Conan Bowens 07/21/2019 4:33 PM

## 2019-07-21 NOTE — MAU Note (Signed)
Sent from MD office for BP evaluation.  Denies H/A, visual disturbances, and epigastric pain.  Endorses increased swelling.  Denies LOF or VB.  Reports +FM.

## 2019-07-21 NOTE — Progress Notes (Signed)
Second Covid vaccine 2 days ago feels fine now.  Feels fine today with elevated blood pressure.  Needs transportation waiver signed. interpreter will help with intake.

## 2019-07-21 NOTE — MAU Provider Note (Signed)
History     CSN: [id number]  Arrival date and time: 07/21/19 1629   First Provider Initiated Contact with Patient 07/21/19 1658      Chief Complaint  Patient presents with   BP Evaluation   Christie Parker is a 24 y.o. G1P0 at [redacted]w[redacted]d who receives care at [facility].  She presents today for BP Evaluation after having an elevated BP in the office.  Patient presents and denies HA, visual disturbances, and epigastric pain. Patient endorses fetal movement and denies vaginal concerns including bleeding, leaking, and discharge.      OB History    Gravida  1   Para      Term      Preterm      AB      Living        SAB      TAB      Ectopic      Multiple      Live Births              Past Medical History:  Diagnosis Date   Medical history non-contributory     Past Surgical History:  Procedure Laterality Date   NO PAST SURGERIES      No family history on file.  Social History   Tobacco Use   Smoking status: Never Smoker   Smokeless tobacco: Never Used  Vaping Use   Vaping Use: Never used  Substance Use Topics   Alcohol use: Never   Drug use: Never    Allergies: No Known Allergies  Medications Prior to Admission  Medication Sig Dispense Refill Last Dose   ondansetron (ZOFRAN ODT) 4 MG disintegrating tablet Take 1 tablet (4 mg total) by mouth every 6 (six) hours as needed for nausea. 20 tablet 0    Prenatal Vit-Fe Fumarate-FA (PRENATAL VITAMIN PO) Take by mouth.      simethicone (MYLICON) 80 MG chewable tablet Chew 1 tablet (80 mg total) by mouth 4 (four) times daily as needed for flatulence. 30 tablet 0     Review of Systems  Eyes: Negative for visual disturbance.  Gastrointestinal: Negative for abdominal pain, nausea and vomiting.  Genitourinary: Negative for vaginal bleeding and vaginal discharge.  Musculoskeletal: Negative for back pain.  Neurological: Negative for dizziness, light-headedness and headaches.   Physical Exam   Blood  pressure (!) 136/93, pulse 99, resp. rate 20, height 5' 2.5" (1.588 m), weight 75 kg, last menstrual period 11/03/2018, SpO2 99 %.   Vitals:   07/21/19 1745 07/21/19 1800 07/21/19 1815 07/21/19 1831  BP: 132/85 127/78 130/87 117/82  Pulse: 89 86 97 92  Resp:      TempSrc:      SpO2:      Weight:      Height:        Physical Exam  Constitutional: She is oriented to person, place, and time.  HENT:  Head: Normocephalic and atraumatic.  Eyes: Conjunctivae are normal.  Cardiovascular: Regular rhythm and normal heart sounds.  Respiratory: Breath sounds normal.  GI: Normal appearance.  Gravid--fundal height appears AGA, Soft, NT   Musculoskeletal:     Cervical back: Normal range of motion.  Neurological: She is alert and oriented to person, place, and time.  Skin: Skin is warm and dry.  Psychiatric: Her behavior is normal. Mood and thought content normal.    Fetal Assessment 140 bpm, Mod Var, -Decels, +Accels Toco: Q1-39min, palpates mild  MAU Course   Results for orders placed or performed during  the hospital encounter of 07/21/19 (from the past 24 hour(s))  Urinalysis, Routine w reflex microscopic     Status: Abnormal   Collection Time: 07/21/19  5:00 PM  Result Value Ref Range   Color, Urine STRAW (A) YELLOW   APPearance CLEAR CLEAR   Specific Gravity, Urine 1.003 (L) 1.005 - 1.030   pH 6.0 5.0 - 8.0   Glucose, UA NEGATIVE NEGATIVE mg/dL   Hgb urine dipstick NEGATIVE NEGATIVE   Bilirubin Urine NEGATIVE NEGATIVE   Ketones, ur NEGATIVE NEGATIVE mg/dL   Protein, ur NEGATIVE NEGATIVE mg/dL   Nitrite NEGATIVE NEGATIVE   Leukocytes,Ua NEGATIVE NEGATIVE  Comprehensive metabolic panel     Status: Abnormal   Collection Time: 07/21/19  5:00 PM  Result Value Ref Range   Sodium 136 135 - 145 mmol/L   Potassium 3.6 3.5 - 5.1 mmol/L   Chloride 109 98 - 111 mmol/L   CO2 18 (L) 22 - 32 mmol/L   Glucose, Bld 80 70 - 99 mg/dL   BUN 6 6 - 20 mg/dL   Creatinine, Ser 2.58 0.44 -  1.00 mg/dL   Calcium 8.6 (L) 8.9 - 10.3 mg/dL   Total Protein 6.3 (L) 6.5 - 8.1 g/dL   Albumin 2.5 (L) 3.5 - 5.0 g/dL   AST 21 15 - 41 U/L   ALT 37 0 - 44 U/L   Alkaline Phosphatase 146 (H) 38 - 126 U/L   Total Bilirubin 0.5 0.3 - 1.2 mg/dL   GFR calc non Af Amer >60 >60 mL/min   GFR calc Af Amer >60 >60 mL/min   Anion gap 9 5 - 15  CBC     Status: Abnormal   Collection Time: 07/21/19  5:00 PM  Result Value Ref Range   WBC 8.3 4.0 - 10.5 K/uL   RBC 3.73 (L) 3.87 - 5.11 MIL/uL   Hemoglobin 11.5 (L) 12.0 - 15.0 g/dL   HCT 52.7 (L) 36 - 46 %   MCV 91.2 80.0 - 100.0 fL   MCH 30.8 26.0 - 34.0 pg   MCHC 33.8 30.0 - 36.0 g/dL   RDW 78.2 42.3 - 53.6 %   Platelets 195 150 - 400 K/uL   nRBC 0.0 0.0 - 0.2 %  Protein / creatinine ratio, urine     Status: Abnormal   Collection Time: 07/21/19  5:00 PM  Result Value Ref Range   Creatinine, Urine 22.18 mg/dL   Total Protein, Urine 7 mg/dL   Protein Creatinine Ratio 0.32 (H) 0.00 - 0.15 mg/mg[Cre]   No results found.  MDM Physical Exam Labs: CBC, CMP, PC Ratio Measure BPQ15 min EFM Assessment and Plan  24 year old G1P0  SIUP at 35.2weeks Cat I FT gHTN  -POC reviewed -Labs ordered. -NST reactive.  -Will await results.   Cherre Robins MSN, CNM 07/21/2019, 4:58 PM   Reassessment (6:30 PM)  -Labs return as above. -Patient remains asymptomatic and BP remain normotensive with exception of initial upon arrival of 136/93. -Dr. Lonia Farber consulted and advised:  *Give diagnosis of PreEclampsia *Discharge to home with follow up BP check on Friday. -Provider to bedside to discuss POC. -Patient given information on PreEclampsia to share with her husband. -Will also give BMZ. -Message sent to office for repeat BMZ tomorrow after MFM appt if possible as patient with known transportation issues. -Informed that if she is unable to get shot tomorrow should plan to return to MAU tomorrow evening or Friday morning. -Nurse instructed to  reiterate  precautions: PreEclampsia, Bleeding, and Labor. -Encouraged to call or return to MAU if symptoms worsen or with the onset of new symptoms. -Discharged to home in stable condition.  Cherre Robins MSN, CNM Advanced Practice Provider, [facility]

## 2019-07-21 NOTE — Discharge Instructions (Signed)
Preeclampsia and Eclampsia Preeclampsia is a serious condition that may develop during pregnancy. This condition causes high blood pressure and increased protein in your urine along with other symptoms, such as headaches and vision changes. These symptoms may develop as the condition gets worse. Preeclampsia may occur at 20 weeks of pregnancy or later. Diagnosing and treating preeclampsia early is very important. If not treated early, it can cause serious problems for you and your baby. One problem it can lead to is eclampsia. Eclampsia is a condition that causes muscle jerking or shaking (convulsions or seizures) and other serious problems for the mother. During pregnancy, delivering your baby may be the best treatment for preeclampsia or eclampsia. For most women, preeclampsia and eclampsia symptoms go away after giving birth. In rare cases, a woman may develop preeclampsia after giving birth (postpartum preeclampsia). This usually occurs within 48 hours after childbirth but may occur up to 6 weeks after giving birth. What are the causes? The cause of preeclampsia is not known. What increases the risk? The following risk factors make you more likely to develop preeclampsia:  Being pregnant for the first time.  Having had preeclampsia during a past pregnancy.  Having a family history of preeclampsia.  Having high blood pressure.  Being pregnant with more than one baby.  Being 35 or older.  Being African-American.  Having kidney disease or diabetes.  Having medical conditions such as lupus or blood diseases.  Being very overweight (obese). What are the signs or symptoms? The most common symptoms are:  Severe headaches.  Vision problems, such as blurred or double vision.  Abdominal pain, especially upper abdominal pain. Other symptoms that may develop as the condition gets worse include:  Sudden weight gain.  Sudden swelling of the hands, face, legs, and feet.  Severe nausea  and vomiting.  Numbness in the face, arms, legs, and feet.  Dizziness.  Urinating less than usual.  Slurred speech.  Convulsions or seizures. How is this diagnosed? There are no screening tests for preeclampsia. Your health care provider will ask you about symptoms and check for signs of preeclampsia during your prenatal visits. You may also have tests that include:  Checking your blood pressure.  Urine tests to check for protein. Your health care provider will check for this at every prenatal visit.  Blood tests.  Monitoring your baby's heart rate.  Ultrasound. How is this treated? You and your health care provider will determine the treatment approach that is best for you. Treatment may include:  Having more frequent prenatal exams to check for signs of preeclampsia, if you have an increased risk for preeclampsia.  Medicine to lower your blood pressure.  Staying in the hospital, if your condition is severe. There, treatment will focus on controlling your blood pressure and the amount of fluids in your body (fluid retention).  Taking medicine (magnesium sulfate) to prevent seizures. This may be given as an injection or through an IV.  Taking a low-dose aspirin during your pregnancy.  Delivering your baby early. You may have your labor started with medicine (induced), or you may have a cesarean delivery. Follow these instructions at home: Eating and drinking   Drink enough fluid to keep your urine pale yellow.  Avoid caffeine. Lifestyle  Do not use any products that contain nicotine or tobacco, such as cigarettes and e-cigarettes. If you need help quitting, ask your health care provider.  Do not use alcohol or drugs.  Avoid stress as much as possible. Rest and get   plenty of sleep. General instructions  Take over-the-counter and prescription medicines only as told by your health care provider.  When lying down, lie on your left side. This keeps pressure off your  major blood vessels.  When sitting or lying down, raise (elevate) your feet. Try putting some pillows underneath your lower legs.  Exercise regularly. Ask your health care provider what kinds of exercise are best for you.  Keep all follow-up and prenatal visits as told by your health care provider. This is important. How is this prevented? There is no known way of preventing preeclampsia or eclampsia from developing. However, to lower your risk of complications and detect problems early:  Get regular prenatal care. Your health care provider may be able to diagnose and treat the condition early.  Maintain a healthy weight. Ask your health care provider for help managing weight gain during pregnancy.  Work with your health care provider to manage any long-term (chronic) health conditions you have, such as diabetes or kidney problems.  You may have tests of your blood pressure and kidney function after giving birth.  Your health care provider may have you take low-dose aspirin during your next pregnancy. Contact a health care provider if:  You have symptoms that your health care provider told you may require more treatment or monitoring, such as: ? Headaches. ? Nausea or vomiting. ? Abdominal pain. ? Dizziness. ? Light-headedness. Get help right away if:  You have severe: ? Abdominal pain. ? Headaches that do not get better. ? Dizziness. ? Vision problems. ? Confusion. ? Nausea or vomiting.  You have any of the following: ? A seizure. ? Sudden, rapid weight gain. ? Sudden swelling in your hands, ankles, or face. ? Trouble moving any part of your body. ? Numbness in any part of your body. ? Trouble speaking. ? Abnormal bleeding.  You faint. Summary  Preeclampsia is a serious condition that may develop during pregnancy.  This condition causes high blood pressure and increased protein in your urine along with other symptoms, such as headaches and vision  changes.  Diagnosing and treating preeclampsia early is very important. If not treated early, it can cause serious problems for you and your baby.  Get help right away if you have symptoms that your health care provider told you to watch for. This information is not intended to replace advice given to you by your health care provider. Make sure you discuss any questions you have with your health care provider. Document Revised: 09/23/2017 Document Reviewed: 08/28/2015 Elsevier Patient Education  2020 [employer]  

## 2019-07-22 ENCOUNTER — Inpatient Hospital Stay (HOSPITAL_COMMUNITY)
Admission: AD | Admit: 2019-07-22 | Discharge: 2019-07-22 | Disposition: A | Discharge status: Home / Self Care | Payer: Medicaid Other | Attending: Family Medicine | Admitting: Family Medicine

## 2019-07-22 ENCOUNTER — Other Ambulatory Visit: Payer: Self-pay

## 2019-07-22 ENCOUNTER — Ambulatory Visit: Payer: Medicaid Other | Admitting: *Deleted

## 2019-07-22 ENCOUNTER — Ambulatory Visit (HOSPITAL_BASED_OUTPATIENT_CLINIC_OR_DEPARTMENT_OTHER): Payer: Medicaid Other

## 2019-07-22 ENCOUNTER — Other Ambulatory Visit: Payer: Self-pay | Admitting: Maternal & Fetal Medicine

## 2019-07-22 DIAGNOSIS — O365930 Maternal care for other known or suspected poor fetal growth, third trimester, not applicable or unspecified: Secondary | ICD-10-CM | POA: Diagnosis not present

## 2019-07-22 DIAGNOSIS — O0990 Supervision of high risk pregnancy, unspecified, unspecified trimester: Secondary | ICD-10-CM

## 2019-07-22 DIAGNOSIS — O0933 Supervision of pregnancy with insufficient antenatal care, third trimester: Secondary | ICD-10-CM | POA: Diagnosis not present

## 2019-07-22 DIAGNOSIS — O1493 Unspecified pre-eclampsia, third trimester: Secondary | ICD-10-CM | POA: Diagnosis not present

## 2019-07-22 DIAGNOSIS — O1413 Severe pre-eclampsia, third trimester: Secondary | ICD-10-CM | POA: Insufficient documentation

## 2019-07-22 DIAGNOSIS — Z3A35 35 weeks gestation of pregnancy: Secondary | ICD-10-CM

## 2019-07-22 DIAGNOSIS — O1403 Mild to moderate pre-eclampsia, third trimester: Secondary | ICD-10-CM

## 2019-07-22 MED ORDER — BETAMETHASONE SOD PHOS & ACET 6 (3-3) MG/ML IJ SUSP
12.0000 mg | Freq: Once | INTRAMUSCULAR | Status: AC
  Administered 2019-07-22: 12 mg via INTRAMUSCULAR
  Filled 2019-07-22: qty 5

## 2019-07-22 NOTE — Discharge Instructions (Signed)
Preeclampsia and Eclampsia Preeclampsia is a serious condition that may develop during pregnancy. This condition causes high blood pressure and increased protein in your urine along with other symptoms, such as headaches and vision changes. These symptoms may develop as the condition gets worse. Preeclampsia may occur at 20 weeks of pregnancy or later. Diagnosing and treating preeclampsia early is very important. If not treated early, it can cause serious problems for you and your baby. One problem it can lead to is eclampsia. Eclampsia is a condition that causes muscle jerking or shaking (convulsions or seizures) and other serious problems for the mother. During pregnancy, delivering your baby may be the best treatment for preeclampsia or eclampsia. For most women, preeclampsia and eclampsia symptoms go away after giving birth. In rare cases, a woman may develop preeclampsia after giving birth (postpartum preeclampsia). This usually occurs within 48 hours after childbirth but may occur up to 6 weeks after giving birth. What are the causes? The cause of preeclampsia is not known. What increases the risk? The following risk factors make you more likely to develop preeclampsia:  Being pregnant for the first time.  Having had preeclampsia during a past pregnancy.  Having a family history of preeclampsia.  Having high blood pressure.  Being pregnant with more than one baby.  Being 35 or older.  Being African-American.  Having kidney disease or diabetes.  Having medical conditions such as lupus or blood diseases.  Being very overweight (obese). What are the signs or symptoms? The most common symptoms are:  Severe headaches.  Vision problems, such as blurred or double vision.  Abdominal pain, especially upper abdominal pain. Other symptoms that may develop as the condition gets worse include:  Sudden weight gain.  Sudden swelling of the hands, face, legs, and feet.  Severe nausea  and vomiting.  Numbness in the face, arms, legs, and feet.  Dizziness.  Urinating less than usual.  Slurred speech.  Convulsions or seizures. How is this diagnosed? There are no screening tests for preeclampsia. Your health care provider will ask you about symptoms and check for signs of preeclampsia during your prenatal visits. You may also have tests that include:  Checking your blood pressure.  Urine tests to check for protein. Your health care provider will check for this at every prenatal visit.  Blood tests.  Monitoring your baby's heart rate.  Ultrasound. How is this treated? You and your health care provider will determine the treatment approach that is best for you. Treatment may include:  Having more frequent prenatal exams to check for signs of preeclampsia, if you have an increased risk for preeclampsia.  Medicine to lower your blood pressure.  Staying in the hospital, if your condition is severe. There, treatment will focus on controlling your blood pressure and the amount of fluids in your body (fluid retention).  Taking medicine (magnesium sulfate) to prevent seizures. This may be given as an injection or through an IV.  Taking a low-dose aspirin during your pregnancy.  Delivering your baby early. You may have your labor started with medicine (induced), or you may have a cesarean delivery. Follow these instructions at home: Eating and drinking   Drink enough fluid to keep your urine pale yellow.  Avoid caffeine. Lifestyle  Do not use any products that contain nicotine or tobacco, such as cigarettes and e-cigarettes. If you need help quitting, ask your health care provider.  Do not use alcohol or drugs.  Avoid stress as much as possible. Rest and get   plenty of sleep. General instructions  Take over-the-counter and prescription medicines only as told by your health care provider.  When lying down, lie on your left side. This keeps pressure off your  major blood vessels.  When sitting or lying down, raise (elevate) your feet. Try putting some pillows underneath your lower legs.  Exercise regularly. Ask your health care provider what kinds of exercise are best for you.  Keep all follow-up and prenatal visits as told by your health care provider. This is important. How is this prevented? There is no known way of preventing preeclampsia or eclampsia from developing. However, to lower your risk of complications and detect problems early:  Get regular prenatal care. Your health care provider may be able to diagnose and treat the condition early.  Maintain a healthy weight. Ask your health care provider for help managing weight gain during pregnancy.  Work with your health care provider to manage any long-term (chronic) health conditions you have, such as diabetes or kidney problems.  You may have tests of your blood pressure and kidney function after giving birth.  Your health care provider may have you take low-dose aspirin during your next pregnancy. Contact a health care provider if:  You have symptoms that your health care provider told you may require more treatment or monitoring, such as: ? Headaches. ? Nausea or vomiting. ? Abdominal pain. ? Dizziness. ? Light-headedness. Get help right away if:  You have severe: ? Abdominal pain. ? Headaches that do not get better. ? Dizziness. ? Vision problems. ? Confusion. ? Nausea or vomiting.  You have any of the following: ? A seizure. ? Sudden, rapid weight gain. ? Sudden swelling in your hands, ankles, or face. ? Trouble moving any part of your body. ? Numbness in any part of your body. ? Trouble speaking. ? Abnormal bleeding.  You faint. Summary  Preeclampsia is a serious condition that may develop during pregnancy.  This condition causes high blood pressure and increased protein in your urine along with other symptoms, such as headaches and vision  changes.  Diagnosing and treating preeclampsia early is very important. If not treated early, it can cause serious problems for you and your baby.  Get help right away if you have symptoms that your health care provider told you to watch for. This information is not intended to replace advice given to you by your health care provider. Make sure you discuss any questions you have with your health care provider. Document Revised: 09/23/2017 Document Reviewed: 08/28/2015 Elsevier Patient Education  2020 [employer]  

## 2019-07-22 NOTE — MAU Note (Addendum)
Pt here for 2nd dose of Betamethasone. Denies any problems, no headaches, visual changes, epigastric pain.  Denies pain, bleeding or LOF.  Reports +FM.  Asked for BP to be checked.

## 2019-07-22 NOTE — MAU Provider Note (Signed)
First Provider Initiated Contact with Patient 07/22/19 1850      S Ms. Glendale Sockwell is a 24 y.o. G1P0 pregnant female who presents to MAU today for repeat BMZ dosing.  Nurse reports BP taken by patient request and elevated, but lower than bp in office today.  Provider to bedside. Patient continues to deny HA, visual disturbances, and RUQ pain.  Patient endorses fetal movement and denies vaginal concerns.     O BP (!) 142/97 (BP Location: Right Arm)   Pulse (!) 101   Resp 18   LMP 11/03/2018 (Exact Date)   SpO2 100%  Physical Exam  Nursing note and vitals reviewed. Constitutional: She is oriented to person, place, and time.  Eyes: Conjunctivae are normal.  Cardiovascular: Normal rate.  GI: Normal appearance.  Musculoskeletal:        General: Normal range of motion.  Neurological: She is alert and oriented to person, place, and time.  Psychiatric: Mood and thought content normal.    A 24 year old G1P0 at 35.3 weeks PreEclampsia w/o Severe Features Medical screening exam complete   P -MFM report reviewed and discussed recommendation for admission with daily NST and delivery as appropriate. -Patient declines stating she will take her bp at home. -Informed that she has a follow up appt scheduled for a BP check in office tomorrow at [phone]. -Discussed that if BP is elevated will plan for admission and delivery. -Precautions given; PreEclampsia, Fetal Movement, and Labor -Discharge from MAU in stable condition.  Gerrit Heck, CNM 07/22/2019 6:50 PM

## 2019-07-23 ENCOUNTER — Ambulatory Visit (INDEPENDENT_AMBULATORY_CARE_PROVIDER_SITE_OTHER): Payer: Medicaid Other | Admitting: *Deleted

## 2019-07-23 VITALS — BP 138/94 | HR 116 | Ht 62.0 in | Wt 162.7 lb

## 2019-07-23 DIAGNOSIS — O133 Gestational [pregnancy-induced] hypertension without significant proteinuria, third trimester: Secondary | ICD-10-CM

## 2019-07-23 DIAGNOSIS — Z0130 Encounter for examination of blood pressure without abnormal findings: Secondary | ICD-10-CM

## 2019-07-23 NOTE — Progress Notes (Signed)
Patient was assessed and managed by nursing staff during this encounter. I have reviewed the chart and agree with the documentation and plan. I have also made any necessary editorial changes.  Drevon Plog, MD 07/23/2019 11:23 AM 

## 2019-07-23 NOTE — Progress Notes (Signed)
Pt presents for BP check. She denies H/A or visual disturbances. BP 138/94, P- 116. Pt is followed for weekly BPP by MFM, was seen yesterday and next appt is 6/23. Her next scheduled visit in this office is 08/04/19. Pt instructed to go to MAU if she develops H/A or visual disturbances which do not resolve with Tylenol and/or brief rest. Pt also advised to go to MAU if decreased fetal movement occurs. She voiced understanding of all information and instructions given.

## 2019-07-28 ENCOUNTER — Ambulatory Visit: Payer: Medicaid Other | Admitting: *Deleted

## 2019-07-28 ENCOUNTER — Other Ambulatory Visit: Payer: Self-pay | Admitting: Maternal & Fetal Medicine

## 2019-07-28 ENCOUNTER — Ambulatory Visit: Payer: Medicaid Other | Attending: Obstetrics and Gynecology

## 2019-07-28 ENCOUNTER — Other Ambulatory Visit: Payer: Self-pay

## 2019-07-28 DIAGNOSIS — O0933 Supervision of pregnancy with insufficient antenatal care, third trimester: Secondary | ICD-10-CM | POA: Diagnosis not present

## 2019-07-28 DIAGNOSIS — O365930 Maternal care for other known or suspected poor fetal growth, third trimester, not applicable or unspecified: Secondary | ICD-10-CM | POA: Diagnosis not present

## 2019-07-28 DIAGNOSIS — O1493 Unspecified pre-eclampsia, third trimester: Secondary | ICD-10-CM | POA: Diagnosis not present

## 2019-07-28 DIAGNOSIS — Z3A36 36 weeks gestation of pregnancy: Secondary | ICD-10-CM | POA: Diagnosis not present

## 2019-07-28 DIAGNOSIS — O0990 Supervision of high risk pregnancy, unspecified, unspecified trimester: Secondary | ICD-10-CM | POA: Insufficient documentation

## 2019-07-29 ENCOUNTER — Encounter: Payer: Self-pay | Admitting: Obstetrics and Gynecology

## 2019-07-29 ENCOUNTER — Other Ambulatory Visit: Payer: Self-pay | Admitting: *Deleted

## 2019-07-29 ENCOUNTER — Other Ambulatory Visit (HOSPITAL_COMMUNITY): Payer: Self-pay | Admitting: Advanced Practice Midwife

## 2019-07-29 NOTE — Progress Notes (Signed)
error 

## 2019-07-29 NOTE — Progress Notes (Signed)
Induction Assessment Scheduling Form: Fax to [facility]:  [phone] Route to [facility] Labor Delivery   Christie Parker                                                                                   DOB:  [birth year]                                                            MRN:  [id number]  Phone:  Home Phone [phone]  Mobile [phone]    Provider:  [facility] (Faculty Practice)  Admission Date/Time:  08/02/19 GP:  G1P0     Gestational age on admission:  37w                                                Estimated Date of Delivery: 08/23/19  Dating Criteria: [place] at 29 weeks  There were no vitals filed for this visit.  GBS:   HIV:     Reason for induction:  Severe IUGR, pre-eclampsia, abnormal fetal dopplers Scheduling Provider Signature:  Conan Bowens, MD         Method of induction(proposed):  Cytotec   Scheduling Provider Signature:  Conan Bowens, MD                                            Today's Date:  07/29/2019

## 2019-07-30 ENCOUNTER — Other Ambulatory Visit (HOSPITAL_COMMUNITY)
Admission: RE | Admit: 2019-07-30 | Discharge: 2019-07-30 | Disposition: A | Discharge status: Home / Self Care | Payer: Medicaid Other | Source: Ambulatory Visit | Attending: Obstetrics & Gynecology | Admitting: Obstetrics & Gynecology

## 2019-07-30 ENCOUNTER — Ambulatory Visit: Payer: Medicaid Other | Attending: Obstetrics and Gynecology | Admitting: *Deleted

## 2019-07-30 ENCOUNTER — Other Ambulatory Visit (HOSPITAL_COMMUNITY): Payer: Self-pay | Admitting: Advanced Practice Midwife

## 2019-07-30 ENCOUNTER — Ambulatory Visit (INDEPENDENT_AMBULATORY_CARE_PROVIDER_SITE_OTHER): Payer: Medicaid Other | Admitting: Obstetrics & Gynecology

## 2019-07-30 ENCOUNTER — Ambulatory Visit: Payer: Medicaid Other | Admitting: *Deleted

## 2019-07-30 ENCOUNTER — Other Ambulatory Visit: Payer: Self-pay

## 2019-07-30 ENCOUNTER — Encounter: Payer: Self-pay | Admitting: Obstetrics & Gynecology

## 2019-07-30 VITALS — BP 139/92 | HR 110 | Wt 160.1 lb

## 2019-07-30 VITALS — BP 138/91 | HR 105

## 2019-07-30 DIAGNOSIS — Z3A36 36 weeks gestation of pregnancy: Secondary | ICD-10-CM | POA: Diagnosis not present

## 2019-07-30 DIAGNOSIS — O0993 Supervision of high risk pregnancy, unspecified, third trimester: Secondary | ICD-10-CM | POA: Diagnosis not present

## 2019-07-30 DIAGNOSIS — O0990 Supervision of high risk pregnancy, unspecified, unspecified trimester: Secondary | ICD-10-CM

## 2019-07-30 DIAGNOSIS — O365930 Maternal care for other known or suspected poor fetal growth, third trimester, not applicable or unspecified: Secondary | ICD-10-CM | POA: Insufficient documentation

## 2019-07-30 DIAGNOSIS — O365990 Maternal care for other known or suspected poor fetal growth, unspecified trimester, not applicable or unspecified: Secondary | ICD-10-CM

## 2019-07-30 DIAGNOSIS — O365931 Maternal care for other known or suspected poor fetal growth, third trimester, fetus 1: Secondary | ICD-10-CM

## 2019-07-30 DIAGNOSIS — O1493 Unspecified pre-eclampsia, third trimester: Secondary | ICD-10-CM | POA: Diagnosis not present

## 2019-07-30 NOTE — Patient Instructions (Signed)
Labor Induction  Labor induction is when steps are taken to cause a pregnant woman to begin the labor process. Most women go into labor on their own between 37 weeks and 42 weeks of pregnancy. When this does not happen or when there is a medical need for labor to begin, steps may be taken to induce labor. Labor induction causes a pregnant woman's uterus to contract. It also causes the cervix to soften (ripen), open (dilate), and thin out (efface). Usually, labor is not induced before 39 weeks of pregnancy unless there is a medical reason to do so. Your health care provider will determine if labor induction is needed. Before inducing labor, your health care provider will consider a number of factors, including:  Your medical condition and your baby's.  How many weeks along you are in your pregnancy.  How mature your baby's lungs are.  The condition of your cervix.  The position of your baby.  The size of your birth canal. What are some reasons for labor induction? Labor may be induced if:  Your health or your baby's health is at risk.  Your pregnancy is overdue by 1 week or more.  Your water breaks but labor does not start on its own.  There is a low amount of amniotic fluid around your baby. You may also choose (elect) to have labor induced at a certain time. Generally, elective labor induction is done no earlier than 39 weeks of pregnancy. What methods are used for labor induction? Methods used for labor induction include:  Prostaglandin medicine. This medicine starts contractions and causes the cervix to dilate and ripen. It can be taken by mouth (orally) or by being inserted into the vagina (suppository).  Inserting a small, thin tube (catheter) with a balloon into the vagina and then expanding the balloon with water to dilate the cervix.  Stripping the membranes. In this method, your health care provider gently separates amniotic sac tissue from the cervix. This causes the  cervix to stretch, which in turn causes the release of a hormone called progesterone. The hormone causes the uterus to contract. This procedure is often done during an office visit, after which you will be sent home to wait for contractions to begin.  Breaking the water. In this method, your health care provider uses a small instrument to make a small hole in the amniotic sac. This eventually causes the amniotic sac to break. Contractions should begin after a few hours.  Medicine to trigger or strengthen contractions. This medicine is given through an IV that is inserted into a vein in your arm. Except for membrane stripping, which can be done in a clinic, labor induction is done in the hospital so that you and your baby can be carefully monitored. How long does it take for labor to be induced? The length of time it takes to induce labor depends on how ready your body is for labor. Some inductions can take up to 2-3 days, while others may take less than a day. Induction may take longer if:  You are induced early in your pregnancy.  It is your first pregnancy.  Your cervix is not ready. What are some risks associated with labor induction? Some risks associated with labor induction include:  Changes in fetal heart rate, such as being too high, too low, or irregular (erratic).  Failed induction.  Infection in the mother or the baby.  Increased risk of having a cesarean delivery.  Fetal death.  Breaking off (abruption)   of the placenta from the uterus (rare).  Rupture of the uterus (very rare). When induction is needed for medical reasons, the benefits of induction generally outweigh the risks. What are some reasons for not inducing labor? Labor induction should not be done if:  Your baby does not tolerate contractions.  You have had previous surgeries on your uterus, such as a myomectomy, removal of fibroids, or a vertical scar from a previous cesarean delivery.  Your placenta lies  very low in your uterus and blocks the opening of the cervix (placenta previa).  Your baby is not in a head-down position.  The umbilical cord drops down into the birth canal in front of the baby.  There are unusual circumstances, such as the baby being very early (premature).  You have had more than 2 previous cesarean deliveries. Summary  Labor induction is when steps are taken to cause a pregnant woman to begin the labor process.  Labor induction causes a pregnant woman's uterus to contract. It also causes the cervix to ripen, dilate, and efface.  Labor is not induced before 39 weeks of pregnancy unless there is a medical reason to do so.  When induction is needed for medical reasons, the benefits of induction generally outweigh the risks. This information is not intended to replace advice given to you by your health care provider. Make sure you discuss any questions you have with your health care provider. Document Revised: 01/24/2017 Document Reviewed: 03/06/2016 Elsevier Patient Education  2020 [employer]    Preeclampsia and Eclampsia Preeclampsia is a serious condition that may develop during pregnancy. This condition causes high blood pressure and increased protein in your urine along with other symptoms, such as headaches and vision changes. These symptoms may develop as the condition gets worse. Preeclampsia may occur at 20 weeks of pregnancy or later. Diagnosing and treating preeclampsia early is very important. If not treated early, it can cause serious problems for you and your baby. One problem it can lead to is eclampsia. Eclampsia is a condition that causes muscle jerking or shaking (convulsions or seizures) and other serious problems for the mother. During pregnancy, delivering your baby may be the best treatment for preeclampsia or eclampsia. For most women, preeclampsia and eclampsia symptoms go away after giving birth. In rare cases, a woman may develop preeclampsia  after giving birth (postpartum preeclampsia). This usually occurs within 48 hours after childbirth but may occur up to 6 weeks after giving birth. What are the causes? The cause of preeclampsia is not known. What increases the risk? The following risk factors make you more likely to develop preeclampsia:  Being pregnant for the first time.  Having had preeclampsia during a past pregnancy.  Having a family history of preeclampsia.  Having high blood pressure.  Being pregnant with more than one baby.  Being 73 or older.  Being African-American.  Having kidney disease or diabetes.  Having medical conditions such as lupus or blood diseases.  Being very overweight (obese). What are the signs or symptoms? The most common symptoms are:  Severe headaches.  Vision problems, such as blurred or double vision.  Abdominal pain, especially upper abdominal pain. Other symptoms that may develop as the condition gets worse include:  Sudden weight gain.  Sudden swelling of the hands, face, legs, and feet.  Severe nausea and vomiting.  Numbness in the face, arms, legs, and feet.  Dizziness.  Urinating less than usual.  Slurred speech.  Convulsions or seizures. How is this diagnosed?  There are no screening tests for preeclampsia. Your health care provider will ask you about symptoms and check for signs of preeclampsia during your prenatal visits. You may also have tests that include:  Checking your blood pressure.  Urine tests to check for protein. Your health care provider will check for this at every prenatal visit.  Blood tests.  Monitoring your baby's heart rate.  Ultrasound. How is this treated? You and your health care provider will determine the treatment approach that is best for you. Treatment may include:  Having more frequent prenatal exams to check for signs of preeclampsia, if you have an increased risk for preeclampsia.  Medicine to lower your blood  pressure.  Staying in the hospital, if your condition is severe. There, treatment will focus on controlling your blood pressure and the amount of fluids in your body (fluid retention).  Taking medicine (magnesium sulfate) to prevent seizures. This may be given as an injection or through an IV.  Taking a low-dose aspirin during your pregnancy.  Delivering your baby early. You may have your labor started with medicine (induced), or you may have a cesarean delivery. Follow these instructions at home: Eating and drinking   Drink enough fluid to keep your urine pale yellow.  Avoid caffeine. Lifestyle  Do not use any products that contain nicotine or tobacco, such as cigarettes and e-cigarettes. If you need help quitting, ask your health care provider.  Do not use alcohol or drugs.  Avoid stress as much as possible. Rest and get plenty of sleep. General instructions  Take over-the-counter and prescription medicines only as told by your health care provider.  When lying down, lie on your left side. This keeps pressure off your major blood vessels.  When sitting or lying down, raise (elevate) your feet. Try putting some pillows underneath your lower legs.  Exercise regularly. Ask your health care provider what kinds of exercise are best for you.  Keep all follow-up and prenatal visits as told by your health care provider. This is important. How is this prevented? There is no known way of preventing preeclampsia or eclampsia from developing. However, to lower your risk of complications and detect problems early:  Get regular prenatal care. Your health care provider may be able to diagnose and treat the condition early.  Maintain a healthy weight. Ask your health care provider for help managing weight gain during pregnancy.  Work with your health care provider to manage any long-term (chronic) health conditions you have, such as diabetes or kidney problems.  You may have tests of  your blood pressure and kidney function after giving birth.  Your health care provider may have you take low-dose aspirin during your next pregnancy. Contact a health care provider if:  You have symptoms that your health care provider told you may require more treatment or monitoring, such as: ? Headaches. ? Nausea or vomiting. ? Abdominal pain. ? Dizziness. ? Light-headedness. Get help right away if:  You have severe: ? Abdominal pain. ? Headaches that do not get better. ? Dizziness. ? Vision problems. ? Confusion. ? Nausea or vomiting.  You have any of the following: ? A seizure. ? Sudden, rapid weight gain. ? Sudden swelling in your hands, ankles, or face. ? Trouble moving any part of your body. ? Numbness in any part of your body. ? Trouble speaking. ? Abnormal bleeding.  You faint. Summary  Preeclampsia is a serious condition that may develop during pregnancy.  This condition causes high blood  pressure and increased protein in your urine along with other symptoms, such as headaches and vision changes.  Diagnosing and treating preeclampsia early is very important. If not treated early, it can cause serious problems for you and your baby.  Get help right away if you have symptoms that your health care provider told you to watch for. This information is not intended to replace advice given to you by your health care provider. Make sure you discuss any questions you have with your health care provider. Document Revised: 09/23/2017 Document Reviewed: 08/28/2015 Elsevier Patient Education  2020 [employer].

## 2019-07-30 NOTE — Procedures (Signed)
Talea Perman Jan 14, 1996 [redacted]w[redacted]d  Fetus A Non-Stress Test Interpretation for 07/30/19  Indication: IUGR  Fetal Heart Rate A Mode: External Baseline Rate (A): 150 bpm Variability: Moderate, Minimal Accelerations: 15 x 15 Decelerations: None Multiple birth?: No  Uterine Activity Mode: Palpation, Toco Contraction Frequency (min): 4-11 with U/I Contraction Duration (sec): 80-120 Contraction Quality: Mild (denies feeling any) Resting Tone Palpated: Relaxed Resting Time: Adequate  Interpretation (Fetal Testing) Nonstress Test Interpretation: Reactive Overall Impression: Reassuring for gestational age Comments: Reviewed tracing with Dr. Parke Poisson

## 2019-07-30 NOTE — Progress Notes (Signed)
   PRENATAL VISIT NOTE  Subjective:  Christie Parker is a 24 y.o. G1P0 at [redacted]w[redacted]d being seen today for ongoing prenatal care.  She is currently monitored for the following issues for this high-risk pregnancy and has IUGR (intrauterine growth restriction) affecting care of mother; Supervision of high risk pregnancy, antepartum; and Preeclampsia, third trimester on their problem list.  Patient reports no complaints. Patient denies any headaches, visual symptoms, RUQ/epigastric pain or other concerning symptoms.  Contractions: Not present. Vag. Bleeding: None.  Movement: Present. Denies leaking of fluid.   The following portions of the patient's history were reviewed and updated as appropriate: allergies, current medications, past family history, past medical history, past social history, past surgical history and problem list.   Objective:   Vitals:   07/30/19 0826  BP: (!) 139/92  Pulse: (!) 110  Weight: 160 lb 1.6 oz (72.6 kg)    Fetal Status:     Movement: Present     General:  Alert, oriented and cooperative. Patient is in no acute distress.  Skin: Skin is warm and dry. No rash noted.   Cardiovascular: Normal heart rate noted  Respiratory: Normal respiratory effort, no problems with respiration noted  Abdomen: Soft, gravid, appropriate for gestational age.  Pain/Pressure: Present     Pelvic: Exam performed in the presence of a chaperone        Extremities: Normal range of motion.  Edema: None  Mental Status: Normal mood and affect. Normal behavior. Normal judgment and thought content.   Assessment and Plan:  Pregnancy: G1P0 at [redacted]w[redacted]d 1. Preeclampsia, third trimester 2. Poor fetal growth affecting management of mother in third trimester, single or unspecified fetus 3. [redacted] weeks gestation of pregnancy 4. Supervision of high risk pregnancy, antepartum Patient is already scheduled for IOL on 08/02/19. Orders placed.  Pelvic cultures done today. Severe preeclampsia precautions reviewed. -  Strep Gp B NAA - GC/Chlamydia probe amp ([place])not at [facility] Preterm labor symptoms and general obstetric precautions including but not limited to vaginal bleeding, contractions, leaking of fluid and fetal movement were reviewed in detail with the patient. Please refer to After Visit Summary for other counseling recommendations.   Return for postpartum follow up (to be arranged later).  Future Appointments  Date Time Provider Department Center  07/30/2019  8:50 AM [facility] NURSE [facility]  08/02/2019  7:00 AM [facility] SCHED ROOM [facility] None  09/22/2019 12:30 PM [facility] [place] [facility]    Jaynie Collins, MD

## 2019-08-01 LAB — STREP GP B NAA: Strep Gp B NAA: NEGATIVE

## 2019-08-02 ENCOUNTER — Other Ambulatory Visit: Payer: Self-pay

## 2019-08-02 ENCOUNTER — Inpatient Hospital Stay (HOSPITAL_COMMUNITY): Payer: Medicaid Other | Admitting: Anesthesiology

## 2019-08-02 ENCOUNTER — Inpatient Hospital Stay (HOSPITAL_COMMUNITY)
Admission: AD | Admit: 2019-08-02 | Discharge: 2019-08-04 | DRG: 807 | Disposition: A | Discharge status: Home / Self Care | Payer: Medicaid Other | Attending: Obstetrics and Gynecology | Admitting: Obstetrics and Gynecology

## 2019-08-02 ENCOUNTER — Inpatient Hospital Stay (HOSPITAL_COMMUNITY): Payer: Medicaid Other

## 2019-08-02 ENCOUNTER — Encounter (HOSPITAL_COMMUNITY): Payer: Self-pay | Admitting: Obstetrics & Gynecology

## 2019-08-02 DIAGNOSIS — O365930 Maternal care for other known or suspected poor fetal growth, third trimester, not applicable or unspecified: Secondary | ICD-10-CM | POA: Diagnosis present

## 2019-08-02 DIAGNOSIS — O0990 Supervision of high risk pregnancy, unspecified, unspecified trimester: Secondary | ICD-10-CM

## 2019-08-02 DIAGNOSIS — O1404 Mild to moderate pre-eclampsia, complicating childbirth: Principal | ICD-10-CM | POA: Diagnosis present

## 2019-08-02 DIAGNOSIS — Z20822 Contact with and (suspected) exposure to covid-19: Secondary | ICD-10-CM | POA: Diagnosis present

## 2019-08-02 DIAGNOSIS — Z3A37 37 weeks gestation of pregnancy: Secondary | ICD-10-CM | POA: Diagnosis not present

## 2019-08-02 DIAGNOSIS — Z8759 Personal history of other complications of pregnancy, childbirth and the puerperium: Secondary | ICD-10-CM | POA: Diagnosis present

## 2019-08-02 DIAGNOSIS — O114 Pre-existing hypertension with pre-eclampsia, complicating childbirth: Secondary | ICD-10-CM | POA: Diagnosis not present

## 2019-08-02 LAB — GC/CHLAMYDIA PROBE AMP (~~LOC~~) NOT AT ARMC
Chlamydia: NEGATIVE
Comment: NEGATIVE
Comment: NORMAL
Neisseria Gonorrhea: NEGATIVE

## 2019-08-02 LAB — CBC
HCT: 37.3 % (ref 36.0–46.0)
HCT: 37.9 % (ref 36.0–46.0)
Hemoglobin: 12.2 g/dL (ref 12.0–15.0)
Hemoglobin: 12.6 g/dL (ref 12.0–15.0)
MCH: 30.6 pg (ref 26.0–34.0)
MCH: 30.7 pg (ref 26.0–34.0)
MCHC: 32.7 g/dL (ref 30.0–36.0)
MCHC: 33.2 g/dL (ref 30.0–36.0)
MCV: 93.5 fL (ref 80.0–100.0)
MCV: 92.4 fL (ref 80.0–100.0)
Platelets: 274 10*3/uL (ref 150–400)
Platelets: 215 K/uL (ref 150–400)
RBC: 3.99 MIL/uL (ref 3.87–5.11)
RBC: 4.1 MIL/uL (ref 3.87–5.11)
RDW: 13.2 % (ref 11.5–15.5)
RDW: 13.2 % (ref 11.5–15.5)
WBC: 8 10*3/uL (ref 4.0–10.5)
WBC: 13.4 K/uL — ABNORMAL HIGH (ref 4.0–10.5)
nRBC: 0 % (ref 0.0–0.2)
nRBC: 0 % (ref 0.0–0.2)

## 2019-08-02 LAB — COMPREHENSIVE METABOLIC PANEL
ALT: 16 U/L (ref 0–44)
AST: 15 U/L (ref 15–41)
Albumin: 2.5 g/dL — ABNORMAL LOW (ref 3.5–5.0)
Alkaline Phosphatase: 169 U/L — ABNORMAL HIGH (ref 38–126)
Anion gap: 8 (ref 5–15)
BUN: 9 mg/dL (ref 6–20)
CO2: 18 mmol/L — ABNORMAL LOW (ref 22–32)
Calcium: 8.7 mg/dL — ABNORMAL LOW (ref 8.9–10.3)
Chloride: 109 mmol/L (ref 98–111)
Creatinine, Ser: 0.54 mg/dL (ref 0.44–1.00)
GFR calc Af Amer: >60 mL/min (ref >60)
GFR calc non Af Amer: >60 mL/min (ref >60)
Glucose, Bld: 113 mg/dL — ABNORMAL HIGH (ref 70–99)
Potassium: 3.7 mmol/L (ref 3.5–5.1)
Sodium: 135 mmol/L (ref 135–145)
Total Bilirubin: 0.3 mg/dL (ref 0.3–1.2)
Total Protein: 6.2 g/dL — ABNORMAL LOW (ref 6.5–8.1)

## 2019-08-02 LAB — TYPE AND SCREEN
ABO/RH(D): B POS
Antibody Screen: NEGATIVE

## 2019-08-02 LAB — SARS CORONAVIRUS 2 BY RT PCR (HOSPITAL ORDER, PERFORMED IN ~~LOC~~ HOSPITAL LAB): SARS Coronavirus 2: NEGATIVE

## 2019-08-02 LAB — ABO/RH: ABO/RH(D): B POS

## 2019-08-02 LAB — RPR: RPR Ser Ql: NONREACTIVE

## 2019-08-02 MED ORDER — LIDOCAINE HCL (PF) 1 % IJ SOLN
INTRAMUSCULAR | Status: DC | PRN
  Administered 2019-08-02: 3 mL via EPIDURAL
  Administered 2019-08-02: 7 mL via EPIDURAL

## 2019-08-02 MED ORDER — LIDOCAINE HCL (PF) 1 % IJ SOLN
30.0000 mL | INTRAMUSCULAR | Status: AC | PRN
  Administered 2019-08-02: 30 mL via SUBCUTANEOUS

## 2019-08-02 MED ORDER — HYDRALAZINE HCL 20 MG/ML IJ SOLN: 5.0000 mg | INTRAMUSCULAR | Status: DC | PRN

## 2019-08-02 MED ORDER — SODIUM CHLORIDE (PF) 0.9 % IJ SOLN
INTRAMUSCULAR | Status: DC | PRN
  Administered 2019-08-02: 12 mL/h via EPIDURAL

## 2019-08-02 MED ORDER — FENTANYL CITRATE (PF) 100 MCG/2ML IJ SOLN
50.0000 ug | INTRAMUSCULAR | Status: DC | PRN
  Administered 2019-08-02 (×2): 100 ug via INTRAVENOUS
  Filled 2019-08-02 (×2): qty 2

## 2019-08-02 MED ORDER — SOD CITRATE-CITRIC ACID 500-334 MG/5ML PO SOLN: 30.0000 mL | ORAL | Status: DC | PRN

## 2019-08-02 MED ORDER — DIPHENHYDRAMINE HCL 50 MG/ML IJ SOLN: 12.5000 mg | INTRAMUSCULAR | Status: DC | PRN

## 2019-08-02 MED ORDER — LACTATED RINGERS IV SOLN: INTRAVENOUS | Status: DC

## 2019-08-02 MED ORDER — ONDANSETRON HCL 4 MG/2ML IJ SOLN: 4.0000 mg | Freq: Four times a day (QID) | INTRAMUSCULAR | Status: DC | PRN

## 2019-08-02 MED ORDER — OXYTOCIN BOLUS FROM INFUSION
333.0000 mL | Freq: Once | INTRAVENOUS | Status: AC
  Administered 2019-08-02: 333 mL via INTRAVENOUS

## 2019-08-02 MED ORDER — ACETAMINOPHEN 325 MG PO TABS: 650.0000 mg | ORAL_TABLET | ORAL | Status: DC | PRN

## 2019-08-02 MED ORDER — OXYTOCIN-SODIUM CHLORIDE 30-0.9 UT/500ML-% IV SOLN: 1.0000 m[IU]/min | INTRAVENOUS | Status: DC

## 2019-08-02 MED ORDER — TERBUTALINE SULFATE 1 MG/ML IJ SOLN: 0.2500 mg | Freq: Once | INTRAMUSCULAR | Status: DC | PRN

## 2019-08-02 MED ORDER — FLEET ENEMA 7-19 GM/118ML RE ENEM: 1.0000 | ENEMA | Freq: Every day | RECTAL | Status: DC | PRN

## 2019-08-02 MED ORDER — MISOPROSTOL 25 MCG QUARTER TABLET
25.0000 ug | ORAL_TABLET | ORAL | Status: DC | PRN
  Administered 2019-08-02 (×2): 25 ug via VAGINAL
  Filled 2019-08-02 (×4): qty 1

## 2019-08-02 MED ORDER — LIDOCAINE HCL (PF) 1 % IJ SOLN
INTRAMUSCULAR | Status: AC
  Filled 2019-08-02: qty 30

## 2019-08-02 MED ORDER — OXYCODONE-ACETAMINOPHEN 5-325 MG PO TABS: 1.0000 | ORAL_TABLET | ORAL | Status: DC | PRN

## 2019-08-02 MED ORDER — LABETALOL HCL 5 MG/ML IV SOLN: 40.0000 mg | INTRAVENOUS | Status: DC | PRN

## 2019-08-02 MED ORDER — PHENYLEPHRINE 40 MCG/ML (10ML) SYRINGE FOR IV PUSH (FOR BLOOD PRESSURE SUPPORT)
80.0000 ug | PREFILLED_SYRINGE | INTRAVENOUS | Status: DC | PRN
  Administered 2019-08-02: 80 ug via INTRAVENOUS

## 2019-08-02 MED ORDER — ZOLPIDEM TARTRATE 5 MG PO TABS: 5.0000 mg | ORAL_TABLET | Freq: Every evening | ORAL | Status: DC | PRN

## 2019-08-02 MED ORDER — LACTATED RINGERS IV SOLN
500.0000 mL | Freq: Once | INTRAVENOUS | Status: AC
  Administered 2019-08-02: 500 mL via INTRAVENOUS

## 2019-08-02 MED ORDER — LABETALOL HCL 5 MG/ML IV SOLN: 20.0000 mg | INTRAVENOUS | Status: DC | PRN

## 2019-08-02 MED ORDER — HYDROXYZINE HCL 50 MG PO TABS: 50.0000 mg | ORAL_TABLET | Freq: Four times a day (QID) | ORAL | Status: DC | PRN

## 2019-08-02 MED ORDER — EPHEDRINE 5 MG/ML INJ: 10.0000 mg | INTRAVENOUS | Status: DC | PRN

## 2019-08-02 MED ORDER — OXYTOCIN-SODIUM CHLORIDE 30-0.9 UT/500ML-% IV SOLN
2.5000 [IU]/h | INTRAVENOUS | Status: DC
  Administered 2019-08-02: 2.5 [IU]/h via INTRAVENOUS
  Filled 2019-08-02: qty 500

## 2019-08-02 MED ORDER — HYDRALAZINE HCL 20 MG/ML IJ SOLN: 10.0000 mg | INTRAMUSCULAR | Status: DC | PRN

## 2019-08-02 MED ORDER — FENTANYL-BUPIVACAINE-NACL 0.5-0.125-0.9 MG/250ML-% EP SOLN
12.0000 mL/h | EPIDURAL | Status: DC | PRN
  Filled 2019-08-02: qty 250

## 2019-08-02 MED ORDER — PHENYLEPHRINE 40 MCG/ML (10ML) SYRINGE FOR IV PUSH (FOR BLOOD PRESSURE SUPPORT)
80.0000 ug | PREFILLED_SYRINGE | INTRAVENOUS | Status: DC | PRN
  Filled 2019-08-02: qty 10

## 2019-08-02 MED ORDER — OXYCODONE-ACETAMINOPHEN 5-325 MG PO TABS: 2.0000 | ORAL_TABLET | ORAL | Status: DC | PRN

## 2019-08-02 MED ORDER — LACTATED RINGERS IV SOLN: 500.0000 mL | INTRAVENOUS | Status: DC | PRN

## 2019-08-02 MED ORDER — MISOPROSTOL 50MCG HALF TABLET
50.0000 ug | ORAL_TABLET | ORAL | Status: DC
  Administered 2019-08-02: 50 ug via ORAL

## 2019-08-02 NOTE — Anesthesia Procedure Notes (Signed)
Epidural Patient location during procedure: OB Start time: 08/02/2019 8:24 PM End time: 08/02/2019 8:35 PM  Staffing Anesthesiologist: Lucretia Kern, MD Performed: anesthesiologist   Preanesthetic Checklist Completed: patient identified, IV checked, risks and benefits discussed, monitors and equipment checked, pre-op evaluation and timeout performed  Epidural Patient position: sitting Prep: DuraPrep Patient monitoring: heart rate, continuous pulse ox and blood pressure Approach: midline Location: L3-L4 Injection technique: LOR air  Needle:  Needle type: Tuohy  Needle gauge: 17 G Needle length: 9 cm Needle insertion depth: 5 cm Catheter type: closed end flexible Catheter size: 19 Gauge Catheter at skin depth: 10 cm Test dose: negative  Assessment Events: blood not aspirated, injection not painful, no injection resistance, no paresthesia and negative IV test  Additional Notes Reason for block:procedure for pain

## 2019-08-02 NOTE — H&P (Addendum)
Christie Parker is a 24 y.o. female G1P0 with IUP at [redacted]w[redacted]d presenting for IOL for FGR and pre-eclampsia without severe features. Pt states she has been having no  contractions, associated with none vaginal bleeding for no hours..  Membranes are intact, with active fetal movement.  She denies HA, blurry vision, RUQ pain, floating spots in vision and overall swelling.  P[facility]are at [facility] at 28 weeks then transfer to  [facility] since 35 weeks due to [facility]. Patient was also found to have pre-eclampsia diagnosed on 07/23/2019.   Prenatal History/Complications:  Past Medical History: Past Medical History:  Diagnosis Date  . Gestational hypertension     Past Surgical History: Past Surgical History:  Procedure Laterality Date  . NO PAST SURGERIES      Obstetrical History: OB History    Gravida  1   Para      Term      Preterm      AB      Living  0     SAB      TAB      Ectopic      Multiple      Live Births  0            Social History: Social History   Socioeconomic History  . Marital status: Married    Spouse name: Not on file  . Number of children: Not on file  . Years of education: Not on file  . Highest education level: Not on file  Occupational History  . Not on file  Tobacco Use  . Smoking status: Never Smoker  . Smokeless tobacco: Never Used  Vaping Use  . Vaping Use: Never used  Substance and Sexual Activity  . Alcohol use: Never  . Drug use: Never  . Sexual activity: Yes  Other Topics Concern  . Not on file  Social History Narrative  . Not on file   Social Determinants of Health   Financial Resource Strain:   . Difficulty of Paying Living Expenses:   Food Insecurity: No Food Insecurity  . Worried About Charity fundraiser in the Last Year: Never true  . Ran Out of Food in the Last Year: Never true  Transportation Needs: No Transportation Needs  . Lack of Transportation (Medical): No  . Lack of Transportation (Non-Medical): No  Physical Activity:    . Days of Exercise per Week:   . Minutes of Exercise per Session:   Stress:   . Feeling of Stress :   Social Connections:   . Frequency of Communication with Friends and Family:   . Frequency of Social Gatherings with Friends and Family:   . Attends Religious Services:   . Active Member of Clubs or Organizations:   . Attends Archivist Meetings:   Marland Kitchen Marital Status:     Family History: History reviewed. No pertinent family history.  Allergies: No Known Allergies  Medications Prior to Admission  Medication Sig Dispense Refill Last Dose  . Prenatal Vit-Fe Fumarate-FA (PRENATAL VITAMIN PO) Take by mouth.           Review of Systems   Constitutional: Negative for fever and chills Eyes: Negative for visual disturbances Respiratory: Negative for shortness of breath, dyspnea Cardiovascular: Negative for chest pain or palpitations  Gastrointestinal: Negative for vomiting, diarrhea and constipation.  POSITIVE for abdominal pain (contractions) Genitourinary: Negative for dysuria and urgency Musculoskeletal: Negative for back pain, joint pain, myalgias  Neurological: Negative for dizziness and headaches  Blood pressure (!) 159/96, pulse (!) 102, temperature 98.4 F (36.9 C), temperature source Oral, height 5\' 2"  (1.575 m), weight 73.8 kg, last menstrual period 11/03/2018. General appearance: alert, cooperative and no distress Lungs: normal respiratory effort Heart: regular rate and rhythm Abdomen: soft, non-tender; bowel sounds normal Extremities: Homans sign is negative, no sign of DVT DTR's 2+ Presentation: cephalic per RN exam Fetal monitoring  Baseline: 140 bpm; mod var, present acel, no decels, no contractions Uterine activity  None Dilation: Closed Effacement (%): 50 Station: -2 Exam by:: J.Cox, RN   Prenatal labs: ABO, Rh: B/Positive/-- (04/19 0000) Antibody: Negative (04/19 0000) Rubella: Immune (04/19 0000) RPR: Nonreactive (04/19 0000)   HBsAg: Negative (04/19 0000)  HIV: Non-reactive (04/19 0000)  GBS: Negative/-- (06/25 0926)  1 hr Glucola  1 hour Genetic screening  Not done Anatomy 10-03-1987 normal with FGR  Prenatal Transfer Tool  Maternal Diabetes: No Genetic Screening: Declined Maternal Ultrasounds/Referrals: IUGR Fetal Ultrasounds or other Referrals:  Referred to Materal Fetal Medicine  Maternal Substance Abuse:  No Significant Maternal Medications:  None Significant Maternal Lab Results: Group B Strep negative     No results found for this or any previous visit (from the past 24 hour(s)).  Assessment: Christie Parker is a 24 y.o. G1P0 with an IUP at [redacted]w[redacted]d presenting for new diagnosis of pre-e without severe features and FGR of 2.4% and abnormal dopplers. CBC, CMP from admission are stable; recent PCR was 0.32.  Will start with cytotec vaginally; to be placed by RN.  Plan: #Labor: expectant management #Pain:  Per request #FWB Cat 1 #ID: GBS: negative  #MOF:  breast #MOC: declined #Circ: NA   [redacted]w[redacted]d Christie Parker 08/02/2019, 8:58 AM

## 2019-08-02 NOTE — Progress Notes (Signed)
   Christie Parker is a 24 y.o. G1P0 at [redacted]w[redacted]d  admitted for pre-e without severe features and FGR.   Subjective: Patient resting in bed; doesn't feel contractions. No blurry vision, HA, floating spots in vision, RUQ pain.   Objective: Vitals:   08/02/19 0956 08/02/19 1059 08/02/19 1156 08/02/19 1258  BP: (!) 135/95 (!) 138/102 (!) 145/89 (!) 133/97  Pulse: 72 84 85 85  Temp:      TempSrc:      Weight:      Height:       No intake/output data recorded.  FHT:  FHR: 145 bpm, variability: moderate,  accelerations:  Present,  decelerations:  Absent UC:   regular, every 2-3 minutes SVE:   Dilation: Closed Effacement (%): 50 Station: -2 Exam by:: J.Cox, RN  Labs: Lab Results  Component Value Date   WBC 8.0 08/02/2019   HGB 12.2 08/02/2019   HCT 37.3 08/02/2019   MCV 93.5 08/02/2019   PLT 274 08/02/2019    Assessment / Plan: early labor, s/p two doses of cytotec. Per RN, cervix is not favorable for foley balloon yet.  Will try foley balloon this afternoon.  Labor: early labor Fetal Wellbeing:  Category I Pain Control:  Labor support without medications Anticipated MOD:  NSVD  Marylene Land 08/02/2019, 1:17 PM

## 2019-08-02 NOTE — Progress Notes (Addendum)
   Christie Parker is a 24 y.o. G1P0 at [redacted]w[redacted]d  admitted for pre-e without severe features; FBR with 2.4 %. (4 lbs 1 oz). She denies HA, blurry vision, floating spots.   Subjective: Feeling ok, no complaints. Husband at the bedside.   Objective: Vitals:   08/02/19 1258 08/02/19 1402 08/02/19 1519 08/02/19 1607  BP: (!) 133/97 (!) 137/91 (!) 152/98 (!) 137/91  Pulse: 85 82 90 80  Temp:      TempSrc:      Weight:      Height:       No intake/output data recorded.  FHT:  FHR: 135 bpm, variability: moderate,  accelerations:  Present,  decelerations:  Present occasional variables UC:  irregular SVE:   Dilation: 1 Effacement (%): Thick Station: -2 Exam by:: Santina Evans, CNM   Labs: Lab Results  Component Value Date   WBC 8.0 08/02/2019   HGB 12.2 08/02/2019   HCT 37.3 08/02/2019   MCV 93.5 08/02/2019   PLT 274 08/02/2019    Assessment / Plan: early labor; Cooks catheter placed with 60 in vaginal and 60 in uterine balloon.  -No severe range pressures  -No signs and symptoms of pre-e; has not required any treatment. -continue cytotec q 4 hours PRN  Labor: Early labor Fetal Wellbeing:  Category I Pain Control:  Labor support without medications Anticipated MOD:  NSVD  Christie Parker 08/02/2019, 6:09 PM

## 2019-08-02 NOTE — Anesthesia Preprocedure Evaluation (Signed)
Anesthesia Evaluation  Patient identified by MRN, date of birth, ID band Patient awake    Reviewed: Allergy & Precautions, H&P , NPO status , Patient's Chart, lab work & pertinent test results  History of Anesthesia Complications Negative for: history of anesthetic complications  Airway Mallampati: II  TM Distance: >3 FB Neck ROM: full    Dental no notable dental hx.    Pulmonary neg pulmonary ROS,    Pulmonary exam normal        Cardiovascular hypertension (preeclampsia), Normal cardiovascular exam Rhythm:regular Rate:Normal     Neuro/Psych negative neurological ROS  negative psych ROS   GI/Hepatic negative GI ROS, Neg liver ROS,   Endo/Other  negative endocrine ROS  Renal/GU negative Renal ROS  negative genitourinary   Musculoskeletal   Abdominal   Peds  Hematology negative hematology ROS (+)   Anesthesia Other Findings   Reproductive/Obstetrics (+) Pregnancy                             Anesthesia Physical Anesthesia Plan  ASA: III  Anesthesia Plan: Epidural   Post-op Pain Management:    Induction:   PONV Risk Score and Plan:   Airway Management Planned:   Additional Equipment:   Intra-op Plan:   Post-operative Plan:   Informed Consent: I have reviewed the patients History and Physical, chart, labs and discussed the procedure including the risks, benefits and alternatives for the proposed anesthesia with the patient or authorized representative who has indicated his/her understanding and acceptance.       Plan Discussed with:   Anesthesia Plan Comments:         Anesthesia Quick Evaluation

## 2019-08-02 NOTE — Plan of Care (Signed)
Pt speaks some English offered her interpreter declines wants to use husband. According to notes been using husband during prenatal visits bc of difficulty getting Urdu on interpreter line.

## 2019-08-03 ENCOUNTER — Encounter (HOSPITAL_COMMUNITY): Payer: Self-pay | Admitting: Obstetrics & Gynecology

## 2019-08-03 ENCOUNTER — Encounter: Payer: Self-pay | Admitting: *Deleted

## 2019-08-03 MED ORDER — PRENATAL MULTIVITAMIN CH
1.0000 | ORAL_TABLET | Freq: Every day | ORAL | Status: DC
  Administered 2019-08-03: 1 via ORAL
  Filled 2019-08-03: qty 1

## 2019-08-03 MED ORDER — ACETAMINOPHEN 325 MG PO TABS: 650.0000 mg | ORAL_TABLET | Freq: Four times a day (QID) | ORAL | Status: DC | PRN

## 2019-08-03 MED ORDER — IBUPROFEN 600 MG PO TABS
600.0000 mg | ORAL_TABLET | Freq: Three times a day (TID) | ORAL | Status: DC | PRN
  Administered 2019-08-03 (×3): 600 mg via ORAL
  Filled 2019-08-03 (×3): qty 1

## 2019-08-03 MED ORDER — ONDANSETRON HCL 4 MG PO TABS: 4.0000 mg | ORAL_TABLET | ORAL | Status: DC | PRN

## 2019-08-03 MED ORDER — SIMETHICONE 80 MG PO CHEW: 80.0000 mg | CHEWABLE_TABLET | ORAL | Status: DC | PRN

## 2019-08-03 MED ORDER — ONDANSETRON HCL 4 MG/2ML IJ SOLN: 4.0000 mg | INTRAMUSCULAR | Status: DC | PRN

## 2019-08-03 MED ORDER — MEASLES, MUMPS & RUBELLA VAC IJ SOLR: 0.5000 mL | Freq: Once | INTRAMUSCULAR | Status: DC

## 2019-08-03 MED ORDER — SENNOSIDES-DOCUSATE SODIUM 8.6-50 MG PO TABS
2.0000 | ORAL_TABLET | ORAL | Status: DC
  Administered 2019-08-03: 2 via ORAL
  Filled 2019-08-03: qty 2

## 2019-08-03 MED ORDER — DIPHENHYDRAMINE HCL 25 MG PO CAPS: 25.0000 mg | ORAL_CAPSULE | Freq: Four times a day (QID) | ORAL | Status: DC | PRN

## 2019-08-03 MED ORDER — WITCH HAZEL-GLYCERIN EX PADS: 1.0000 "application " | MEDICATED_PAD | CUTANEOUS | Status: DC | PRN

## 2019-08-03 MED ORDER — TETANUS-DIPHTH-ACELL PERTUSSIS 5-2.5-18.5 LF-MCG/0.5 IM SUSP: 0.5000 mL | Freq: Once | INTRAMUSCULAR | Status: DC

## 2019-08-03 MED ORDER — BENZOCAINE-MENTHOL 20-0.5 % EX AERO
1.0000 "application " | INHALATION_SPRAY | CUTANEOUS | Status: DC | PRN
  Administered 2019-08-03: 1 via TOPICAL
  Filled 2019-08-03: qty 56

## 2019-08-03 MED ORDER — COCONUT OIL OIL: 1.0000 "application " | TOPICAL_OIL | Status: DC | PRN

## 2019-08-03 MED ORDER — DIBUCAINE (PERIANAL) 1 % EX OINT: 1.0000 "application " | TOPICAL_OINTMENT | CUTANEOUS | Status: DC | PRN

## 2019-08-03 MED ORDER — AMLODIPINE BESYLATE 5 MG PO TABS
5.0000 mg | ORAL_TABLET | Freq: Every day | ORAL | Status: DC
  Administered 2019-08-03 – 2019-08-04 (×2): 5 mg via ORAL
  Filled 2019-08-03 (×2): qty 1

## 2019-08-03 NOTE — Progress Notes (Addendum)
Post Partum Day 1  Subjective:  Christie Parker is a 24 y.o. G1P1001 89w0ds/p vaginal delivery.  No acute events overnight.  Pt denies problems with ambulating, voiding or po intake.  She denies nausea or vomiting.  Pain is well controlled.  She has had flatus. She has not had bowel movement.  Lochia Small.  Plan for birth control is no method.  Method of Feeding: breast.   Objective: BP (!) 141/90 (BP Location: Left Arm)    Pulse (!) 111    Temp 99 F (37.2 C) (Oral)    Resp 18    Ht 5' 2"  (1.575 m)    Wt 73.8 kg    LMP 11/03/2018 (Exact Date)    SpO2 100%    Breastfeeding Unknown    BMI 29.74 kg/m   Physical Exam:  General: alert, cooperative and no distress Lochia:normal flow Chest: CTAB Heart: RRR no m/r/g Abdomen: +BS, soft, nontender, fundus firm at/below umbilicus Uterine Fundus: firm DVT Evaluation: No evidence of DVT seen on physical exam. Extremities: no LE edema  Recent Labs    08/02/19 0830 08/02/19 2151  HGB 12.2 12.6  HCT 37.3 37.9    Assessment/Plan:  ASSESSMENT: Christie Parker is a 24y.o. G1P1001 38w0dpd #1 s/p NSVD doing well.   PreE: blood pressures remain elevated to [id number], start Norvasc 76m88mRoutine PP care  Plan for discharge tomorrow   LOS: 1 day   Christie Parker 08/03/2019, 8:02 AM   I personally saw and evaluated the patient, performing the key elements of the service. I developed and verified the management plan that is described in the resident's/student's note, and I agree with the content with my edits above. VSS, HRR&R, Resp unlabored, Legs neg.  FraNigel BertholdNM 08/05/2019 8:58 AM

## 2019-08-03 NOTE — Discharge Summary (Signed)
Postpartum Discharge Summary     Patient Name: Christie Parker DOB: 01/05/1996 MRN: [id number]  Date of admission: 08/02/2019 Delivery date:08/02/2019  Delivering provider: Christin Fudge  Date of discharge: 08/04/2019  Admitting diagnosis: Preeclampsia, third trimester [O14.93] Intrauterine pregnancy: [redacted]w[redacted]d    Secondary diagnosis:  Active Problems:   IUGR (intrauterine growth restriction) affecting care of mother   Preeclampsia, third trimester  Additional problems: None    Discharge diagnosis: Term Pregnancy Delivered and Preeclampsia (mild)                                              Post partum procedures:None Augmentation: Cytotec and IP Foley Complications: None  Hospital course: Induction of Labor With Vaginal Delivery   24y.o. yo G1P1001 at 368w0das admitted to the hospital 08/02/2019 for induction of labor.  Indication for induction: FGR, PreE.  Patient had an uncomplicated labor course as follows: Membrane Rupture Time/Date: 6:40 PM ,08/02/2019   Delivery Method:Vaginal, Spontaneous  Episiotomy: None  Lacerations:  Periurethral  Details of delivery can be found in separate delivery note. BP's remained elevated PP and patient was started on Norvasc 5 mg which was continued on discharge (not increased on day of discharge as patient had only received one dose and it takes a few days to be completely effective).  Patient had a routine postpartum course. Patient is discharged home 08/04/19.  Newborn Data: Birth date:08/02/2019  Birth time:11:09 PM  Gender:Female  Living status:Living  Apgars:8 ,9  Weight:2200 g   Magnesium Sulfate received: No BMZ received: No Rhophylac:N/A MMR:N/A T-DaP:Given prenatally Flu: N/A Transfusion:No  Physical exam  Vitals:   08/03/19 1418 08/03/19 1828 08/03/19 2146 08/04/19 0505  BP: (!) 134/99 (!) 132/91 (!) 139/100 (!) 125/91  Pulse: 88 (!) 106 96 87  Resp: _0 Temp: 99 F (37.2 C)  98.6 F (37 C) 98.9 F (37.2  C)  TempSrc: Oral  Oral Oral  SpO2:   100% 100%  Weight:      Height:       General: alert, cooperative and no distress Lochia: appropriate Uterine Fundus: firm Incision: N/A DVT Evaluation: No evidence of DVT seen on physical exam. Labs: Lab Results  Component Value Date   WBC 13.4 (H) 08/02/2019   HGB 12.6 08/02/2019   HCT 37.9 08/02/2019   MCV 92.4 08/02/2019   PLT 215 08/02/2019   CMP Latest Ref Rng & Units 08/02/2019  Glucose 70 - 99 mg/dL 113(H)  BUN 6 - 20 mg/dL 9  Creatinine 0.44 - 1.00 mg/dL 0.54  Sodium 135 - 145 mmol/L 135  Potassium 3.5 - 5.1 mmol/L 3.7  Chloride 98 - 111 mmol/L 109  CO2 22 - 32 mmol/L 18(L)  Calcium 8.9 - 10.3 mg/dL 8.7(L)  Total Protein 6.5 - 8.1 g/dL 6.2(L)  Total Bilirubin 0.3 - 1.2 mg/dL 0.3  Alkaline Phos 38 - 126 U/L 169(H)  AST 15 - 41 U/L 15  ALT 0 - 44 U/L 16   Edinburgh Score: Edinburgh Postnatal Depression Scale Screening Tool 08/03/2019  I have been able to laugh and see the funny side of things. (No Data)     After visit meds:  Allergies as of 08/04/2019   No Known Allergies     Medication List    TAKE these medications   acetaminophen 325 MG tablet Commonly  known as: Tylenol Take 2 tablets (650 mg total) by mouth every 6 (six) hours as needed (for pain scale < 4).   amLODipine 5 MG tablet Commonly known as: NORVASC Take 1 tablet (5 mg total) by mouth daily.   ibuprofen 600 MG tablet Commonly known as: ADVIL Take 1 tablet (600 mg total) by mouth every 8 (eight) hours as needed for mild pain.   PRENATAL VITAMIN PO Take 1 tablet by mouth daily.   senna-docusate 8.6-50 MG tablet Commonly known as: Senokot-S Take 2 tablets by mouth daily. Start taking on: August 05, 2019        Discharge home in stable condition Infant Feeding: Breast Infant Disposition:home with mother Discharge instruction: per After Visit Summary and Postpartum booklet. Activity: Advance as tolerated. Pelvic rest for 6 weeks.  Diet:  routine diet Future Appointments: Future Appointments  Date Time Provider [place]  09/22/2019 12:30 PM [facility] [place] [facility]   Follow up Visit:   Please schedule this patient for a Virtual postpartum visit in 4 weeks with the following provider: Any provider. Additional Postpartum F/U:BP check 1 week  High risk pregnancy complicated by: FGR, PreE Delivery mode:  Vaginal, Spontaneous  Anticipated Birth Control:  declines   08/04/2019 Chauncey Mann, MD

## 2019-08-03 NOTE — Lactation Note (Signed)
This note was copied from a baby's chart. Lactation Consultation Note  Patient Name: Christie Parker [id number] Date: 08/03/2019 Reason for consult: Initial assessment;Early term 37-38.6wks;Infant < 6lbs;Primapara;1st time breastfeeding  P1 mother whose infant is now 21 hours old.  This is an ETI at 37+0 weeks weighing < 5 lbs.  Father has signed the interpreter form but stated that mother does speak and understand [place].  Father present initially but then left the room when I started my visit.    Reviewed the LPTI policy with both parents.  Discussed lots of STS and asked parents to begin supplementation after every breast feeding session per the guidelines.  Mother's supplementation preference is Similac.  Baby will use Similac 22 due to weight.  Per RN, infant would not suck well on her finger and had some emesis.  SLP consult placed.  Discussed breast feeding basics with parents.  Reviewed feeding cues.  Mother has been taught hand expression.  Colostrum container provided and milk storage times reviewed.  Finger feeding demonstrated.    Encouraged to feed 8-12 times/24 hours or sooner if baby displays feeding cues.  She will awaken baby by the third hour if she does not self awaken.  Mother knows to call her RN if she has any difficulty with breast or bottle feeding and to attempt latching for no more than 5-10 minutes to conserve energy level.  Extra slow flow nipples provided for the supplementation.  Suggested mother begin pumping with the DEBP; reasons why this is beneficial provided.  Mother willing to pump.  While I was preparing the supplies father had to leave but reassured me that mother was fine with translation.  Pump parts, assembly, disassembly and cleaning discussed.  #24 flange size is appropriate at this time.  Mother will feed back any EBM she obtains to baby.  Coconut oil with instructions for use provided.  Mother does not have a DEBP for home use.  She does have private  insurance and I suggested she call the insurance company today to determine pump eligibility.  Mother verbalized understanding.  RN updated.   Maternal Data Formula Feeding for Exclusion: No Has patient been taught Hand Expression?: Yes Does the patient have breastfeeding experience prior to this delivery?: No  Feeding Feeding Type: Bottle Fed - Formula Nipple Type: Extra Slow Flow  LATCH Score                   Interventions    Lactation Tools Discussed/Used WIC Program: No Pump Review: Setup, frequency, and cleaning;Milk Storage Initiated by:: Tyreshia Ingman Date initiated:: 08/03/19   Consult Status Consult Status: Follow-up Date: 08/04/19 Follow-up type: In-patient    Dora Sims 08/03/2019, 8:47 AM

## 2019-08-03 NOTE — Anesthesia Postprocedure Evaluation (Signed)
Anesthesia Post Note  Patient: Christie Parker  Procedure(s) Performed: AN AD HOC LABOR EPIDURAL     Patient location during evaluation: Mother Baby Anesthesia Type: Epidural Level of consciousness: awake and alert Pain management: pain level controlled Vital Signs Assessment: post-procedure vital signs reviewed and stable Respiratory status: spontaneous breathing, nonlabored ventilation and respiratory function stable Cardiovascular status: stable Postop Assessment: no headache, no backache and epidural receding Anesthetic complications: no   No complications documented.  Last Vitals:  Vitals:   08/03/19 0206 08/03/19 0600  BP: (!) 134/93 (!) 141/90  Pulse: (!) 122 (!) 111  Resp: 18 18  Temp: 37.5 C 37.2 C  SpO2: 100% 100%    Last Pain:  Vitals:   08/03/19 0600  TempSrc: Oral  PainSc: 2    Pain Goal:                   Christie Parker

## 2019-08-04 ENCOUNTER — Encounter: Payer: Medicaid Other | Admitting: Family Medicine

## 2019-08-04 LAB — SURGICAL PATHOLOGY

## 2019-08-04 MED ORDER — SENNOSIDES-DOCUSATE SODIUM 8.6-50 MG PO TABS: 2.0000 | ORAL_TABLET | ORAL | 0 refills | Status: DC

## 2019-08-04 MED ORDER — ACETAMINOPHEN 325 MG PO TABS: 650.0000 mg | ORAL_TABLET | Freq: Four times a day (QID) | ORAL | 0 refills | Status: DC | PRN

## 2019-08-04 MED ORDER — AMLODIPINE BESYLATE 5 MG PO TABS: 5.0000 mg | ORAL_TABLET | Freq: Every day | ORAL | 0 refills | Status: DC

## 2019-08-04 MED ORDER — IBUPROFEN 600 MG PO TABS: 600.0000 mg | ORAL_TABLET | Freq: Three times a day (TID) | ORAL | 0 refills | Status: DC | PRN

## 2019-08-04 MED FILL — AMLODIPINE BESYLATE 5 MG TA: 5 | 60 days supply | Qty: 60 | Fill #0

## 2019-08-04 MED FILL — SENEXON-S 8.6-50 MG TABS: 8.6-50 | 15 days supply | Qty: 30 | Fill #0

## 2019-08-04 MED FILL — ACETAMINOPHEN 325 MG TABS: 325 | 30 days supply | Qty: 30 | Fill #0

## 2019-08-04 MED FILL — IBUPROFEN 600 MG TABLET: 600 | 10 days supply | Qty: 30 | Fill #0

## 2019-08-04 NOTE — Discharge Instructions (Signed)
Hypertension During Pregnancy Hypertension is also called high blood pressure. High blood pressure means that the force of your blood moving in your body is too strong. It can cause problems for you and your baby. Different types of high blood pressure can happen during pregnancy. The types are:  High blood pressure before you got pregnant. This is called chronic hypertension.  This can continue during your pregnancy. Your doctor will want to keep checking your blood pressure. You may need medicine to keep your blood pressure under control while you are pregnant. You will need follow-up visits after you have your baby.  High blood pressure that goes up during pregnancy when it was normal before. This is called gestational hypertension. It will usually get better after you have your baby, but your doctor will need to watch your blood pressure to make sure that it is getting better.  Very high blood pressure during pregnancy. This is called preeclampsia. Very high blood pressure is an emergency that needs to be checked and treated right away.  You may develop very high blood pressure after giving birth. This is called postpartum preeclampsia. This usually occurs within 48 hours after childbirth but may occur up to 6 weeks after giving birth. This is rare. How does this affect me? If you have high blood pressure during pregnancy, you have a higher chance of developing high blood pressure:  As you get older.  If you get pregnant again. In some cases, high blood pressure during pregnancy can cause:  Stroke.  Heart attack.  Damage to the kidneys, lungs, or liver.  Preeclampsia.  Jerky movements you cannot control (convulsions or seizures).  Problems with the placenta. How does this affect my baby? Your baby may:  Be born early.  Not weigh as much as he or she should.  Not handle labor well, leading to a c-section birth. What are the risks?  Having high blood pressure during a past  pregnancy.  Being overweight.  Being 35 years old or older.  Being pregnant for the first time.  Being pregnant with more than one baby.  Becoming pregnant using fertility methods, such as IVF.  Having other problems, such as diabetes, or kidney disease.  Having family members who have high blood pressure. What can I do to lower my risk?   Keep a healthy weight.  Eat a healthy diet.  Follow what your doctor tells you about treating any medical problems that you had before becoming pregnant. It is very important to go to all of your doctor visits. Your doctor will check your blood pressure and make sure that your pregnancy is progressing as it should. Treatment should start early if a problem is found. How is this treated? Treatment for high blood pressure during pregnancy can differ depending on the type of high blood pressure you have and how serious it is.  You may need to take blood pressure medicine.  If you have been taking medicine for your blood pressure, you may need to change the medicine during pregnancy if it is not safe for your baby.  If your doctor thinks that you could get very high blood pressure, he or she may tell you to take a low-dose aspirin during your pregnancy.  If you have very high blood pressure, you may need to stay in the hospital so you and your baby can be watched closely. You may also need to take medicine to lower your blood pressure. This medicine may be given by mouth   or through an IV tube.  In some cases, if your condition gets worse, you may need to have your baby early. Follow these instructions at home: Eating and drinking   Drink enough fluid to keep your pee (urine) pale yellow.  Avoid caffeine. Lifestyle  Do not use any products that contain nicotine or tobacco, such as cigarettes, e-cigarettes, and chewing tobacco. If you need help quitting, ask your doctor.  Do not use alcohol or drugs.  Avoid stress.  Rest and get plenty  of sleep.  Regular exercise can help. Ask your doctor what kinds of exercise are best for you. General instructions  Take over-the-counter and prescription medicines only as told by your doctor.  Keep all prenatal and follow-up visits as told by your doctor. This is important. Contact a doctor if:  You have symptoms that your doctor told you to watch for, such as: ? Headaches. ? Nausea. ? Vomiting. ? Belly (abdominal) pain. ? Dizziness. ? Light-headedness. Get help right away if:  You have: ? Very bad belly pain that does not get better with treatment. ? A very bad headache that does not get better. ? Vomiting that does not get better. ? Sudden, fast weight gain. ? Sudden swelling in your hands, ankles, or face. ? Bleeding from your vagina. ? Blood in your pee. ? Blurry vision. ? Double vision. ? Shortness of breath. ? Chest pain. ? Weakness on one side of your body. ? Trouble talking.  Your baby is not moving as much as usual. Summary  High blood pressure is also called hypertension.  High blood pressure means that the force of your blood moving in your body is too strong.  High blood pressure can cause problems for you and your baby.  Keep all follow-up visits as told by your doctor. This is important. This information is not intended to replace advice given to you by your health care provider. Make sure you discuss any questions you have with your health care provider. Document Revised: 05/14/2018 Document Reviewed: 02/17/2018 Elsevier Patient Education  2020 Else[employer] Inc. Postpartum Care After Vaginal Delivery This sheet gives you information about how to care for yourself from the time you deliver your baby to up to 6-12 weeks after delivery (postpartum period). Your health care provider may also give you more specific instructions. If you have problems or questions, contact your health care provider. Follow these instructions at home: Vaginal bleeding  It is  normal to have vaginal bleeding (lochia) after delivery. Wear a sanitary pad for vaginal bleeding and discharge. ? During the first week after delivery, the amount and appearance of lochia is often similar to a menstrual period. ? Over the next few weeks, it will gradually decrease to a dry, yellow-brown discharge. ? For most women, lochia stops completely by 4-6 weeks after delivery. Vaginal bleeding can vary from woman to woman.  Change your sanitary pads frequently. Watch for any changes in your flow, such as: ? A sudden increase in volume. ? A change in color. ? Large blood clots.  If you pass a blood clot from your vagina, save it and call your health care provider to discuss. Do not flush blood clots down the toilet before talking with your health care provider.  Do not use tampons or douches until your health care provider says this is safe.  If you are not breastfeeding, your period should return 6-8 weeks after delivery. If you are feeding your child breast milk only (exclusive breastfeeding), your  period may not return until you stop breastfeeding. Perineal care  Keep the area between the vagina and the anus (perineum) clean and dry as told by your health care provider. Use medicated pads and pain-relieving sprays and creams as directed.  If you had a cut in the perineum (episiotomy) or a tear in the vagina, check the area for signs of infection until you are healed. Check for: ? More redness, swelling, or pain. ? Fluid or blood coming from the cut or tear. ? Warmth. ? Pus or a bad smell.  You may be given a squirt bottle to use instead of wiping to clean the perineum area after you go to the bathroom. As you start healing, you may use the squirt bottle before wiping yourself. Make sure to wipe gently.  To relieve pain caused by an episiotomy, a tear in the vagina, or swollen veins in the anus (hemorrhoids), try taking a warm sitz bath 2-3 times a day. A sitz bath is a warm water  bath that is taken while you are sitting down. The water should only come up to your hips and should cover your buttocks. Breast care  Within the first few days after delivery, your breasts may feel heavy, full, and uncomfortable (breast engorgement). Milk may also leak from your breasts. Your health care provider can suggest ways to help relieve the discomfort. Breast engorgement should go away within a few days.  If you are breastfeeding: ? Wear a bra that supports your breasts and fits you well. ? Keep your nipples clean and dry. Apply creams and ointments as told by your health care provider. ? You may need to use breast pads to absorb milk that leaks from your breasts. ? You may have uterine contractions every time you breastfeed for up to several weeks after delivery. Uterine contractions help your uterus return to its normal size. ? If you have any problems with breastfeeding, work with your health care provider or Advertising copywriter.  If you are not breastfeeding: ? Avoid touching your breasts a lot. Doing this can make your breasts produce more milk. ? Wear a good-fitting bra and use cold packs to help with swelling. ? Do not squeeze out (express) milk. This causes you to make more milk. Intimacy and sexuality  Ask your health care provider when you can engage in sexual activity. This may depend on: ? Your risk of infection. ? How fast you are healing. ? Your comfort and desire to engage in sexual activity.  You are able to get pregnant after delivery, even if you have not had your period. If desired, talk with your health care provider about methods of birth control (contraception). Medicines  Take over-the-counter and prescription medicines only as told by your health care provider.  If you were prescribed an antibiotic medicine, take it as told by your health care provider. Do not stop taking the antibiotic even if you start to feel better. Activity  Gradually return to  your normal activities as told by your health care provider. Ask your health care provider what activities are safe for you.  Rest as much as possible. Try to rest or take a nap while your baby is sleeping. Eating and drinking   Drink enough fluid to keep your urine pale yellow.  Eat high-fiber foods every day. These may help prevent or relieve constipation. High-fiber foods include: ? Whole grain cereals and breads. ? Brown rice. ? Beans. ? Fresh fruits and vegetables.  Do not  try to lose weight quickly by cutting back on calories.  Take your prenatal vitamins until your postpartum checkup or until your health care provider tells you it is okay to stop. Lifestyle  Do not use any products that contain nicotine or tobacco, such as cigarettes and e-cigarettes. If you need help quitting, ask your health care provider.  Do not drink alcohol, especially if you are breastfeeding. General instructions  Keep all follow-up visits for you and your baby as told by your health care provider. Most women visit their health care provider for a postpartum checkup within the first 3-6 weeks after delivery. Contact a health care provider if:  You feel unable to cope with the changes that your child brings to your life, and these feelings do not go away.  You feel unusually sad or worried.  Your breasts become red, painful, or hard.  You have a fever.  You have trouble holding urine or keeping urine from leaking.  You have little or no interest in activities you used to enjoy.  You have not breastfed at all and you have not had a menstrual period for 12 weeks after delivery.  You have stopped breastfeeding and you have not had a menstrual period for 12 weeks after you stopped breastfeeding.  You have questions about caring for yourself or your baby.  You pass a blood clot from your vagina. Get help right away if:  You have chest pain.  You have difficulty breathing.  You have sudden,  severe leg pain.  You have severe pain or cramping in your lower abdomen.  You bleed from your vagina so much that you fill more than one sanitary pad in one hour. Bleeding should not be heavier than your heaviest period.  You develop a severe headache.  You faint.  You have blurred vision or spots in your vision.  You have bad-smelling vaginal discharge.  You have thoughts about hurting yourself or your baby. If you ever feel like you may hurt yourself or others, or have thoughts about taking your own life, get help right away. You can go to the nearest emergency department or call:  Your local emergency services (911 in the [place]  A suicide crisis helpline, such as the National Suicide Prevention Lifeline at 1-800-273-8255. This is open 24 hours a day. Summary  The period of time right after you deliver your newborn up to 6-12 weeks after delivery is called the postpartum period.  Gradually return to your normal activities as told by your health care provider.  Keep all follow-up visits for you and your baby as told by your health care provider. This information is not intended to replace advice given to you by your health care provider. Make sure you discuss any questions you have with your health care provider. Document Revised: 01/24/2017 Document Reviewed: 11/04/2016 Elsevier Patient Education  2020 Elsevier Inc.  

## 2019-08-04 NOTE — Lactation Note (Signed)
This note was copied from a baby's chart. Lactation Consultation Note  Patient Name: Christie Parker NUUVO'Z Date: 08/04/2019 Reason for consult: Follow-up assessment   LC arrived in mothers and mother was pumping. Mother pumped 10 ml. Infant became fussy and mother placed infant to the breast in football hold.  Infant suckled on and off with a few swallows observed. Infant was observed with non-nutritive sucking for 10 mins. .  Mother reports that she is active with WIC . Mother was given a hand pump with instructions.  WIC referral was faxed to [facility]. Mother informed that [facility] representative will reach out and call her to inform her of getting a pump for home use.   Infant maybe transferred to NICU if feeding are not  Improving.   Mother advised to pump every 2-3 hours for 15-20 mins. Mother understands she will be able to use pump in the NICU if infant goes to NICU.  Discussed treatment and prevention of engorgement.  Mother receptive to all teaching.     Maternal Data Has patient been taught Hand Expression?: Yes  Feeding Feeding Type: Breast Fed (infant began to fuss and mother placed infant to the breast) Nipple Type: Nfant Extra Slow Flow (gold)  LATCH Score Latch: Grasps breast easily, tongue down, lips flanged, rhythmical sucking.  Audible Swallowing: A few with stimulation  Type of Nipple: Everted at rest and after stimulation  Comfort (Breast/Nipple): Soft / non-tender  Hold (Positioning): Assistance needed to correctly position infant at breast and maintain latch.  LATCH Score: 8  Interventions    Lactation Tools Discussed/Used WIC Program: Yes Pump Review: Setup, frequency, and cleaning;Milk Storage Initiated by:: Allisson Schindel rn, ibclc Date initiated:: 08/04/19   Consult Status Consult Status: Follow-up Follow-up type: In-patient    Stevan Born [facility] 08/04/2019, 11:41 AM

## 2019-08-04 NOTE — Lactation Note (Signed)
This note was copied from a baby's chart. Lactation Consultation Note Baby 24 hrs old. Wt. 4.13lbs. being supplemented w/formula. Discussed w/parents (FOB interpreting for mom). Mom states she is putting baby to the breast first then giving formula afterwards. Discussed the importance of strict I&O w/such a small baby. Need to know how long the baby is BF and the baby needs to take more formula. Let baby rest then try again before hr is up on formula to get the volume needed. Report to the RN or tech how long BF is. Not much documentation on BF.  Asked parents to call for [facility] for next BF so LC can see how the baby is feeding at the breast.   Patient Name: Christie Parker HYIFO'Y Date: 08/04/2019 Reason for consult: Follow-up assessment;Early term 37-38.6wks;Infant < 6lbs;Primapara   Maternal Data    Feeding Feeding Type: Formula Nipple Type: Extra Slow Flow  LATCH Score                   Interventions    Lactation Tools Discussed/Used     Consult Status Consult Status: Follow-up Date: 08/04/19 Follow-up type: In-patient    Weslie Pretlow, Diamond Nickel 08/04/2019, 12:08 AM

## 2019-08-05 ENCOUNTER — Ambulatory Visit: Payer: Medicaid Other

## 2019-08-05 ENCOUNTER — Ambulatory Visit: Payer: Self-pay

## 2019-08-05 ENCOUNTER — Encounter: Payer: Medicaid Other | Admitting: Family Medicine

## 2019-08-05 NOTE — Lactation Note (Addendum)
This note was copied from a baby's chart. Lactation Consultation Note  Patient Name: Christie Parker GYFVC'B Date: 08/05/2019 Reason for consult: Follow-up assessment;1st time breastfeeding;Primapara;NICU baby;Infant < 6lbs;Early term 37-38.6wks  LC in to visit with P1 Mom of ET infant at 72 hrs old.  Baby at 5.5% weight loss, <5 lbs.  Baby was transferred to NICU due to poor feedings.  Currently baby has NG tube in place, but can go to breast with cues.    Mom in recliner with baby down by her lap.  Baby dressed and loosely swaddled and Mom wearing clothes.  Mom states she just breastfed baby for 7 minutes because she was acting hungry.  Placed pillow under baby and asked if Mom would like for baby to be STS.  Mom started undressing baby and pulled her gown up.  Chair reclined and baby placed prone on Mom's chest.  Baby relaxed and sleeping.   LC washed Mom's pump parts.  She had recently pumped and her EBM is in the refrigerator.  Breasts are heavier today, but compressible.  Made 2 bins for washing and drying of pump parts.  Mom was very Adult nurse.    Encouraged STS with baby and double pumping every 2-3 hrs during the day and 3-4 hrs at night.  Mom plans to spend the night tonight in baby's room.  Call bell placed in chair for Mom to call prn for assistance.  Mom to ask for help with latching as needed.  Mom states WIC called about picking up the pump, but she hasn't yet.  Mom told that [facility] is open M-F.  Mom used a hand pump last night.    Interventions Interventions: Breast feeding basics reviewed;Skin to skin;Breast massage;Hand express;DEBP  Lactation Tools Discussed/Used Tools: Pump Breast pump type: Double-Electric Breast Pump   Consult Status Consult Status: Follow-up Date: 08/06/19 Follow-up type: In-patient    Judee Clara 08/05/2019, 2:18 PM

## 2019-08-06 ENCOUNTER — Ambulatory Visit: Payer: Self-pay

## 2019-08-06 NOTE — Lactation Note (Signed)
This note was copied from a baby's chart. Lactation Consultation Note  Patient Name: [name] SEGBT'D Date: 08/06/2019 Reason for consult: Follow-up assessment;Primapara;1st time breastfeeding;NICU baby;Other (Comment) (RN request)  [phone] - Tablet interpretor: Afshan #[id number].  I conducted a follow up feeding assessment with Christie Parker and her 32 hour old early term daughter, Christie Parker. Baby was due for a feeding. RN reports poor feedings at the breast and difficulty with bottle feeding. Baby takes a pacifier with intermittent suckling sequences.  We placed baby in cross cradle hold on the right breast. I directed Christie Parker to hand express her milk. Baby opens wide to hold nipple in mouth. I noted one or two suckles, but baby primarily held nipple in her mouth. After a few moments she began to cry.  I placed a nipple shield on the breast and dotted it with EBM. Baby again held shield in mouth and then became frustrated. We placed her STS and discontinued attempt.  I recommended continuing with offering the breast at each feeding and STS while baby was being gavage fed to associate th breast with feeding. Baby did not exhibit suckling reflex with nipple sandwiched into her mouth. Christie Parker has everted, pliable nipples.  I reviewed the pumping routine. Christie Parker is rooming in and pumping 8 times a day. She is now obtaining upwards of 90-120 mls of breast milk with pumping sessions. I praised her for doing such a good job and encouraged her to continue with this plan.  I offered to return for follow up tomorrow, and she agreed.  Maternal Data Has patient been taught Hand Expression?: Yes Does the patient have breastfeeding experience prior to this delivery?: No  LATCH Score Latch: Repeated attempts needed to sustain latch, nipple held in mouth throughout feeding, stimulation needed to elicit sucking reflex.  Audible Swallowing: None  Type of Nipple: Everted at rest and after  stimulation  Comfort (Breast/Nipple): Soft / non-tender  Hold (Positioning): Assistance needed to correctly position infant at breast and maintain latch.  LATCH Score: 6  Interventions Interventions: Breast feeding basics reviewed;Assisted with latch;Skin to skin;Hand express;Adjust position;Support pillows  Lactation Tools Discussed/Used Pump Review: Setup, frequency, and cleaning;Milk Storage   Consult Status Consult Status: Follow-up Date: 08/07/19 Follow-up type: In-patient    Walker Shadow 08/06/2019, 3:06 PM

## 2019-08-09 ENCOUNTER — Inpatient Hospital Stay (HOSPITAL_COMMUNITY)
Admission: AD | Admit: 2019-08-09 | Discharge: 2019-08-09 | Disposition: A | Discharge status: Home / Self Care | Payer: Medicaid Other | Attending: Obstetrics & Gynecology | Admitting: Obstetrics & Gynecology

## 2019-08-09 ENCOUNTER — Other Ambulatory Visit: Payer: Self-pay

## 2019-08-09 DIAGNOSIS — O1415 Severe pre-eclampsia, complicating the puerperium: Secondary | ICD-10-CM | POA: Diagnosis not present

## 2019-08-09 DIAGNOSIS — O1495 Unspecified pre-eclampsia, complicating the puerperium: Secondary | ICD-10-CM | POA: Diagnosis not present

## 2019-08-09 NOTE — MAU Note (Addendum)
Dr called, wanted her to come in (office is closed) and get BP checked, 1 wk pp. Vag del 6/28. dc'd on BP med.  States is feeling better. No HA, visual changes, epigastric pain or swelling. Baby is in NICU, doing ok.  No problems with elimination.

## 2019-08-09 NOTE — MAU Provider Note (Signed)
S: [id number] who is 7 days postpartum following a vaginal delivery presents to MAU for a blood pressure check due to holiday scheduling in the office.  She denies any h/a, epigastric pain, or visual disturbances.  Her baby is in NICU and doing well.    O: BP 134/84 (BP Location: Right Arm)   Pulse 90   Temp 98.1 F (36.7 C) (Oral)   Resp 16   SpO2 100%   Breastfeeding Yes    VS reviewed, nursing note reviewed,  Constitutional: well developed, well nourished, no distress HEENT: normocephalic CV: normal rate Pulm/chest wall: normal effort Abdomen: soft Neuro: alert and oriented x 3 Skin: warm, dry Psych: affect normal  A:  Preeclampsia S/P vaginal delivery   P: D/C home Continue Norvasc daily F/U at [facility] visit at 4-6 weeks with [facility] Return to MAU with any s/sx of PEC  Sharen Counter, CNM 6:22 PM

## 2019-08-11 ENCOUNTER — Encounter: Payer: Medicaid Other | Admitting: Obstetrics & Gynecology

## 2019-08-12 ENCOUNTER — Ambulatory Visit: Payer: Medicaid Other

## 2019-08-12 ENCOUNTER — Encounter: Payer: Medicaid Other | Admitting: Family Medicine

## 2019-08-16 ENCOUNTER — Encounter: Payer: Self-pay | Admitting: General Practice

## 2019-08-16 ENCOUNTER — Ambulatory Visit: Payer: Self-pay

## 2019-08-16 NOTE — Lactation Note (Signed)
This note was copied from a baby's chart. Lactation Consultation Note  Patient Name: Christie Parker OYDXA'[id number] Date: 08/16/2019 Reason for consult: Follow-up assessment;NICU baby;Early term 37-38.6wks  1450 - I followed up with Christie Parker. RN informed lactation that baby will be discharged this afternoon. I bought the tablet into the room, and she declined use of an interpretor. We discussed her feeding plans for discharge. She states that she has a [facility] symphony pump at home and she is breast feeding and pumping. I encouraged her to pump and supplement baby with feedings. She states that baby is latching well, however.  Christie Parker has not chosen a pediatrician.   I followed up with RN, [name], after the visit. After discussion, it seemed like patient would benefit from additional guidance regarding discharge feeding plan. I reviewed with charge [facility] nurse post visit.   Feeding Feeding Type: Bottle Fed - Breast Milk Nipple Type: Dr. Levert Feinstein Preemie  Interventions Interventions: Breast feeding basics reviewed     Consult Status Consult Status: Follow-up Date: 08/16/19 Follow-up type: In-patient    Walker Shadow 08/16/2019, 3:15 PM

## 2019-08-18 ENCOUNTER — Encounter: Payer: Medicaid Other | Admitting: Obstetrics and Gynecology

## 2019-08-25 ENCOUNTER — Encounter: Payer: Medicaid Other | Admitting: Obstetrics and Gynecology

## 2019-09-02 ENCOUNTER — Encounter: Payer: Self-pay | Admitting: Obstetrics & Gynecology

## 2019-09-02 ENCOUNTER — Encounter (INDEPENDENT_AMBULATORY_CARE_PROVIDER_SITE_OTHER): Payer: Medicaid Other | Admitting: Obstetrics and Gynecology

## 2019-09-02 NOTE — Progress Notes (Signed)
Chart reviewed for nurse visit. Agree with plan of care.   Duane Lope, NP 09/02/2019 3:26 PM

## 2019-09-14 ENCOUNTER — Ambulatory Visit (INDEPENDENT_AMBULATORY_CARE_PROVIDER_SITE_OTHER): Payer: Medicaid Other | Admitting: Nurse Practitioner

## 2019-09-14 ENCOUNTER — Other Ambulatory Visit: Payer: Self-pay

## 2019-09-14 ENCOUNTER — Encounter: Payer: Self-pay | Admitting: Nurse Practitioner

## 2019-09-14 DIAGNOSIS — Z30011 Encounter for initial prescription of contraceptive pills: Secondary | ICD-10-CM

## 2019-09-14 MED ORDER — NORETHINDRONE 0.35 MG PO TABS
1.0000 | ORAL_TABLET | Freq: Every day | ORAL | 11 refills | Status: DC
Start: 2019-09-14 — End: 2020-05-04

## 2019-09-14 NOTE — Progress Notes (Signed)
    Post Partum Visit Note  Christie Parker is a 24 y.o. G50P1001 female who presents for a postpartum visit. She is 6 weeks postpartum following a normal spontaneous vaginal delivery.  I have fully reviewed the prenatal and intrapartum course. The delivery was at 37 gestational weeks with complications of Preeclampsia and IUGR.  Anesthesia: epidural. Postpartum course has been good. Baby is doing well after several days in NICU due to IUGR and difficulty breathing.Christie Parker is feeding by breast. Bleeding staining only. Bowel function is normal. Bladder function is normal. Patient is not sexually active. Contraception method is none. Patient interested in OCPs. Postpartum depression screening: negative.   The pregnancy intention screening data noted above was reviewed. Potential methods of contraception were discussed. The patient elected to proceed with Progestin only  Oral Contraceptive.      The following portions of the patient's history were reviewed and updated as appropriate: allergies, current medications, past family history, past medical history, past social history, past surgical history and problem list.  Review of Systems Pertinent items noted in HPI and remainder of comprehensive ROS otherwise negative.    Objective:  currently breastfeeding.  General:  alert, cooperative and no distress   Breasts:  deferred - breastfeeding  Lungs: clear to auscultation bilaterally  Heart:  regular rate and rhythm, S1, S2 normal, no murmur, click, rub or gallop  Abdomen: deferred   Vulva:  Pelvic deferred  Vagina:   Cervix:    Corpus:   Adnexa:    Rectal Exam:         Assessment:    Normal postpartum exam. Pap smear not done at today's visit.   Plan:   Essential components of care per ACOG recommendations:  1.  Mood and well being: Patient with negative depression screening today. Reviewed local resources for support.  - Patient does not use tobacco. - hx of drug use? No    2. Infant  care and feeding:  -Patient currently breastmilk feeding? Yes Reviewed importance of draining breast regularly to support lactation. -Social determinants of health (SDOH) reviewed in EPIC. No concerns  3. Sexuality, contraception and birth spacing - Patient does not want a pregnancy in the next year.  Desired family size is unsure of number of children.  - Reviewed forms of contraception in tiered fashion. Patient desired oral progesterone-only contraceptive today.   - Discussed birth spacing of 18 months  4. Sleep and fatigue -Encouraged family/partner/community support of 4 hrs of uninterrupted sleep to help with mood and fatigue  5. Physical Recovery  - Discussed patients delivery and complications - Patient had a periurethral laceration, perineal healing reviewed. Patient expressed understanding - Patient has urinary incontinence? No  - Patient is safe to resume physical and sexual activity  6.  Health Maintenance - Last pap smear done 10-2018 and was normal with negative HPV.  7. Chronic Disease - will stop Norvasc (has been taking 5 mg daily) since BP is normal today and have client return in 2 weeks for BP check to see if she needs to restart medication.   Christie Pearson, RN Center for [employer], [facility]  Christie Bernheim, RN, MSN, NP-BC Nurse Practitioner, Biochemist, clinical for [employer], [facility] 09/14/2019 3:37 PM

## 2019-09-22 ENCOUNTER — Other Ambulatory Visit: Payer: Self-pay

## 2019-09-22 ENCOUNTER — Ambulatory Visit
Admission: RE | Admit: 2019-09-22 | Discharge: 2019-09-22 | Disposition: A | Discharge status: Home / Self Care | Payer: Medicaid Other | Source: Ambulatory Visit | Attending: Nurse Practitioner | Admitting: Nurse Practitioner

## 2019-09-22 DIAGNOSIS — N6022 Fibroadenosis of left breast: Secondary | ICD-10-CM | POA: Diagnosis not present

## 2019-09-22 DIAGNOSIS — N6310 Unspecified lump in the right breast, unspecified quadrant: Secondary | ICD-10-CM

## 2019-09-28 ENCOUNTER — Ambulatory Visit (INDEPENDENT_AMBULATORY_CARE_PROVIDER_SITE_OTHER): Payer: Medicaid Other | Admitting: Lactation Services

## 2019-09-28 ENCOUNTER — Other Ambulatory Visit: Payer: Self-pay

## 2019-09-28 ENCOUNTER — Encounter: Payer: Self-pay | Admitting: Lactation Services

## 2019-09-28 DIAGNOSIS — Z8759 Personal history of other complications of pregnancy, childbirth and the puerperium: Secondary | ICD-10-CM

## 2019-09-28 NOTE — Progress Notes (Signed)
I have reviewed and agree with the plan as outlined.   Casper Harrison, MD [facility] Family Medicine Fellow, [facility] for [facility], [facility]

## 2019-09-28 NOTE — Progress Notes (Addendum)
Patient denies need for interpreter.   Mom reports she is hungry. Infant is nursing 5-6 times a day and getting 1-2 60 ml bottles of formula. Mom has NICU pump at home. Reviewed calories intake while BF. Reviewed pumping a few times a day to promote milk supply. She voiced understanding.   Patient denies Headache, Blurred vision, or dizziness. BP WNL, patient is off meds.   Patient is wanting to start on birth control medication as she is concerned she may get pregnant. Patient reports she as not able to get the Micronor at [facility]. She is having unprotected sex, 2 weeks ago and last week. Patient has not had a period since infant birth. UPT today is negative.   Called [facility] and they report they have Norlyda and do not have Micronor in the pharmacy but can get tomorrow. Patient undecided which birth control she wants and has a lot of questions. Patient is to get the [place] today and then use Condoms for 2-3 weeks. Condoms given. Patient to speak with provider at next visit about Depo Provera. Patient voiced understanding.

## 2019-10-26 ENCOUNTER — Ambulatory Visit (INDEPENDENT_AMBULATORY_CARE_PROVIDER_SITE_OTHER): Payer: Medicaid Other | Admitting: Women's Health

## 2019-10-26 ENCOUNTER — Other Ambulatory Visit: Payer: Self-pay

## 2019-10-26 NOTE — Progress Notes (Signed)
Patient here for Pap. However, patient had NILM Pap 05/2019 at [facility] per scanned records and does not need one today. Patient was unsure why she was scheduled and left without being seen once told she did not need a Pap today. Appointment note stating patient wanted to discuss Depo, but patient says to RN she is satisfied with her POPs and does not wish to switch to Depo at this time.  Marylen Ponto, NP  9:16 AM 10/26/2019

## 2020-02-05 NOTE — L&D Delivery Note (Addendum)
Delivery Note Pt pushed x 32 minutes. Variables worsened initially and pt was not close to delivery so Dr. Para March was called to evaluate for operative vaginal delivery. Variables improved so pushing was continued. At 2:30 AM a viable and healthy female was delivered via Vaginal, Spontaneous (Presentation: Left Occiput Anterior).  APGAR: 9, 9; weight pending.   Placenta status: Spontaneous, Intact.  Cord: 3 vessels with the following complications: None.  Cord pH: NA.  Low grade temp 100.2. Mild fetal tachycardia. No purulent or malodorous fluid. Tylenol given. Will CTO temp closely. ABX PRN. Pt planned post-placental IUD, but decision was made to not placed it due to temp. Plan to place it postpartum.  Anesthesia: Epidural Episiotomy: None Lacerations: None Suture Repair:  NA Est. Blood Loss (mL): 95  Mom to postpartum.  Baby to Couplet care / Skin to Skin.  Christie Parker 11/12/2020, 2:57 AM

## 2020-05-04 ENCOUNTER — Other Ambulatory Visit: Payer: Self-pay

## 2020-05-04 ENCOUNTER — Ambulatory Visit (INDEPENDENT_AMBULATORY_CARE_PROVIDER_SITE_OTHER): Payer: Medicaid Other

## 2020-05-04 VITALS — BP 126/69 | HR 96 | Ht 63.0 in | Wt 151.6 lb

## 2020-05-04 DIAGNOSIS — Z3201 Encounter for pregnancy test, result positive: Secondary | ICD-10-CM

## 2020-05-04 LAB — POCT PREGNANCY, URINE: Preg Test, Ur: POSITIVE — AB

## 2020-05-04 MED ORDER — PRENATAL VITAMIN 27-0.8 MG PO TABS: 1.0000 | ORAL_TABLET | Freq: Every day | ORAL | 6 refills | Status: DC

## 2020-05-04 NOTE — Progress Notes (Addendum)
Pt here today for UPT. UPT is positive today in office.  Pt denies any vaginal bleeding, cramps, nausea, vomiting at this time. Pt given safe medication list. Pt states is already taking PNV. Pt request Rx for PNV. Rx sent to pharmacy on file.    LMP 02/19/2020 EDD 11/25/2020 10wk 5 days today.   Pt advised will set up NEW OB appt today. Pt agreeable and verbalized understanding.   Lutisha Knoche,RN 05/04/20  Chart reviewed for nurse visit. Agree with plan of care.   Currie Paris, NP 05/04/2020 10:10 AM

## 2020-05-16 ENCOUNTER — Telehealth: Payer: Self-pay | Admitting: Emergency Medicine

## 2020-05-16 NOTE — Telephone Encounter (Signed)
   Christie Parker DOB: Apr 19, 1995 MRN: [id number]   RIDER WAIVER AND RELEASE OF LIABILITY  For purposes of improving physical access to our facilities, [facility] is pleased to partner with third parties to provide [facility] patients or other authorized individuals the option of convenient, on-demand ground transportation services (the [employer]") through use of the technology service that enables users to request on-demand ground transportation from independent third-party providers.  By opting to use and accept these [employer], I, the undersigned, hereby agree on behalf of myself, and on behalf of any minor child using the [employer] for whom I am the parent or legal guardian, as follows:  1. Science writer provided to me are provided by independent third-party transportation providers who are not [employer] or employees and who are unaffiliated with [employer]. 2. [facility] is neither a transportation carrier nor a common or public carrier. 3. [facility] has no control over the quality or safety of the transportation that occurs as a result of the [employer]. 4. [facility] cannot guarantee that any third-party transportation provider will complete any arranged transportation service. 5. [facility] makes no representation, warranty, or guarantee regarding the reliability, timeliness, quality, safety, suitability, or availability of any of the Transport Services or that they will be error free. 6. I fully understand that traveling by vehicle involves risks and dangers of serious bodily injury, including permanent disability, paralysis, and death. I agree, on behalf of myself and on behalf of any minor child using the Transport Services for whom I am the parent or legal guardian, that the entire risk arising out of my use of the [employer] remains solely with me, to the maximum extent permitted under applicable law. 7. The [employer]  are provided "as is" and "as available." [facility] disclaims all representations and warranties, express, implied or statutory, not expressly set out in these terms, including the implied warranties of merchantability and fitness for a particular purpose. 8. I hereby waive and release [facility], its agents, employees, officers, directors, representatives, insurers, attorneys, assigns, successors, subsidiaries, and affiliates from any and all past, present, or future claims, demands, liabilities, actions, causes of action, or suits of any kind directly or indirectly arising from acceptance and use of the [employer]. 9. I further waive and release [place] and its affiliates from all present and future liability and responsibility for any injury or death to persons or damages to property caused by or related to the use of the [employer]. 10. I have read this Waiver and Release of Liability, and I understand the terms used in it and their legal significance. This Waiver is freely and voluntarily given with the understanding that my right (as well as the right of any minor child for whom I am the parent or legal guardian using the [employer]) to legal recourse against [facility] in connection with the [employer] is knowingly surrendered in return for use of these services.   I attest that I read the consent document to Christie Parker, gave Christie Parker the opportunity to ask questions and answered the questions asked (if any). I affirm that Christie Parker then provided consent for she's participation in this program.     Christie Parker

## 2020-05-18 ENCOUNTER — Ambulatory Visit (INDEPENDENT_AMBULATORY_CARE_PROVIDER_SITE_OTHER): Payer: Medicaid Other

## 2020-05-18 DIAGNOSIS — Z3490 Encounter for supervision of normal pregnancy, unspecified, unspecified trimester: Secondary | ICD-10-CM | POA: Diagnosis not present

## 2020-05-18 DIAGNOSIS — Z23 Encounter for immunization: Secondary | ICD-10-CM | POA: Diagnosis not present

## 2020-05-18 NOTE — Progress Notes (Signed)
New OB Intake  I connected with  Shakisha Borsuk on 05/18/20 at 11:15 AM EDT by in person and verified that I am speaking with the correct person using two identifiers. Nurse is located at [facility] and pt is located at in office.  I discussed the limitations, risks, security and privacy concerns of performing an evaluation and management service by telephone and the availability of in person appointments. I also discussed with the patient that there may be a patient responsible charge related to this service. The patient expressed understanding and agreed to proceed.  I explained I am completing New OB Intake today. We discussed her EDD of 11/25/20 that is based on LMP of 02/19/20. Pt is G2P1. I reviewed her allergies, medications, Medical/Surgical/OB history, and appropriate screenings. I informed her of [facility] services. Based on history, this is a/an uncomplicated pregnancy.  Patient Active Problem List   Diagnosis Date Noted  . History of pre-eclampsia 07/21/2019  . History of prior pregnancy with IUGR newborn 07/16/2019    Concerns addressed today  Delivery Plans:  Plans to deliver at [facility].   MyChart/Babyscripts MyChart access verified. I explained pt will have some visits in office and some virtually. Babyscripts instructions given. Account successfully created and app downloaded.  Blood Pressure Cuff Pt reports that her husband has a BP cuff at home that she will use.    Anatomy US Explained first scheduled [place] will be around 19 weeks. Anatomy US scheduled for June 2 at 0845.   Labs Discussed Avelina Laine genetic screening with patient.  Routing OB labs drawn today- urine ob culture, preeclampsia labs drawn due to prior history of preeclampsia, HgbA1C,and flu vaccine administered.  Patient refused genetic testing.  Home Medicaid form completed.    Covid Vaccine Patient has covid vaccine.   [facility] Referral Patient is not interested in referral to [facility].   Pregnancy Navigator Informed  patient a pregnancy navigator will see her at her first ob visit with provider.   [place] appointments Childcare: Informed patient when she has Ultrasound appointments she will not be able to bring child/ children with her to Ultrasound appointments. Asked if childcare would be an issue.   First visit review I reviewed new OB appt with pt. I explained she will have a pelvic exam with pap smear as the pap result from [facility] stating endocervical component absent and the recommendation was to get another pap smear.  All other labs drawn at NEW OB intake appt along with pt completing Home Medicaid Form.  Flu vaccine given to pt today.   Explained pt will be seen by Dr. Alvester Morin at first visit; encounter routed to appropriate provider. If new patient offered monthly Zoom meeting  Ralene Bathe, RN 05/18/2020  11:29 AM

## 2020-05-19 LAB — COMPREHENSIVE METABOLIC PANEL
ALT: 11 IU/L (ref 0–32)
AST: 12 IU/L (ref 0–40)
Albumin/Globulin Ratio: 1.3 (ref 1.2–2.2)
Albumin: 4 g/dL (ref 3.9–5.0)
Alkaline Phosphatase: 60 IU/L (ref 44–121)
BUN/Creatinine Ratio: 10 (ref 9–23)
BUN: 5 mg/dL — ABNORMAL LOW (ref 6–20)
Bilirubin Total: 0.4 mg/dL (ref 0.0–1.2)
CO2: 19 mmol/L — ABNORMAL LOW (ref 20–29)
Calcium: 9.2 mg/dL (ref 8.7–10.2)
Chloride: 101 mmol/L (ref 96–106)
Creatinine, Ser: 0.51 mg/dL — ABNORMAL LOW (ref 0.57–1.00)
Globulin, Total: 3 g/dL (ref 1.5–4.5)
Glucose: 87 mg/dL (ref 65–99)
Potassium: 4.1 mmol/L (ref 3.5–5.2)
Sodium: 135 mmol/L (ref 134–144)
Total Protein: 7 g/dL (ref 6.0–8.5)
eGFR: 133 mL/min/{1.73_m2} (ref >59)

## 2020-05-19 LAB — CBC/D/PLT+RPR+RH+ABO+RUB AB...
Antibody Screen: NEGATIVE
Basophils Absolute: 0 10*3/uL (ref 0.0–0.2)
Basos: 1 %
EOS (ABSOLUTE): 0.1 10*3/uL (ref 0.0–0.4)
Eos: 1 %
HCV Ab: <0.1 s/co ratio (ref 0.0–0.9)
HIV Screen 4th Generation wRfx: NONREACTIVE
Hematocrit: 37.2 % (ref 34.0–46.6)
Hemoglobin: 12.3 g/dL (ref 11.1–15.9)
Hepatitis B Surface Ag: NEGATIVE
Immature Grans (Abs): 0 10*3/uL (ref 0.0–0.1)
Immature Granulocytes: 0 %
Lymphocytes Absolute: 2.1 10*3/uL (ref 0.7–3.1)
Lymphs: 30 %
MCH: 29.6 pg (ref 26.6–33.0)
MCHC: 33.1 g/dL (ref 31.5–35.7)
MCV: 90 fL (ref 79–97)
Monocytes Absolute: 0.5 10*3/uL (ref 0.1–0.9)
Monocytes: 7 %
Neutrophils Absolute: 4.3 10*3/uL (ref 1.4–7.0)
Neutrophils: 61 %
Platelets: 271 10*3/uL (ref 150–450)
RBC: 4.15 x10E6/uL (ref 3.77–5.28)
RDW: 12.9 % (ref 11.7–15.4)
RPR Ser Ql: NONREACTIVE
Rh Factor: POSITIVE
Rubella Antibodies, IGG: 1.16 index (ref >0.99)
WBC: 7 10*3/uL (ref 3.4–10.8)

## 2020-05-19 LAB — PROTEIN / CREATININE RATIO, URINE
Creatinine, Urine: 93.3 mg/dL
Protein, Ur: 8.6 mg/dL
Protein/Creat Ratio: 92 mg/g creat (ref 0–200)

## 2020-05-19 LAB — HEMOGLOBIN A1C
Est. average glucose Bld gHb Est-mCnc: 85 mg/dL
Hgb A1c MFr Bld: 4.6 % — ABNORMAL LOW (ref 4.8–5.6)

## 2020-05-19 LAB — HCV INTERPRETATION

## 2020-05-20 LAB — URINE CULTURE, OB REFLEX

## 2020-05-20 LAB — CULTURE, OB URINE

## 2020-05-22 ENCOUNTER — Encounter: Payer: Self-pay | Admitting: *Deleted

## 2020-05-24 ENCOUNTER — Telehealth: Payer: Self-pay | Admitting: Lactation Services

## 2020-05-24 NOTE — Telephone Encounter (Signed)
Called patient with assistance of [employer], # [id number].   LM for patient to call the office for results.

## 2020-05-24 NOTE — Telephone Encounter (Signed)
-----   Message from Federico Flake, MD sent at 05/23/2020  3:56 PM EDT ----- Prenatal labs are WNL. No concerns.  Baseline PEC labs WNL

## 2020-05-25 ENCOUNTER — Ambulatory Visit (INDEPENDENT_AMBULATORY_CARE_PROVIDER_SITE_OTHER): Payer: Medicaid Other | Admitting: Family Medicine

## 2020-05-25 ENCOUNTER — Other Ambulatory Visit (HOSPITAL_COMMUNITY)
Admission: RE | Admit: 2020-05-25 | Discharge: 2020-05-25 | Disposition: A | Discharge status: Home / Self Care | Payer: Medicaid Other | Source: Ambulatory Visit | Attending: Family Medicine | Admitting: Family Medicine

## 2020-05-25 ENCOUNTER — Other Ambulatory Visit: Payer: Self-pay

## 2020-05-25 VITALS — Wt 151.4 lb

## 2020-05-25 DIAGNOSIS — Z124 Encounter for screening for malignant neoplasm of cervix: Secondary | ICD-10-CM | POA: Diagnosis not present

## 2020-05-25 DIAGNOSIS — Z3491 Encounter for supervision of normal pregnancy, unspecified, first trimester: Secondary | ICD-10-CM

## 2020-05-25 DIAGNOSIS — Z8759 Personal history of other complications of pregnancy, childbirth and the puerperium: Secondary | ICD-10-CM

## 2020-05-25 NOTE — Progress Notes (Signed)
   PRENATAL VISIT NOTE  Subjective:  Christie Parker is a 25 y.o. G2P1001 at [redacted]w[redacted]d being seen today for ongoing prenatal care.  She is currently monitored for the following issues for this low-risk pregnancy and has History of prior pregnancy with IUGR newborn; History of pre-eclampsia; and Supervision of low-risk pregnancy on their problem list.  Patient reports no complaints.  Contractions: Not present. Vag. Bleeding: None.  Movement: Absent. Denies leaking of fluid.   The following portions of the patient's history were reviewed and updated as appropriate: allergies, current medications, past family history, past medical history, past social history, past surgical history and problem list.   Objective:   Vitals:   05/25/20 1038  Weight: 151 lb 6.4 oz (68.7 kg)    Fetal Status: Fetal Heart Rate (bpm): 140   Movement: Absent     General:  Alert, oriented and cooperative. Patient is in no acute distress.  Skin: Skin is warm and dry. No rash noted.   Cardiovascular: Normal heart rate noted  Respiratory: Normal respiratory effort, no problems with respiration noted  Abdomen: Soft, gravid, appropriate for gestational age.  Pain/Pressure: Absent     Pelvic: Cervical exam deferred        Extremities: Normal range of motion.  Edema: None  Mental Status: Normal mood and affect. Normal behavior. Normal judgment and thought content.   Assessment and Plan:  Pregnancy: G2P1001 at [redacted]w[redacted]d 1. Cervical cancer screening - Cytology - PAP( [place])  2. Encounter for supervision of low-risk pregnancy in first trimester Updated box Reviewed care at [facility] Has not had dental screening- given resource list.   3. History of prior pregnancy with IUGR newborn  4. History of pre-eclampsia BP WNL today  Preterm labor symptoms and general obstetric precautions including but not limited to vaginal bleeding, contractions, leaking of fluid and fetal movement were reviewed in detail with the patient. Please  refer to After Visit Summary for other counseling recommendations.   Return in about 4 weeks (around 06/22/2020) for Routine prenatal care.  Future Appointments  Date Time Provider Department Center  06/23/2020  9:55 AM Kathlene Cote [facility]  07/06/2020  8:45 AM [facility] NURSE [facility]  07/06/2020  9:00 AM [facility] US1 [facility]    Federico Flake, MD

## 2020-05-25 NOTE — Patient Instructions (Signed)
    Dental Resources   [place]   Dr. Carl Little  Exam $85   [address]  Extraction $120 and up   [place], [place]  *full list of prices available*   [phone]      [facility]  Exam $94   [address]  Exam w/ Xrays $380   [place], [place]  Xrays $68 and up   [phone]  Cleaning $101   Extraction $190 and up      [facility]  Cleaning + Xray $344   [address]  Extraction- pt has to be seen first to give price   [place], [place]   [phone]     [place]   Dr. Ben Turner/Dr. Clay Burton  Exam, Cleaning, Xray $262   [address]  Extraction $207-$317   [place]   [phone]      [facility]  Cleaning $5   [address]  Xray $5   [place], [place] [address]  Call to get on waiting list   [phone] ext [phone]     Dr. James McMasters/Dr. Eric Sadler   [address]  Xray $85 Each   [place], [place] [address]  Extraction $200    Dr. Stacey Green  Extraction $300 per tooth   [address]   [place], [place] [address]   [phone]      Dr. Janna Civils  Cleaning $300   [address]  Extraction $273   [place], [place] [address]   [phone]     [place]   [facility]  Emergency Exam $65   [address]  Cleaning & Exam $150   [place], [place] [address]  Extractions: Simple $180 Surgical $250   [phone]  Fillings $150-$225     

## 2020-05-25 NOTE — Progress Notes (Signed)
Baby heartbeat was scanned with handheld ultrasound w/ Dr Alvester Morin. Patient also declined "Genetic Screening".

## 2020-05-26 LAB — CYTOLOGY - PAP
Chlamydia: NEGATIVE
Comment: NEGATIVE
Comment: NEGATIVE
Comment: NORMAL
Diagnosis: NEGATIVE
Neisseria Gonorrhea: NEGATIVE
Trichomonas: NEGATIVE

## 2020-06-06 NOTE — Progress Notes (Signed)
Attestation of Attending Supervision of clinical support staff: I agree with the care provided to this patient and was available for any consultation.  I have reviewed the RN's note and chart. I was available for consult and to see the patient if needed.   Legrande Hao Niles Tocara Mennen, MD, MPH, ABFM Attending Physician Faculty Practice- [facility]  

## 2020-06-23 ENCOUNTER — Ambulatory Visit (INDEPENDENT_AMBULATORY_CARE_PROVIDER_SITE_OTHER): Payer: Medicaid Other | Admitting: Medical

## 2020-06-23 ENCOUNTER — Encounter: Payer: Self-pay | Admitting: Medical

## 2020-06-23 ENCOUNTER — Other Ambulatory Visit: Payer: Self-pay

## 2020-06-23 VITALS — BP 115/75 | HR 102 | Wt 155.1 lb

## 2020-06-23 DIAGNOSIS — Z3A17 17 weeks gestation of pregnancy: Secondary | ICD-10-CM

## 2020-06-23 DIAGNOSIS — Z8759 Personal history of other complications of pregnancy, childbirth and the puerperium: Secondary | ICD-10-CM

## 2020-06-23 DIAGNOSIS — Z3492 Encounter for supervision of normal pregnancy, unspecified, second trimester: Secondary | ICD-10-CM

## 2020-06-23 NOTE — Patient Instructions (Signed)

## 2020-06-23 NOTE — Progress Notes (Signed)
   PRENATAL VISIT NOTE  Subjective:  Christie Parker is a 25 y.o. G2P1001 at [redacted]w[redacted]d being seen today for ongoing prenatal care.  She is currently monitored for the following issues for this high-risk pregnancy and has History of prior pregnancy with IUGR newborn; History of pre-eclampsia; and Supervision of low-risk pregnancy on their problem list.  Patient reports no complaints.  Contractions: Not present. Vag. Bleeding: None.  Movement: Present. Denies leaking of fluid.   The following portions of the patient's history were reviewed and updated as appropriate: allergies, current medications, past family history, past medical history, past social history, past surgical history and problem list.   Objective:   Vitals:   06/23/20 1018  BP: 115/75  Pulse: (!) 102  Weight: 155 lb 1.6 oz (70.4 kg)    Fetal Status: Fetal Heart Rate (bpm): 147   Movement: Present     General:  Alert, oriented and cooperative. Patient is in no acute distress.  Skin: Skin is warm and dry. No rash noted.   Cardiovascular: Normal heart rate noted  Respiratory: Normal respiratory effort, no problems with respiration noted  Abdomen: Soft, gravid, appropriate for gestational age.  Pain/Pressure: Absent     Pelvic: Cervical exam deferred        Extremities: Normal range of motion.  Edema: None  Mental Status: Normal mood and affect. Normal behavior. Normal judgment and thought content.   Assessment and Plan:  Pregnancy: G2P1001 at [redacted]w[redacted]d 1. Encounter for supervision of low-risk pregnancy in second trimester - [facility]ses [facility] - Anatomy US scheduled 07/06/20  2. History of pre-eclampsia - Normotensive today - Denies S/S of pre-eclampsia   3. History of prior pregnancy with IUGR newborn  4. [redacted] weeks gestation of pregnancy  Preterm labor symptoms and general obstetric precautions including but not limited to vaginal bleeding, contractions, leaking of fluid and fetal movement were reviewed in detail with  the patient. Please refer to After Visit Summary for other counseling recommendations.   Return in about 4 weeks (around 07/21/2020) for LOB, In-Person.  Future Appointments  Date Time Provider Department Center  07/06/2020  8:45 AM [facility] NURSE [facility]  07/06/2020  9:00 AM [facility] US1 [facility]  07/21/2020 10:55 AM Magnus Sinning, Dimas [place], PA-C [facility]    Vonzella Nipple, PA-C

## 2020-07-06 ENCOUNTER — Other Ambulatory Visit: Payer: Self-pay

## 2020-07-06 ENCOUNTER — Other Ambulatory Visit: Payer: Self-pay | Admitting: *Deleted

## 2020-07-06 ENCOUNTER — Ambulatory Visit: Payer: Medicaid Other | Attending: Obstetrics and Gynecology

## 2020-07-06 ENCOUNTER — Ambulatory Visit: Payer: Medicaid Other | Admitting: *Deleted

## 2020-07-06 ENCOUNTER — Encounter: Payer: Self-pay | Admitting: *Deleted

## 2020-07-06 VITALS — BP 121/70 | HR 115

## 2020-07-06 DIAGNOSIS — O09892 Supervision of other high risk pregnancies, second trimester: Secondary | ICD-10-CM | POA: Diagnosis not present

## 2020-07-06 DIAGNOSIS — Z3490 Encounter for supervision of normal pregnancy, unspecified, unspecified trimester: Secondary | ICD-10-CM | POA: Diagnosis not present

## 2020-07-06 DIAGNOSIS — Z3689 Encounter for other specified antenatal screening: Secondary | ICD-10-CM

## 2020-07-21 ENCOUNTER — Other Ambulatory Visit: Payer: Self-pay

## 2020-07-21 ENCOUNTER — Ambulatory Visit (INDEPENDENT_AMBULATORY_CARE_PROVIDER_SITE_OTHER): Payer: Medicaid Other | Admitting: Medical

## 2020-07-21 ENCOUNTER — Encounter: Payer: Self-pay | Admitting: Medical

## 2020-07-21 VITALS — BP 121/82 | HR 103 | Wt 161.4 lb

## 2020-07-21 DIAGNOSIS — O283 Abnormal ultrasonic finding on antenatal screening of mother: Secondary | ICD-10-CM

## 2020-07-21 DIAGNOSIS — Z3A23 23 weeks gestation of pregnancy: Secondary | ICD-10-CM

## 2020-07-21 DIAGNOSIS — Z8759 Personal history of other complications of pregnancy, childbirth and the puerperium: Secondary | ICD-10-CM

## 2020-07-21 DIAGNOSIS — Z3492 Encounter for supervision of normal pregnancy, unspecified, second trimester: Secondary | ICD-10-CM

## 2020-07-21 NOTE — Progress Notes (Signed)
   PRENATAL VISIT NOTE  Subjective:  Christie Parker is a 25 y.o. G2P1001 at [redacted]w[redacted]d being seen today for ongoing prenatal care.  She is currently monitored for the following issues for this low-risk pregnancy and has History of prior pregnancy with IUGR newborn; History of pre-eclampsia; Supervision of low-risk pregnancy; and Echogenic intracardiac focus of fetus on prenatal ultrasound on their problem list.  Patient reports no complaints.  Contractions: Not present. Vag. Bleeding: None.  Movement: Present. Denies leaking of fluid.   The following portions of the patient's history were reviewed and updated as appropriate: allergies, current medications, past family history, past medical history, past social history, past surgical history and problem list.   Objective:   Vitals:   07/21/20 1117  BP: 121/82  Pulse: (!) 103  Weight: 161 lb 6.4 oz (73.2 kg)    Fetal Status: Fetal Heart Rate (bpm): 136 Fundal Height: 23 cm Movement: Present     General:  Alert, oriented and cooperative. Patient is in no acute distress.  Skin: Skin is warm and dry. No rash noted.   Cardiovascular: Normal heart rate noted  Respiratory: Normal respiratory effort, no problems with respiration noted  Abdomen: Soft, gravid, appropriate for gestational age.  Pain/Pressure: Absent     Pelvic: Cervical exam deferred        Extremities: Normal range of motion.  Edema: None  Mental Status: Normal mood and affect. Normal behavior. Normal judgment and thought content.   Assessment and Plan:  Pregnancy: G2P1001 at [redacted]w[redacted]d 1. Encounter for supervision of low-risk pregnancy in second trimester - Planning IP Circ - Anticipatory guidance for 2 hour GTT next visit discussed   2. History of pre-eclampsia - Normotensive today  3. History of prior pregnancy with IUGR newborn - Will have growth scans throughout third trimester  - Next [place] scheduled 08/17/20  4. [redacted] weeks gestation of pregnancy  5. Echogenic intracardiac focus  of fetus on prenatal ultrasound - Noted on Anatomy scan, isolated finding, patient reassured, will follow-up with next [place] as scheduled   Preterm labor symptoms and general obstetric precautions including but not limited to vaginal bleeding, contractions, leaking of fluid and fetal movement were reviewed in detail with the patient. Please refer to After Visit Summary for other counseling recommendations.   Return in about 4 weeks (around 08/18/2020) for LOB, 28 week labs (fasting), In-Person, any provider.  Future Appointments  Date Time Provider Department Center  08/17/2020  3:00 PM [facility]SE [facility]  08/17/2020  3:15 PM [facility] US2 [facility]    Vonzella Nipple, PA-C

## 2020-08-17 ENCOUNTER — Other Ambulatory Visit: Payer: Medicaid Other

## 2020-08-17 ENCOUNTER — Ambulatory Visit: Payer: Medicaid Other

## 2020-08-17 ENCOUNTER — Ambulatory Visit (INDEPENDENT_AMBULATORY_CARE_PROVIDER_SITE_OTHER): Payer: Medicaid Other | Admitting: Obstetrics & Gynecology

## 2020-08-17 ENCOUNTER — Other Ambulatory Visit: Payer: Self-pay

## 2020-08-17 VITALS — BP 119/69 | HR 99 | Wt 164.9 lb

## 2020-08-17 DIAGNOSIS — Z3493 Encounter for supervision of normal pregnancy, unspecified, third trimester: Secondary | ICD-10-CM

## 2020-08-17 DIAGNOSIS — Z23 Encounter for immunization: Secondary | ICD-10-CM | POA: Diagnosis not present

## 2020-08-17 DIAGNOSIS — Z3492 Encounter for supervision of normal pregnancy, unspecified, second trimester: Secondary | ICD-10-CM

## 2020-08-17 DIAGNOSIS — Z8759 Personal history of other complications of pregnancy, childbirth and the puerperium: Secondary | ICD-10-CM

## 2020-08-17 DIAGNOSIS — Z3A27 27 weeks gestation of pregnancy: Secondary | ICD-10-CM

## 2020-08-17 NOTE — Progress Notes (Signed)
   PRENATAL VISIT NOTE  Subjective:  Christie Parker is a 25 y.o. G2P1001 at [redacted]w[redacted]d being seen today for ongoing prenatal care.  She is currently monitored for the following issues for this high-risk pregnancy and has History of prior pregnancy with IUGR newborn; History of pre-eclampsia; Supervision of low-risk pregnancy; and Echogenic intracardiac focus of fetus on prenatal ultrasound on their problem list.  Patient reports no complaints.  Contractions: Not present. Vag. Bleeding: None.  Movement: Present. Denies leaking of fluid.   The following portions of the patient's history were reviewed and updated as appropriate: allergies, current medications, past family history, past medical history, past social history, past surgical history and problem list.   Objective:   Vitals:   08/17/20 0856  BP: 119/69  Pulse: 99  Weight: 164 lb 14.4 oz (74.8 kg)    Fetal Status: Fetal Heart Rate (bpm): 155   Movement: Present     General:  Alert, oriented and cooperative. Patient is in no acute distress.  Skin: Skin is warm and dry. No rash noted.   Cardiovascular: Normal heart rate noted  Respiratory: Normal respiratory effort, no problems with respiration noted  Abdomen: Soft, gravid, appropriate for gestational age.  Pain/Pressure: Absent     Pelvic: Cervical exam deferred        Extremities: Normal range of motion.  Edema: None  Mental Status: Normal mood and affect. Normal behavior. Normal judgment and thought content.   Assessment and Plan:  Pregnancy: G2P1001 at [redacted]w[redacted]d 1. History of prior pregnancy with IUGR newborn Follow up scan today at [facility]  2. [redacted] weeks gestation of pregnancy 3. Encounter for supervision of low-risk pregnancy in second trimester Third trimester labs and Tdap today. - Tdap vaccine greater than or equal to 7yo IM Preterm labor symptoms and general obstetric precautions including but not limited to vaginal bleeding, contractions, leaking of fluid and fetal movement were  reviewed in detail with the patient. Please refer to After Visit Summary for other counseling recommendations.   Return in about 3 weeks (around 09/07/2020) for OFFICE OB VISIT (MD only).  Future Appointments  Date Time Provider Department Center  08/17/2020  3:00 PM [facility] NURSE [facility]  08/17/2020  3:15 PM [facility] US2 [facility]    Jaynie Collins, MD

## 2020-08-18 LAB — CBC
Hematocrit: 35.8 % (ref 34.0–46.6)
Hemoglobin: 11.8 g/dL (ref 11.1–15.9)
MCH: 29.9 pg (ref 26.6–33.0)
MCHC: 33 g/dL (ref 31.5–35.7)
MCV: 91 fL (ref 79–97)
Platelets: 246 10*3/uL (ref 150–450)
RBC: 3.94 x10E6/uL (ref 3.77–5.28)
RDW: 11.9 % (ref 11.7–15.4)
WBC: 10 10*3/uL (ref 3.4–10.8)

## 2020-08-18 LAB — RPR: RPR Ser Ql: NONREACTIVE

## 2020-08-18 LAB — GLUCOSE TOLERANCE, 2 HOURS W/ 1HR
Glucose, 1 hour: 169 mg/dL (ref 65–179)
Glucose, 2 hour: 109 mg/dL (ref 65–152)
Glucose, Fasting: 75 mg/dL (ref 65–91)

## 2020-08-18 LAB — HIV ANTIBODY (ROUTINE TESTING W REFLEX): HIV Screen 4th Generation wRfx: NONREACTIVE

## 2020-09-01 ENCOUNTER — Ambulatory Visit (HOSPITAL_BASED_OUTPATIENT_CLINIC_OR_DEPARTMENT_OTHER): Payer: Medicaid Other

## 2020-09-01 ENCOUNTER — Other Ambulatory Visit: Payer: Self-pay

## 2020-09-01 ENCOUNTER — Ambulatory Visit: Payer: Medicaid Other | Attending: Obstetrics and Gynecology | Admitting: *Deleted

## 2020-09-01 ENCOUNTER — Other Ambulatory Visit: Payer: Self-pay | Admitting: *Deleted

## 2020-09-01 VITALS — BP 121/64 | HR 91

## 2020-09-01 DIAGNOSIS — Z3689 Encounter for other specified antenatal screening: Secondary | ICD-10-CM | POA: Diagnosis not present

## 2020-09-01 DIAGNOSIS — Z3A29 29 weeks gestation of pregnancy: Secondary | ICD-10-CM | POA: Insufficient documentation

## 2020-09-01 DIAGNOSIS — Z8759 Personal history of other complications of pregnancy, childbirth and the puerperium: Secondary | ICD-10-CM

## 2020-09-01 DIAGNOSIS — Z3493 Encounter for supervision of normal pregnancy, unspecified, third trimester: Secondary | ICD-10-CM

## 2020-09-01 DIAGNOSIS — O09293 Supervision of pregnancy with other poor reproductive or obstetric history, third trimester: Secondary | ICD-10-CM | POA: Diagnosis not present

## 2020-09-06 ENCOUNTER — Encounter: Payer: Self-pay | Admitting: Obstetrics and Gynecology

## 2020-09-06 ENCOUNTER — Other Ambulatory Visit: Payer: Self-pay

## 2020-09-06 ENCOUNTER — Ambulatory Visit (INDEPENDENT_AMBULATORY_CARE_PROVIDER_SITE_OTHER): Payer: Medicaid Other | Admitting: Obstetrics and Gynecology

## 2020-09-06 VITALS — BP 124/71 | HR 99 | Wt 169.9 lb

## 2020-09-06 DIAGNOSIS — O283 Abnormal ultrasonic finding on antenatal screening of mother: Secondary | ICD-10-CM

## 2020-09-06 DIAGNOSIS — Z8759 Personal history of other complications of pregnancy, childbirth and the puerperium: Secondary | ICD-10-CM

## 2020-09-06 DIAGNOSIS — Z3493 Encounter for supervision of normal pregnancy, unspecified, third trimester: Secondary | ICD-10-CM

## 2020-09-06 NOTE — Patient Instructions (Signed)

## 2020-09-06 NOTE — Progress Notes (Signed)
Subjective:  Christie Parker is a 25 y.o. G2P1001 at [redacted]w[redacted]d being seen today for ongoing prenatal care.  She is currently monitored for the following issues for this high-risk pregnancy and has History of prior pregnancy with IUGR newborn; History of pre-eclampsia; Supervision of low-risk pregnancy; and Echogenic intracardiac focus of fetus on prenatal ultrasound on their problem list.  Patient reports no complaints.  Contractions: Not present. Vag. Bleeding: None.  Movement: Present. Denies leaking of fluid.   The following portions of the patient's history were reviewed and updated as appropriate: allergies, current medications, past family history, past medical history, past social history, past surgical history and problem list. Problem list updated.  Objective:   Vitals:   09/06/20 1035  BP: 124/71  Pulse: 99  Weight: 169 lb 14.4 oz (77.1 kg)    Fetal Status: Fetal Heart Rate (bpm): 141   Movement: Present     General:  Alert, oriented and cooperative. Patient is in no acute distress.  Skin: Skin is warm and dry. No rash noted.   Cardiovascular: Normal heart rate noted  Respiratory: Normal respiratory effort, no problems with respiration noted  Abdomen: Soft, gravid, appropriate for gestational age. Pain/Pressure: Absent     Pelvic:  Cervical exam deferred        Extremities: Normal range of motion.  Edema: None  Mental Status: Normal mood and affect. Normal behavior. Normal judgment and thought content.   Urinalysis:      Assessment and Plan:  Pregnancy: G2P1001 at [redacted]w[redacted]d  1. Encounter for supervision of low-risk pregnancy in third trimester Stable   2. Echogenic intracardiac focus of fetus on prenatal ultrasound Normal growth 7/29, f/u 8/29  3. History of pre-eclampsia BP stable No S/Sx at present   4. History of prior pregnancy with IUGR newborn Serial growth scans as noted above  Preterm labor symptoms and general obstetric precautions including but not limited to  vaginal bleeding, contractions, leaking of fluid and fetal movement were reviewed in detail with the patient. Please refer to After Visit Summary for other counseling recommendations.  Return in about 2 weeks (around 09/20/2020) for OB visit, face to face, MD only.   Hermina Staggers, MD

## 2020-09-25 ENCOUNTER — Other Ambulatory Visit: Payer: Self-pay

## 2020-09-25 ENCOUNTER — Ambulatory Visit (INDEPENDENT_AMBULATORY_CARE_PROVIDER_SITE_OTHER): Payer: Medicaid Other | Admitting: Obstetrics and Gynecology

## 2020-09-25 VITALS — BP 110/76 | HR 98 | Wt 172.0 lb

## 2020-09-25 DIAGNOSIS — Z3493 Encounter for supervision of normal pregnancy, unspecified, third trimester: Secondary | ICD-10-CM

## 2020-09-25 DIAGNOSIS — O283 Abnormal ultrasonic finding on antenatal screening of mother: Secondary | ICD-10-CM

## 2020-09-25 DIAGNOSIS — Z3A32 32 weeks gestation of pregnancy: Secondary | ICD-10-CM | POA: Insufficient documentation

## 2020-09-25 DIAGNOSIS — Z8759 Personal history of other complications of pregnancy, childbirth and the puerperium: Secondary | ICD-10-CM

## 2020-09-25 NOTE — Progress Notes (Signed)
   PRENATAL VISIT NOTE  Subjective:  Christie Parker is a 25 y.o. G2P1001 at [redacted]w[redacted]d being seen today for ongoing prenatal care.  She is currently monitored for the following issues for this high-risk pregnancy and has History of prior pregnancy with IUGR newborn; History of pre-eclampsia; Supervision of low-risk pregnancy; Echogenic intracardiac focus of fetus on prenatal ultrasound; and [redacted] weeks gestation of pregnancy on their problem list.  Patient doing well with no acute concerns today. She reports no complaints.  Contractions: Not present. Vag. Bleeding: None.  Movement: Present. Denies leaking of fluid.    The following portions of the patient's history were reviewed and updated as appropriate: allergies, current medications, past family history, past medical history, past social history, past surgical history and problem list. Problem list updated.  Objective:   Vitals:   09/25/20 1519  BP: 110/76  Pulse: 98  Weight: 172 lb (78 kg)    Fetal Status: Fetal Heart Rate (bpm): 142 Fundal Height: 33 cm Movement: Present     General:  Alert, oriented and cooperative. Patient is in no acute distress.  Skin: Skin is warm and dry. No rash noted.   Cardiovascular: Normal heart rate noted  Respiratory: Normal respiratory effort, no problems with respiration noted  Abdomen: Soft, gravid, appropriate for gestational age.  Pain/Pressure: Absent     Pelvic: Cervical exam deferred        Extremities: Normal range of motion.  Edema: None  Mental Status:  Normal mood and affect. Normal behavior. Normal judgment and thought content.   Assessment and Plan:  Pregnancy: G2P1001 at [redacted]w[redacted]d  1. Encounter for supervision of low-risk pregnancy in third trimester Continue routine prenatal care  2. [redacted] weeks gestation of pregnancy   3. History of prior pregnancy with IUGR newborn Pt has repeat growth with [facility] on 8/29  4. History of pre-eclampsia BP WNL  5. Echogenic intracardiac focus of fetus on  prenatal ultrasound   Preterm labor symptoms and general obstetric precautions including but not limited to vaginal bleeding, contractions, leaking of fluid and fetal movement were reviewed in detail with the patient.  Please refer to After Visit Summary for other counseling recommendations.   Return in about 2 weeks (around 10/09/2020) for [facility], in person.   Mariel Aloe, MD Faculty Attending Center [facility]

## 2020-10-02 ENCOUNTER — Encounter: Payer: Self-pay | Admitting: *Deleted

## 2020-10-02 ENCOUNTER — Ambulatory Visit: Payer: Medicaid Other | Attending: Maternal & Fetal Medicine

## 2020-10-02 ENCOUNTER — Ambulatory Visit: Payer: Medicaid Other | Admitting: *Deleted

## 2020-10-02 ENCOUNTER — Other Ambulatory Visit: Payer: Self-pay

## 2020-10-02 VITALS — BP 121/68 | HR 98

## 2020-10-02 DIAGNOSIS — O09293 Supervision of pregnancy with other poor reproductive or obstetric history, third trimester: Secondary | ICD-10-CM

## 2020-10-02 DIAGNOSIS — O365930 Maternal care for other known or suspected poor fetal growth, third trimester, not applicable or unspecified: Secondary | ICD-10-CM

## 2020-10-02 DIAGNOSIS — Z8759 Personal history of other complications of pregnancy, childbirth and the puerperium: Secondary | ICD-10-CM | POA: Insufficient documentation

## 2020-10-02 DIAGNOSIS — Z3A33 33 weeks gestation of pregnancy: Secondary | ICD-10-CM | POA: Diagnosis not present

## 2020-10-02 DIAGNOSIS — O358XX0 Maternal care for other (suspected) fetal abnormality and damage, not applicable or unspecified: Secondary | ICD-10-CM

## 2020-10-02 DIAGNOSIS — Z3493 Encounter for supervision of normal pregnancy, unspecified, third trimester: Secondary | ICD-10-CM | POA: Insufficient documentation

## 2020-10-12 ENCOUNTER — Ambulatory Visit (INDEPENDENT_AMBULATORY_CARE_PROVIDER_SITE_OTHER): Payer: Medicaid Other | Admitting: Family Medicine

## 2020-10-12 ENCOUNTER — Other Ambulatory Visit: Payer: Self-pay

## 2020-10-12 VITALS — BP 116/78 | HR 92 | Wt 176.0 lb

## 2020-10-12 DIAGNOSIS — Z3493 Encounter for supervision of normal pregnancy, unspecified, third trimester: Secondary | ICD-10-CM

## 2020-10-12 DIAGNOSIS — Z8759 Personal history of other complications of pregnancy, childbirth and the puerperium: Secondary | ICD-10-CM

## 2020-10-12 NOTE — Progress Notes (Signed)
   PRENATAL VISIT NOTE  Subjective:  Christie Parker is a 25 y.o. G2P1001 at [redacted]w[redacted]d being seen today for ongoing prenatal care.  She is currently monitored for the following issues for this low-risk pregnancy and has History of prior pregnancy with IUGR newborn; History of pre-eclampsia; Supervision of low-risk pregnancy; and Echogenic intracardiac focus of fetus on prenatal ultrasound on their problem list.  Patient reports no complaints.  Contractions: Not present. Vag. Bleeding: None.  Movement: Present. Denies leaking of fluid.   The following portions of the patient's history were reviewed and updated as appropriate: allergies, current medications, past family history, past medical history, past social history, past surgical history and problem list.   Objective:   Vitals:   10/12/20 1411  BP: 116/78  Pulse: 92  Weight: 176 lb (79.8 kg)    Fetal Status: Fetal Heart Rate (bpm): 141   Movement: Present     General:  Alert, oriented and cooperative. Patient is in no acute distress.  Skin: Skin is warm and dry. No rash noted.   Cardiovascular: Normal heart rate noted  Respiratory: Normal respiratory effort, no problems with respiration noted  Abdomen: Soft, gravid, appropriate for gestational age.  Pain/Pressure: Absent     Pelvic: Cervical exam deferred        Extremities: Normal range of motion.  Edema: None  Mental Status: Normal mood and affect. Normal behavior. Normal judgment and thought content.   Assessment and Plan:  Pregnancy: G2P1001 at [redacted]w[redacted]d 1. Encounter for supervision of low-risk pregnancy in third trimester Doing well no concerns Brought 77 mon old who was crying during visit  2. History of prior pregnancy with IUGR newborn FH appropriately today  3. History of pre-eclampsia BP appropriated  Preterm labor symptoms and general obstetric precautions including but not limited to vaginal bleeding, contractions, leaking of fluid and fetal movement were reviewed in  detail with the patient. Please refer to After Visit Summary for other counseling recommendations.   Return in about 1 week (around 10/19/2020) for Routine prenatal care, 36wks.  No future appointments.  Federico Flake, MD

## 2020-10-19 NOTE — Progress Notes (Signed)
   PRENATAL VISIT NOTE  Subjective:  Christie Parker is a 25 y.o. G2P1001 at [redacted]w[redacted]d being seen today for ongoing prenatal care.  She is currently monitored for the following issues for this low-risk pregnancy and has History of prior pregnancy with IUGR newborn; History of pre-eclampsia; Supervision of low-risk pregnancy; and Echogenic intracardiac focus of fetus on prenatal ultrasound on their problem list.  Patient reports no complaints.  Contractions: Not present. Vag. Bleeding: None.  Movement: Present. Denies leaking of fluid.   The following portions of the patient's history were reviewed and updated as appropriate: allergies, current medications, past family history, past medical history, past social history, past surgical history and problem list.   Objective:   Vitals:   10/20/20 0929  BP: 116/74  Pulse: 98  Weight: 175 lb 12.8 oz (79.7 kg)    Fetal Status: Fetal Heart Rate (bpm): 140   Movement: Present     General:  Alert, oriented and cooperative. Patient is in no acute distress.  Skin: Skin is warm and dry. No rash noted.   Cardiovascular: Normal heart rate noted  Respiratory: Normal respiratory effort, no problems with respiration noted  Abdomen: Soft, gravid, appropriate for gestational age.  Pain/Pressure: Present     Pelvic: Cultures collected - CE declined by pt         Extremities: Normal range of motion.  Edema: None  Mental Status: Normal mood and affect. Normal behavior. Normal judgment and thought content.   Assessment and Plan:  Pregnancy: G2P1001 at [redacted]w[redacted]d 1. History of prior pregnancy with IUGR newborn - Most recent growth was 59%ile on 8/29 - No further scans indicated  2. History of pre-eclampsia - Blood pressure normal today  3. Encounter for supervision of low-risk pregnancy in third trimester - GBS/GC/CT done today - Labor s/sx reviewed  4. Echogenic intracardiac focus of fetus on prenatal ultrasound - See on 7/29 - no mentioned noted on 8/29.  -  Declined NIPS  Preterm labor symptoms and general obstetric precautions including but not limited to vaginal bleeding, contractions, leaking of fluid and fetal movement were reviewed in detail with the patient. Please refer to After Visit Summary for other counseling recommendations.   Return in about 1 week (around 10/27/2020) for OB VISIT, MD or APP.  No future appointments.   Milas Hock, MD

## 2020-10-20 ENCOUNTER — Ambulatory Visit (INDEPENDENT_AMBULATORY_CARE_PROVIDER_SITE_OTHER): Payer: Medicaid Other | Admitting: Obstetrics and Gynecology

## 2020-10-20 ENCOUNTER — Encounter: Payer: Self-pay | Admitting: Obstetrics and Gynecology

## 2020-10-20 ENCOUNTER — Other Ambulatory Visit: Payer: Self-pay

## 2020-10-20 ENCOUNTER — Other Ambulatory Visit (HOSPITAL_COMMUNITY)
Admission: RE | Admit: 2020-10-20 | Discharge: 2020-10-20 | Disposition: A | Discharge status: Home / Self Care | Payer: Medicaid Other | Source: Ambulatory Visit | Attending: Obstetrics and Gynecology | Admitting: Obstetrics and Gynecology

## 2020-10-20 VITALS — BP 116/74 | HR 98 | Wt 175.8 lb

## 2020-10-20 DIAGNOSIS — Z3493 Encounter for supervision of normal pregnancy, unspecified, third trimester: Secondary | ICD-10-CM

## 2020-10-20 DIAGNOSIS — Z8759 Personal history of other complications of pregnancy, childbirth and the puerperium: Secondary | ICD-10-CM

## 2020-10-20 DIAGNOSIS — O283 Abnormal ultrasonic finding on antenatal screening of mother: Secondary | ICD-10-CM

## 2020-10-23 LAB — CERVICOVAGINAL ANCILLARY ONLY
Bacterial Vaginitis (gardnerella): NEGATIVE
Candida Glabrata: NEGATIVE
Candida Vaginitis: POSITIVE — AB
Chlamydia: NEGATIVE
Comment: NEGATIVE
Comment: NEGATIVE
Comment: NEGATIVE
Comment: NEGATIVE
Comment: NEGATIVE
Comment: NORMAL
Neisseria Gonorrhea: NEGATIVE
Trichomonas: NEGATIVE

## 2020-10-24 LAB — CULTURE, BETA STREP (GROUP B ONLY): Strep Gp B Culture: NEGATIVE

## 2020-11-03 ENCOUNTER — Other Ambulatory Visit: Payer: Self-pay

## 2020-11-03 ENCOUNTER — Encounter: Payer: Self-pay | Admitting: Family Medicine

## 2020-11-03 ENCOUNTER — Ambulatory Visit (INDEPENDENT_AMBULATORY_CARE_PROVIDER_SITE_OTHER): Payer: Medicaid Other | Admitting: Family Medicine

## 2020-11-03 VITALS — BP 121/72 | HR 105 | Wt 177.0 lb

## 2020-11-03 DIAGNOSIS — Z3493 Encounter for supervision of normal pregnancy, unspecified, third trimester: Secondary | ICD-10-CM

## 2020-11-03 NOTE — Patient Instructions (Signed)

## 2020-11-03 NOTE — Progress Notes (Signed)
   PRENATAL VISIT NOTE  Subjective:  Christie Parker is a 25 y.o. G2P1001 at [redacted]w[redacted]d being seen today for ongoing prenatal care.  She is currently monitored for the following issues for this low-risk pregnancy and has History of prior pregnancy with IUGR newborn; History of pre-eclampsia; Supervision of low-risk pregnancy; and Echogenic intracardiac focus of fetus on prenatal ultrasound on their problem list.  Patient reports no complaints.  Contractions: Not present. Vag. Bleeding: None.  Movement: Present. Denies leaking of fluid.   The following portions of the patient's history were reviewed and updated as appropriate: allergies, current medications, past family history, past medical history, past social history, past surgical history and problem list.   Objective:   Vitals:   11/03/20 0951  BP: 121/72  Pulse: (!) 105  Weight: 177 lb (80.3 kg)    Fetal Status: Fetal Heart Rate (bpm): 140   Movement: Present     General:  Alert, oriented and cooperative. Patient is in no acute distress.  Skin: Skin is warm and dry. No rash noted.   Cardiovascular: Normal heart rate noted  Respiratory: Normal respiratory effort, no problems with respiration noted  Abdomen: Soft, gravid, appropriate for gestational age.  Pain/Pressure: Present     Pelvic: Cervical exam deferred        Extremities: Normal range of motion.  Edema: None  Mental Status: Normal mood and affect. Normal behavior. Normal judgment and thought content.   Assessment and Plan:  Pregnancy: G2P1001 at [redacted]w[redacted]d 1. Encounter for supervision of low-risk pregnancy in third trimester Continue routine prenatal care.   Term labor symptoms and general obstetric precautions including but not limited to vaginal bleeding, contractions, leaking of fluid and fetal movement were reviewed in detail with the patient. Please refer to After Visit Summary for other counseling recommendations.   Return in 1 week (on 11/10/2020).  Future Appointments   Date Time Provider Department Center  11/09/2020  2:15 PM Madlyn Frankel [facility]    Reva Bores, MD

## 2020-11-09 ENCOUNTER — Ambulatory Visit (INDEPENDENT_AMBULATORY_CARE_PROVIDER_SITE_OTHER): Payer: Medicaid Other | Admitting: Student

## 2020-11-09 ENCOUNTER — Other Ambulatory Visit: Payer: Self-pay

## 2020-11-09 VITALS — BP 115/69 | HR 97 | Wt 179.2 lb

## 2020-11-09 DIAGNOSIS — Z3A39 39 weeks gestation of pregnancy: Secondary | ICD-10-CM

## 2020-11-09 DIAGNOSIS — Z3493 Encounter for supervision of normal pregnancy, unspecified, third trimester: Secondary | ICD-10-CM

## 2020-11-09 NOTE — Progress Notes (Signed)
Patient stated that "baby use to move a lot but only little"

## 2020-11-09 NOTE — Progress Notes (Signed)
   PRENATAL VISIT NOTE  Subjective:  Christie Parker is a 25 y.o. G2P1001 at [redacted]w[redacted]d being seen today for ongoing prenatal care.  She is currently monitored for the following issues for this low-risk pregnancy and has History of prior pregnancy with IUGR newborn; History of pre-eclampsia; Supervision of low-risk pregnancy; and Echogenic intracardiac focus of fetus on prenatal ultrasound on their problem list.  Patient reports no complaints.   .  .  Movement: (!) Decreased. Denies leaking of fluid.   The following portions of the patient's history were reviewed and updated as appropriate: allergies, current medications, past family history, past medical history, past social history, past surgical history and problem list.   Objective:   Vitals:   11/09/20 1452  BP: 115/69  Pulse: 97  Weight: 179 lb 3.2 oz (81.3 kg)    Fetal Status: Fetal Heart Rate (bpm): 156 Fundal Height: 41 cm Movement: (!) Decreased     General:  Alert, oriented and cooperative. Patient is in no acute distress.  Skin: Skin is warm and dry. No rash noted.   Cardiovascular: Normal heart rate noted  Respiratory: Normal respiratory effort, no problems with respiration noted  Abdomen: Soft, gravid, appropriate for gestational age.  Pain/Pressure: Present     Pelvic: Cervical exam deferred        Extremities: Normal range of motion.  Edema: None  Mental Status: Normal mood and affect. Normal behavior. Normal judgment and thought content.   Assessment and Plan:  Pregnancy: G2P1001 at [redacted]w[redacted]d 1. Encounter for supervision of low-risk pregnancy in third trimester -patient doing well. When discussing fetal movements, she reports that the baby's movements are still active but not as strong. She is not worried about movements today. She reports that baby normally moves at night and last nigth he was very active. Encouraged mom to come to MAU if she does not feel active movements tonight. Reviewed fetal movements and how to  count -declines cervical exam; she states that her child is in the car crying and she wants to leave (child is with her husband) -will schedule IOL for Oct 18 -BP normal today; she is asymptomatic  Term labor symptoms and general obstetric precautions including but not limited to vaginal bleeding, contractions, leaking of fluid and fetal movement were reviewed in detail with the patient. Please refer to After Visit Summary for other counseling recommendations.   Return around 12-14 for NST/AFI with diane or carrie.  Future Appointments  Date Time Provider Department Center  11/20/2020  1:15 PM [place] [facility]    Marylene Land, [place]

## 2020-11-10 ENCOUNTER — Other Ambulatory Visit: Payer: Self-pay | Admitting: Student

## 2020-11-11 ENCOUNTER — Inpatient Hospital Stay (HOSPITAL_COMMUNITY)
Admission: AD | Admit: 2020-11-11 | Discharge: 2020-11-13 | DRG: 807 | Disposition: A | Discharge status: Home / Self Care | Payer: Medicaid Other | Attending: Obstetrics and Gynecology | Admitting: Obstetrics and Gynecology

## 2020-11-11 ENCOUNTER — Inpatient Hospital Stay (HOSPITAL_COMMUNITY): Payer: Medicaid Other | Admitting: Anesthesiology

## 2020-11-11 ENCOUNTER — Encounter (HOSPITAL_COMMUNITY): Payer: Self-pay | Admitting: Obstetrics and Gynecology

## 2020-11-11 ENCOUNTER — Other Ambulatory Visit: Payer: Self-pay

## 2020-11-11 DIAGNOSIS — Z8759 Personal history of other complications of pregnancy, childbirth and the puerperium: Secondary | ICD-10-CM

## 2020-11-11 DIAGNOSIS — Z3A39 39 weeks gestation of pregnancy: Secondary | ICD-10-CM | POA: Diagnosis not present

## 2020-11-11 DIAGNOSIS — Z20822 Contact with and (suspected) exposure to covid-19: Secondary | ICD-10-CM | POA: Diagnosis present

## 2020-11-11 DIAGNOSIS — O134 Gestational [pregnancy-induced] hypertension without significant proteinuria, complicating childbirth: Secondary | ICD-10-CM | POA: Diagnosis not present

## 2020-11-11 DIAGNOSIS — Z3493 Encounter for supervision of normal pregnancy, unspecified, third trimester: Secondary | ICD-10-CM

## 2020-11-11 DIAGNOSIS — O26893 Other specified pregnancy related conditions, third trimester: Secondary | ICD-10-CM | POA: Diagnosis not present

## 2020-11-11 LAB — RPR: RPR Ser Ql: NONREACTIVE

## 2020-11-11 LAB — CBC
HCT: 38.9 % (ref 36.0–46.0)
Hemoglobin: 13.2 g/dL (ref 12.0–15.0)
MCH: 30.3 pg (ref 26.0–34.0)
MCHC: 33.9 g/dL (ref 30.0–36.0)
MCV: 89.4 fL (ref 80.0–100.0)
Platelets: 258 10*3/uL (ref 150–400)
RBC: 4.35 MIL/uL (ref 3.87–5.11)
RDW: 12.3 % (ref 11.5–15.5)
WBC: 8.7 10*3/uL (ref 4.0–10.5)
nRBC: 0 % (ref 0.0–0.2)

## 2020-11-11 LAB — RESP PANEL BY RT-PCR (FLU A&B, COVID) ARPGX2
Influenza A by PCR: NEGATIVE
Influenza B by PCR: NEGATIVE
SARS Coronavirus 2 by RT PCR: NEGATIVE

## 2020-11-11 LAB — TYPE AND SCREEN
ABO/RH(D): B POS
Antibody Screen: NEGATIVE

## 2020-11-11 MED ORDER — OXYCODONE-ACETAMINOPHEN 5-325 MG PO TABS: 2.0000 | ORAL_TABLET | ORAL | Status: DC | PRN

## 2020-11-11 MED ORDER — EPHEDRINE 5 MG/ML INJ: 10.0000 mg | INTRAVENOUS | Status: DC | PRN

## 2020-11-11 MED ORDER — SOD CITRATE-CITRIC ACID 500-334 MG/5ML PO SOLN: 30.0000 mL | ORAL | Status: DC | PRN

## 2020-11-11 MED ORDER — LACTATED RINGERS IV SOLN
500.0000 mL | Freq: Once | INTRAVENOUS | Status: AC
  Administered 2020-11-11: 500 mL via INTRAVENOUS

## 2020-11-11 MED ORDER — TERBUTALINE SULFATE 1 MG/ML IJ SOLN
0.2500 mg | Freq: Once | INTRAMUSCULAR | Status: DC | PRN
  Filled 2020-11-11: qty 1

## 2020-11-11 MED ORDER — ONDANSETRON HCL 4 MG/2ML IJ SOLN
4.0000 mg | Freq: Four times a day (QID) | INTRAMUSCULAR | Status: DC | PRN
Start: 2020-11-11 — End: 2020-11-12
  Administered 2020-11-12: 4 mg via INTRAVENOUS
  Filled 2020-11-11: qty 2

## 2020-11-11 MED ORDER — LACTATED RINGERS AMNIOINFUSION: INTRAVENOUS | Status: DC

## 2020-11-11 MED ORDER — OXYTOCIN BOLUS FROM INFUSION: 333.0000 mL | Freq: Once | INTRAVENOUS | Status: DC

## 2020-11-11 MED ORDER — OXYCODONE-ACETAMINOPHEN 5-325 MG PO TABS: 1.0000 | ORAL_TABLET | ORAL | Status: DC | PRN

## 2020-11-11 MED ORDER — ONDANSETRON HCL 4 MG/2ML IJ SOLN: 4.0000 mg | Freq: Four times a day (QID) | INTRAMUSCULAR | Status: DC | PRN

## 2020-11-11 MED ORDER — LIDOCAINE HCL (PF) 1 % IJ SOLN: 30.0000 mL | INTRAMUSCULAR | Status: DC | PRN

## 2020-11-11 MED ORDER — PHENYLEPHRINE 40 MCG/ML (10ML) SYRINGE FOR IV PUSH (FOR BLOOD PRESSURE SUPPORT)
80.0000 ug | PREFILLED_SYRINGE | INTRAVENOUS | Status: DC | PRN
  Filled 2020-11-11: qty 10

## 2020-11-11 MED ORDER — ACETAMINOPHEN 500 MG PO TABS
1000.0000 mg | ORAL_TABLET | Freq: Four times a day (QID) | ORAL | Status: DC | PRN
  Administered 2020-11-11 – 2020-11-12 (×2): 1000 mg via ORAL
  Filled 2020-11-11 (×2): qty 2

## 2020-11-11 MED ORDER — OXYTOCIN-SODIUM CHLORIDE 30-0.9 UT/500ML-% IV SOLN
2.5000 [IU]/h | INTRAVENOUS | Status: DC
  Administered 2020-11-12: 2.5 [IU]/h via INTRAVENOUS
  Filled 2020-11-11: qty 500

## 2020-11-11 MED ORDER — LACTATED RINGERS IV SOLN
500.0000 mL | INTRAVENOUS | Status: DC | PRN
  Administered 2020-11-11 (×2): 500 mL via INTRAVENOUS

## 2020-11-11 MED ORDER — LACTATED RINGERS IV SOLN: INTRAVENOUS | Status: DC

## 2020-11-11 MED ORDER — LIDOCAINE HCL (PF) 1 % IJ SOLN
INTRAMUSCULAR | Status: DC | PRN
  Administered 2020-11-11: 11 mL via EPIDURAL

## 2020-11-11 MED ORDER — LACTATED RINGERS IV SOLN: 500.0000 mL | INTRAVENOUS | Status: DC | PRN

## 2020-11-11 MED ORDER — OXYTOCIN-SODIUM CHLORIDE 30-0.9 UT/500ML-% IV SOLN
1.0000 m[IU]/min | INTRAVENOUS | Status: DC
  Administered 2020-11-11: 2 m[IU]/min via INTRAVENOUS

## 2020-11-11 MED ORDER — ACETAMINOPHEN 325 MG PO TABS: 650.0000 mg | ORAL_TABLET | ORAL | Status: DC | PRN

## 2020-11-11 MED ORDER — FENTANYL-BUPIVACAINE-NACL 0.5-0.125-0.9 MG/250ML-% EP SOLN
EPIDURAL | Status: AC
  Filled 2020-11-11: qty 250

## 2020-11-11 MED ORDER — FENTANYL-BUPIVACAINE-NACL 0.5-0.125-0.9 MG/250ML-% EP SOLN
12.0000 mL/h | EPIDURAL | Status: DC | PRN
  Administered 2020-11-11: 12 mL/h via EPIDURAL

## 2020-11-11 MED ORDER — TERBUTALINE SULFATE 1 MG/ML IJ SOLN: 0.2500 mg | Freq: Once | INTRAMUSCULAR | Status: DC | PRN

## 2020-11-11 MED ORDER — PHENYLEPHRINE 40 MCG/ML (10ML) SYRINGE FOR IV PUSH (FOR BLOOD PRESSURE SUPPORT)
80.0000 ug | PREFILLED_SYRINGE | INTRAVENOUS | Status: AC | PRN
  Administered 2020-11-11 (×3): 80 ug via INTRAVENOUS

## 2020-11-11 MED ORDER — LEVONORGESTREL 20.1 MCG/DAY IU IUD
INTRAUTERINE_SYSTEM | INTRAUTERINE | Status: AC
  Filled 2020-11-11: qty 1

## 2020-11-11 MED ORDER — OXYTOCIN-SODIUM CHLORIDE 30-0.9 UT/500ML-% IV SOLN: 2.5000 [IU]/h | INTRAVENOUS | Status: DC

## 2020-11-11 MED ORDER — FENTANYL CITRATE (PF) 100 MCG/2ML IJ SOLN
100.0000 ug | INTRAMUSCULAR | Status: DC | PRN
  Administered 2020-11-11 (×2): 100 ug via INTRAVENOUS
  Filled 2020-11-11 (×2): qty 2

## 2020-11-11 MED ORDER — OXYTOCIN BOLUS FROM INFUSION
333.0000 mL | Freq: Once | INTRAVENOUS | Status: AC
  Administered 2020-11-12: 333 mL via INTRAVENOUS

## 2020-11-11 MED ORDER — DIPHENHYDRAMINE HCL 50 MG/ML IJ SOLN: 12.5000 mg | INTRAMUSCULAR | Status: DC | PRN

## 2020-11-11 NOTE — Progress Notes (Signed)
Raquell Mccombs is a 25 y.o. G2P1001 at [redacted]w[redacted]d.  Subjective: Pt called out reporting more rectal pain.  Objective: BP 124/80   Pulse (!) 111   Temp 99 F (37.2 C) (Oral)   Resp 18   Ht 5\' 2"  (1.575 m)   Wt 81.4 kg   LMP 02/19/2020 (Exact Date)   SpO2 98%   BMI 32.83 kg/m    FHT:  FHR: 155 bpm, variability: mod,  accelerations:  15x15,  decelerations:  variables, not as repetitive but few deep variables particularly after position change. Pt back to sitting upright. Fewer variables.  UC:   Q 2-3 minutes, strong. MVU's adequate Dilation: 9 Effacement (%): 90 Cervical Position: Middle Station: Plus 1 Presentation: Vertex Exam by:: v. Lamari Beckles, cnm  Labs: NA  Assessment / Plan: [redacted]w[redacted]d week IUP Labor: transition. Cervix thin, reducible. Baby lower in pelvis. Attempted one practice push. Cervix not staying back. Will stop pushing.  Fetal Wellbeing:  Category II, overall reassuring. Dr. [redacted]w[redacted]d reviewed.  Pain Control:  Epidural Anticipated MOD:  Uncertain. More hopeful for vaginal birth with better descent.   Para March, Katrinka Blazing, CNM 11/11/2020 11:47 PM

## 2020-11-11 NOTE — Progress Notes (Signed)
Patient ID: Christie Parker, female   DOB: 12/26/1995, 25 y.o.   MRN: [id number]  Labor Progress Note Christie Parker is a 25 y.o. G2P1001 at [redacted]w[redacted]d presented for SOL with intact membranes.  S:  Patient has been resting with position changes, feeling very frustrated with lack of progress and worried about her baby and possible Cesarean. Neighbor at bedside for support (FOB at home with their 73mo).  O:  BP (!) 116/57   Pulse (!) 115   Temp 99.6 F (37.6 C) (Oral)   Resp 18   Ht 5\' 2"  (1.575 m)   Wt 179 lb 8 oz (81.4 kg)   LMP 02/19/2020 (Exact Date)   SpO2 100%   BMI 32.83 kg/m  EFM: baseline 150 bpm/ moderate (with short periods of variability)/ 15x15 accels/ recurrent early decels, some deep and 02/21/2020 long but always recover quickly with a rebound acceleration. MD aware of fetal status. Toco/IUPC: q2-78min SVE: Dilation: 8 Effacement (%): 90 Cervical Position: Middle Station: -1 Presentation: Vertex Exam by:: [email or web address], CNM Pitocin: attempted 2x2 pitocin titration, lasted Tyler Aas before baby began having deeper decelerations with every contraction. Pit turned off.   A/P: 25 y.o. G2P1001 [redacted]w[redacted]d  1. Labor: protracted active labor phase, attempted  2. Pain: Epidural in place 3. Spoke with patient about need to balance strengthening contractions and what the baby will tolerate. Gave general encouragement about either kind of birth, but did caution that we may end up needing a surgical delivery. Patient tearful but understanding, mostly concerned about support at home if she has surgery since her husband is in a PhD program and not likely to take time off. RN at bedside also giving support and expectations of healing if a surgical birth is needed. Plan to let patient continue to labor without pitocin and recheck in two hours.   Cautiously anticipate SVD, but also aware CS may be needed due to fetal intolerance of labor and arrest of descent.  [redacted]w[redacted]d, CNM, MSN, IBCLC Certified Nurse Midwife,  [facility]

## 2020-11-11 NOTE — Progress Notes (Signed)
Labor Progress Note Christie Parker is a 25 y.o. G2P1001 at [redacted]w[redacted]d presented for SOL with intact membranes.  S:  Called to room by RN, pt requesting AROM to "speed things along." While discussing the procedure, pt verbalized several times "I can't take more pain" but also expressed fear of the epidural because her last baby had a very low decel after the epidural was placed (into the 60s per pt). With clarifying questions, pt able to decide on epidural placement prior to AROM.  O:  BP 101/69   Pulse (!) 110   Temp 98.6 F (37 C) (Oral)   Resp 18   Ht 5\' 2"  (1.575 m)   Wt 179 lb 8 oz (81.4 kg)   LMP 02/19/2020 (Exact Date)   SpO2 100%   BMI 32.83 kg/m  EFM: baseline 145 bpm/ moderate variability/ 15x15 accels/ no decels  Toco/IUPC: q84min SVE: Dilation: 7 Effacement (%): 90 Cervical Position: Middle Station: -2 Presentation: Vertex Exam by:: [email or web address], RN  A/P: 25 y.o. G2P1001 [redacted]w[redacted]d  1. Labor: Active labor 2. FWB: Cat 1 3. Pain: Planning epidural 4. Labor: continue expectant management, will evaluate for AROM after epidural placement.  Anticipate SVD.  [redacted]w[redacted]d, CNM 3:49 PM

## 2020-11-11 NOTE — MAU Note (Signed)
Presents stating she's having ctxs 3-5 minutes apart that began @ 0600 this morning.  Denies VB, endorses pink spotting.  Denies LOF.  Reports +FM.

## 2020-11-11 NOTE — Progress Notes (Signed)
Patient ID: [name], female   DOB: January 24, 1996, 25 y.o.   MRN: [id number]  Labor Progress Note Christie Parker is a 25 y.o. G2P1001 at [redacted]w[redacted]d presented for SOL with intact membranes.  S:  Patient has been resting with position changes since epidural and then AROM placement. Neighbor at bedside for support (FOB at home with their 69mo).  O:  BP (!) 103/50   Pulse (!) 116   Temp 98.6 F (37 C) (Oral)   Resp 16   Ht 5\' 2"  (1.575 m)   Wt 179 lb 8 oz (81.4 kg)   LMP 02/19/2020 (Exact Date)   SpO2 100%   BMI 32.83 kg/m  EFM: baseline 150 bpm/ moderate variability/ 15x15 accels/ recurrent early decels Toco/IUPC: q2-37min SVE: Dilation: 7 Effacement (%): 90 Cervical Position: Middle Station: -2 Presentation: Vertex Exam by:: [email or web address], RN  A/P: 25 y.o. G2P1001 [redacted]w[redacted]d  1. Labor: Active labor - AROM completed with clear fluid return, pt repositioned into throne then right lateral with peanut ball then exaggerated Sims with peanut ball. Will watch FHT, can place FSE and IUPC if needed.  3. Pain: Epidural in place  Anticipate SVD.  [redacted]w[redacted]d, CNM, MSN, IBCLC Certified Nurse Midwife, [facility]

## 2020-11-11 NOTE — Progress Notes (Signed)
Patient ID: Markeshia Giebel, female   DOB: Nov 10, 1995, 25 y.o.   MRN: [id number]  Labor Progress Note Carlea Badour is a 25 y.o. G2P1001 at [redacted]w[redacted]d presented for SOL with intact membranes.  S:  Called to room by RN for fetal deceleration into 80s shortly after epidural placement. Pt tolerated hypotensive event without complaints of dizziness/nausea, just anxiety over low heart rate.   O:  BP (!) 103/50   Pulse (!) 116   Temp 98.6 F (37 C) (Oral)   Resp 16   Ht 5\' 2"  (1.575 m)   Wt 179 lb 8 oz (81.4 kg)   LMP 02/19/2020 (Exact Date)   SpO2 100%   BMI 32.83 kg/m  EFM: baseline 145 bpm/ moderate variability/ 15x15 accels/ recurrent deep decels, one prolonged for 02/21/2020 in 80s with good recovery and baseline of 150 Toco/IUPC: q100min SVE: Dilation: 7 Effacement (%): 90 Cervical Position: Middle Station: -2 Presentation: Vertex Exam by:: [email or web address], RN  A/P: 25 y.o. G2P1001 [redacted]w[redacted]d  1. Labor: Active labor will continue to manage expectantly, will reassess for AROM after some recovery time for baby and pt wanting to nap. 2. FWB: Cat 2 but reassuring, MD aware 3. Pain: Epidural in place  Anticipate SVD.  [redacted]w[redacted]d, CNM, MSN, IBCLC Certified Nurse Midwife, [facility]

## 2020-11-11 NOTE — Anesthesia Procedure Notes (Signed)
Epidural Patient location during procedure: OB Start time: 11/11/2020 12:00 PM End time: 11/11/2020 12:18 PM  Staffing Anesthesiologist: Lowella Curb, MD Performed: anesthesiologist   Preanesthetic Checklist Completed: patient identified, IV checked, site marked, risks and benefits discussed, surgical consent, monitors and equipment checked, pre-op evaluation and timeout performed  Epidural Patient position: sitting Prep: ChloraPrep Patient monitoring: heart rate, cardiac monitor, continuous pulse ox and blood pressure Approach: midline Location: L2-L3 Injection technique: LOR saline  Needle:  Needle type: Tuohy  Needle gauge: 17 G Needle length: 9 cm Needle insertion depth: 5 cm Catheter type: closed end flexible Catheter size: 20 Guage Catheter at skin depth: 9 cm Test dose: negative  Assessment Events: blood not aspirated, injection not painful, no injection resistance, no paresthesia and negative IV test  Additional Notes Reason for block:procedure for pain

## 2020-11-11 NOTE — Anesthesia Preprocedure Evaluation (Signed)
Anesthesia Evaluation  Patient identified by MRN, date of birth, ID band Patient awake    Reviewed: Allergy & Precautions, H&P , NPO status , Patient's Chart, lab work & pertinent test results  History of Anesthesia Complications Negative for: history of anesthetic complications  Airway Mallampati: II  TM Distance: >3 FB Neck ROM: full    Dental no notable dental hx.    Pulmonary neg pulmonary ROS,    Pulmonary exam normal        Cardiovascular hypertension, Normal cardiovascular exam Rhythm:regular Rate:Normal     Neuro/Psych negative neurological ROS  negative psych ROS   GI/Hepatic negative GI ROS, Neg liver ROS,   Endo/Other  negative endocrine ROS  Renal/GU negative Renal ROS  negative genitourinary   Musculoskeletal   Abdominal   Peds  Hematology negative hematology ROS (+)   Anesthesia Other Findings   Reproductive/Obstetrics (+) Pregnancy                             Anesthesia Physical  Anesthesia Plan  ASA: III  Anesthesia Plan: Epidural   Post-op Pain Management:    Induction:   PONV Risk Score and Plan:   Airway Management Planned:   Additional Equipment:   Intra-op Plan:   Post-operative Plan:   Informed Consent: I have reviewed the patients History and Physical, chart, labs and discussed the procedure including the risks, benefits and alternatives for the proposed anesthesia with the patient or authorized representative who has indicated his/her understanding and acceptance.       Plan Discussed with:   Anesthesia Plan Comments:         Anesthesia Quick Evaluation

## 2020-11-11 NOTE — Progress Notes (Signed)
Christie Parker is a 25 y.o. G2P1001 at [redacted]w[redacted]d.  Subjective: Moderate rectal pressure w/ UC's.   Objective: BP 118/69   Pulse (!) 115   Temp 99.4 F (37.4 C) (Oral)   Resp 19   Ht 5\' 2"  (1.575 m)   Wt 81.4 kg   LMP 02/19/2020 (Exact Date)   SpO2 98%   BMI 32.83 kg/m    FHT:  FHR: 160 bpm, variability: min-mod,  accelerations:  15x15,  decelerations: periods of recurrent variables UC:   Q 2-5 minutes, MVU's adequate x 2 hours.  Dilation: 9 Effacement (%): 90 Cervical Position: Middle Station: 0 Presentation: Vertex Exam by:: [email or web address], CNM  Labs: Results for orders placed or performed during the hospital encounter of 11/11/20 (from the past 24 hour(s))  Resp Panel by RT-PCR (Flu A&B, Covid) Nasopharyngeal Swab     Status: None   Collection Time: 11/11/20  8:45 AM   Specimen: Nasopharyngeal Swab; Nasopharyngeal(NP) swabs in vial transport medium  Result Value Ref Range   SARS Coronavirus 2 by RT PCR NEGATIVE NEGATIVE   Influenza A by PCR NEGATIVE NEGATIVE   Influenza B by PCR NEGATIVE NEGATIVE  Type and screen [facility]     Status: None   Collection Time: 11/11/20  8:55 AM  Result Value Ref Range   ABO/RH(D) B POS    Antibody Screen NEG    Sample Expiration      11/14/2020,2359 Performed at [facility], [address] [place], [place]   CBC     Status: None   Collection Time: 11/11/20  8:56 AM  Result Value Ref Range   WBC 8.7 4.0 - 10.5 K/uL   RBC 4.35 3.87 - 5.11 MIL/uL   Hemoglobin 13.2 12.0 - 15.0 g/dL   HCT 01/11/21 60.6 - 30.1 %   MCV 89.4 80.0 - 100.0 fL   MCH 30.3 26.0 - 34.0 pg   MCHC 33.9 30.0 - 36.0 g/dL   RDW 60.1 09.3 - 23.5 %   Platelets 258 150 - 400 K/uL   nRBC 0.0 0.0 - 0.2 %  RPR     Status: None   Collection Time: 11/11/20  8:56 AM  Result Value Ref Range   RPR Ser Ql NON REACTIVE NON REACTIVE    Assessment / Plan: [redacted]w[redacted]d week IUP Labor: Transition. Some progress. MVU's now adequate. Pt strongly desires  to continue attempting vaginal birth. Discussed w/ Dr. [redacted]w[redacted]d. OK  to continue laboring. Will CTO labor progress and FHR closely.  Fetal Wellbeing:  Category II, overall reassuring  Pain Control:  Epidural Anticipated MOD:  Uncertain. Discussed that C/S may be necessary if she does not progress and/or FHR worsens.   Para March, Katrinka Blazing, CNM 11/11/2020 10:50 PM

## 2020-11-11 NOTE — H&P (Signed)
Christie Parker is a 25 y.o. G59P1001 female at [redacted]w[redacted]d presenting for SOL. Received prenatal care at [facility], prenatal records fully reviewed. Obstetric history significant for history of preeclampsia with IUGR, this pregnancy uncomplicated. Last U/S showed fetal growth at 59th%ile on 10/02/20. OB History     Gravida  2   Para  1   Term  1   Preterm      AB      Living  1      SAB      IAB      Ectopic      Multiple  0   Live Births  1          Past Medical History:  Diagnosis Date   Gestational hypertension    Past Surgical History:  Procedure Laterality Date   NO PAST SURGERIES     Family History: family history includes Hypertension in her mother. Social History:  reports that she has never smoked. She has never used smokeless tobacco. She reports that she does not drink alcohol and does not use drugs.    Maternal Diabetes: No Genetic Screening: Normal Maternal Ultrasounds/Referrals: Normal Fetal Ultrasounds or other Referrals:  None Maternal Substance Abuse:  No Significant Maternal Medications:  None Significant Maternal Lab Results:  Group B Strep negative Other Comments:  None  Pertinent items noted in HPI and remainder of comprehensive ROS otherwise negative.   Maternal Medical History:  Reason for admission: Contractions.   Contractions: Onset was 3-5 hours ago.   Frequency: regular.   Perceived severity is strong.   Fetal activity: Perceived fetal activity is normal.   Last perceived fetal movement was within the past hour.   Prenatal complications: no prenatal complications Prenatal Complications - Diabetes: none.  Dilation: 6.5 Effacement (%): 90 Station: -2, -1 Exam by:: Verdie Drown, RN Blood pressure 110/78, pulse 97, temperature 98.6 F (37 C), temperature source Oral, resp. rate 18, height 5\' 2"  (1.575 m), weight 179 lb 8 oz (81.4 kg), last menstrual period 02/19/2020, SpO2 99 %, currently breastfeeding. Maternal Exam:  Uterine  Assessment: Contraction strength is firm.  Contraction duration is 60 seconds. Contraction frequency is regular.  Abdomen: Patient reports no abdominal tenderness. Estimated fetal weight is 59th%ile on 10/02/20.   Fetal presentation: vertex Introitus: Normal vulva. Normal vagina.  Pelvis: adequate for delivery.   Cervix: Cervix evaluated by digital exam.     Fetal Exam Fetal Monitor Review: Mode: ultrasound.   Baseline rate: 145.  Variability: moderate (6-25 bpm).   Pattern: accelerations present and no decelerations.   Fetal State Assessment: Category I - tracings are normal.  Physical Exam Vitals and nursing note reviewed.  Constitutional:      Appearance: Normal appearance.  HENT:     Head: Normocephalic.     Mouth/Throat:     Mouth: Mucous membranes are moist.  Eyes:     Conjunctiva/sclera: Conjunctivae normal.     Pupils: Pupils are equal, round, and reactive to light.  Cardiovascular:     Rate and Rhythm: Normal rate and regular rhythm.     Pulses: Normal pulses.  Pulmonary:     Effort: Pulmonary effort is normal.  Genitourinary:    General: Normal vulva.  Musculoskeletal:        General: Normal range of motion.     Cervical back: Normal range of motion.  Skin:    General: Skin is warm.     Capillary Refill: Capillary refill takes less than 2 seconds.  Neurological:  Mental Status: She is alert and oriented to person, place, and time.  Psychiatric:        Mood and Affect: Mood normal.        Behavior: Behavior normal.        Thought Content: Thought content normal.        Judgment: Judgment normal.    Prenatal labs: ABO, Rh: --/--/B POS (10/08 0855) Antibody: NEG (10/08 0855) Rubella: 1.16 (04/14 1214) RPR: NON REACTIVE (10/08 0856)  HBsAg: Negative (04/14 1214)  HIV: Non Reactive (07/14 0858)  GBS: Negative/-- (09/16 1001)   Assessment/Plan: G2P1001 at [redacted]w[redacted]d admitted to L&D for active labor Expectant management, pt can have epidural on request MOC:  ppIUD (Liletta at bedside for delivery) MOF: Breastfeeding  Bernerd Limbo 11/11/2020, 11:53 AM

## 2020-11-12 ENCOUNTER — Encounter (HOSPITAL_COMMUNITY): Payer: Self-pay | Admitting: Student

## 2020-11-12 DIAGNOSIS — Z3A39 39 weeks gestation of pregnancy: Secondary | ICD-10-CM

## 2020-11-12 MED ORDER — ONDANSETRON HCL 4 MG/2ML IJ SOLN: 4.0000 mg | INTRAMUSCULAR | Status: DC | PRN

## 2020-11-12 MED ORDER — TETANUS-DIPHTH-ACELL PERTUSSIS 5-2.5-18.5 LF-MCG/0.5 IM SUSY: 0.5000 mL | PREFILLED_SYRINGE | Freq: Once | INTRAMUSCULAR | Status: DC

## 2020-11-12 MED ORDER — SIMETHICONE 80 MG PO CHEW: 80.0000 mg | CHEWABLE_TABLET | ORAL | Status: DC | PRN

## 2020-11-12 MED ORDER — PRENATAL MULTIVITAMIN CH
1.0000 | ORAL_TABLET | Freq: Every day | ORAL | Status: DC
  Administered 2020-11-12 – 2020-11-13 (×2): 1 via ORAL
  Filled 2020-11-12 (×2): qty 1

## 2020-11-12 MED ORDER — BENZOCAINE-MENTHOL 20-0.5 % EX AERO: 1.0000 "application " | INHALATION_SPRAY | CUTANEOUS | Status: DC | PRN

## 2020-11-12 MED ORDER — DIBUCAINE (PERIANAL) 1 % EX OINT: 1.0000 "application " | TOPICAL_OINTMENT | CUTANEOUS | Status: DC | PRN

## 2020-11-12 MED ORDER — DIPHENHYDRAMINE HCL 25 MG PO CAPS: 25.0000 mg | ORAL_CAPSULE | Freq: Four times a day (QID) | ORAL | Status: DC | PRN

## 2020-11-12 MED ORDER — IBUPROFEN 600 MG PO TABS
600.0000 mg | ORAL_TABLET | Freq: Four times a day (QID) | ORAL | Status: DC
  Administered 2020-11-12 – 2020-11-13 (×5): 600 mg via ORAL
  Filled 2020-11-12 (×7): qty 1

## 2020-11-12 MED ORDER — WITCH HAZEL-GLYCERIN EX PADS: 1.0000 "application " | MEDICATED_PAD | CUTANEOUS | Status: DC | PRN

## 2020-11-12 MED ORDER — OXYCODONE-ACETAMINOPHEN 5-325 MG PO TABS: 2.0000 | ORAL_TABLET | ORAL | Status: DC | PRN

## 2020-11-12 MED ORDER — ACETAMINOPHEN 325 MG PO TABS
650.0000 mg | ORAL_TABLET | ORAL | Status: DC | PRN
  Administered 2020-11-12 – 2020-11-13 (×2): 650 mg via ORAL
  Filled 2020-11-12 (×2): qty 2

## 2020-11-12 MED ORDER — ONDANSETRON HCL 4 MG PO TABS: 4.0000 mg | ORAL_TABLET | ORAL | Status: DC | PRN

## 2020-11-12 MED ORDER — MAGNESIUM HYDROXIDE 400 MG/5ML PO SUSP: 30.0000 mL | ORAL | Status: DC | PRN

## 2020-11-12 MED ORDER — MEASLES, MUMPS & RUBELLA VAC IJ SOLR: 0.5000 mL | Freq: Once | INTRAMUSCULAR | Status: DC

## 2020-11-12 MED ORDER — COCONUT OIL OIL
1.0000 | TOPICAL_OIL | Status: DC | PRN
Start: 2020-11-12 — End: 2020-11-13

## 2020-11-12 MED ORDER — OXYCODONE-ACETAMINOPHEN 5-325 MG PO TABS
1.0000 | ORAL_TABLET | ORAL | Status: DC | PRN
Start: 2020-11-12 — End: 2020-11-13

## 2020-11-12 NOTE — Lactation Note (Signed)
This note was copied from a baby's chart. Lactation Consultation Note  Patient Name: Boy Jeyla Bulger VFIEP'P Date: 11/12/2020 Reason for consult: Initial assessment Age:25 hours  P2, Ex BF.  Mother is concerned about her milk supply. Reassured mother.  Baby sleeping.  Lactation to follow up later today.  Feed on demand with cues.  Goal 8-12+ times per day after first 24 hrs.  Place baby STS if not cueing. Mom made aware of O/P services, breastfeeding support groups, community resources, and our phone # for post-discharge questions.     Maternal Data Has patient been taught Hand Expression?: Yes Does the patient have breastfeeding experience prior to this delivery?: Yes How long did the patient breastfeed?: 1 year  Feeding Mother's Current Feeding Choice: Breast Milk  LATCH Score Latch: Grasps breast easily, tongue down, lips flanged, rhythmical sucking.  Audible Swallowing: A few with stimulation  Type of Nipple: Everted at rest and after stimulation  Comfort (Breast/Nipple): Soft / non-tender  Hold (Positioning): No assistance needed to correctly position infant at breast.  LATCH Score: 9   Interventions Interventions: Education;Breast feeding basics reviewed  Discharge    Consult Status Consult Status: Follow-up Date: 11/12/20 Follow-up type: In-patient    Dahlia Byes [facility] 11/12/2020, 9:37 AM

## 2020-11-12 NOTE — Anesthesia Postprocedure Evaluation (Signed)
Anesthesia Post Note  Patient: Christie Parker  Procedure(s) Performed: AN AD HOC LABOR EPIDURAL     Patient location during evaluation: Mother Baby Anesthesia Type: Epidural Level of consciousness: awake and alert Pain management: pain level controlled Vital Signs Assessment: post-procedure vital signs reviewed and stable Respiratory status: spontaneous breathing, nonlabored ventilation and respiratory function stable Cardiovascular status: stable Postop Assessment: no headache, no backache, epidural receding, no apparent nausea or vomiting, patient able to bend at knees, adequate PO intake and able to ambulate Anesthetic complications: no   No notable events documented.  Last Vitals:  Vitals:   11/12/20 1353 11/12/20 1813  BP: 116/78 117/71  Pulse: 62 95  Resp: 18 16  Temp: 36.7 C 37.1 C  SpO2: 100% 99%    Last Pain:  Vitals:   11/12/20 1813  TempSrc: Oral  PainSc: 0-No pain   Pain Goal:                Epidural/Spinal Function Cutaneous sensation: Normal sensation (11/12/20 1813), Patient able to flex knees: Yes (11/12/20 1813), Patient able to lift hips off bed: Yes (11/12/20 1813), Back pain beyond tenderness at insertion site: No (11/12/20 1813), Progressively worsening motor and/or sensory loss: No (11/12/20 1813), Bowel and/or bladder incontinence post epidural: No (11/12/20 1813)  Even Budlong Hristova

## 2020-11-12 NOTE — Lactation Note (Signed)
This note was copied from a baby's chart. Lactation Consultation Note  Patient Name: Christie Parker FIEPP'I Date: 11/12/2020 Reason for consult: Follow-up assessment;Mother's request;Difficult latch;Term;Other (Comment);Breastfeeding assistance (Gest HTN) Age:25 hours  Mom stated infant sleepy at breast. Mom given colostrum one hr prior.   LC assisted placing infant STS removing teashirt.  Infant latched in football with increase in depth of swallows noted with breast compression. Mom denied any pain with the latching. Mom some cramping with latching, we talked about asking for pain meds before a feeding. Mom using heat compress during the feedings.   All questions answered at end of the visit.    Maternal Data Has patient been taught Hand Expression?: Yes Does the patient have breastfeeding experience prior to this delivery?: Yes How long did the patient breastfeed?: 13 months  Feeding Mother's Current Feeding Choice: Breast Milk  LATCH Score Latch: Repeated attempts needed to sustain latch, nipple held in mouth throughout feeding, stimulation needed to elicit sucking reflex.  Audible Swallowing: Spontaneous and intermittent  Type of Nipple: Everted at rest and after stimulation  Comfort (Breast/Nipple): Soft / non-tender  Hold (Positioning): Assistance needed to correctly position infant at breast and maintain latch.  LATCH Score: 8   Lactation Tools Discussed/Used    Interventions Interventions: Breast feeding basics reviewed;Support pillows;Education;Assisted with latch;Position options;Skin to skin;Expressed milk;Breast massage;Hand express;Breast compression;Adjust position  Discharge    Consult Status Consult Status: Follow-up Date: 11/13/20 Follow-up type: In-patient    Oumou Smead  Nicholson-Springer 11/12/2020, 1:54 PM

## 2020-11-12 NOTE — Discharge Summary (Signed)
Postpartum Discharge Summary     Patient Name: Christie Parker DOB: 1995-03-15 MRN: [id number]  Date of admission: 11/11/2020 Delivery date:11/12/2020  Delivering provider: Manya Silvas  Date of discharge: 11/13/2020  Admitting diagnosis: Indication for care in labor and delivery, antepartum [O75.9] Intrauterine pregnancy: [redacted]w[redacted]d    Secondary diagnosis:  Principal Problem:   Vaginal delivery Active Problems:   History of prior pregnancy with IUGR newborn   History of pre-eclampsia  Additional problems: None    Discharge diagnosis: Term Pregnancy Delivered                                              Post partum procedures: None Augmentation: AROM and Pitocin. Low grade temp 100.2 in labor.  Complications: None  Hospital course: Onset of Labor With Vaginal Delivery      25y.o. yo G2P1001 at 316w5das admitted in Latent Labor on 11/11/2020. Patient had a labor course complicated complicated by protracted active phase and low grade temp of 100.2. Pt planned post-placental IUD, but decision was made to not place it due to temp. Plan to place it postpartum. Instructed pt to abstain until IUD is placed.   Membrane Rupture Time/Date: 2:12 PM ,11/11/2020   Delivery Method:Vaginal, Spontaneous  Episiotomy: None  Lacerations:  None  Patient had an uncomplicated postpartum course.  She had no additional fevers after delivery.  She is ambulating, tolerating a regular diet, passing flatus, and urinating well. Patient is discharged home in stable condition on 11/13/20.  Newborn Data: Birth date:11/12/2020  Birth time:2:30 AM  Gender:Female  Living status:Living  Apgars:9 ,9  Weight:3485 g   Magnesium Sulfate received: No BMZ received: No Rhophylac:N/A MMR:N/A T-DaP:Given prenatally Flu: Yes Transfusion:No  Physical exam  Vitals:   11/12/20 1008 11/12/20 1353 11/12/20 1813 11/13/20 0104  BP: (!) 115/58 116/78 117/71 121/75  Pulse: 85 62 95 87  Resp: _0 Temp: 98.3 F  (36.8 C) 98.1 F (36.7 C) 98.8 F (37.1 C) 98.7 F (37.1 C)  TempSrc: Oral Oral Oral Oral  SpO2: 99% 100% 99% 99%  Weight:      Height:       General: alert, cooperative, and no distress Lochia: appropriate Uterine Fundus: firm DVT Evaluation: no LE edema or calf tenderness to palpation   Labs: Lab Results  Component Value Date   WBC 8.7 11/11/2020   HGB 13.2 11/11/2020   HCT 38.9 11/11/2020   MCV 89.4 11/11/2020   PLT 258 11/11/2020   CMP Latest Ref Rng & Units 05/18/2020  Glucose 65 - 99 mg/dL 87  BUN 6 - 20 mg/dL 5(L)  Creatinine 0.57 - 1.00 mg/dL 0.51(L)  Sodium 134 - 144 mmol/L 135  Potassium 3.5 - 5.2 mmol/L 4.1  Chloride 96 - 106 mmol/L 101  CO2 20 - 29 mmol/L 19(L)  Calcium 8.7 - 10.2 mg/dL 9.2  Total Protein 6.0 - 8.5 g/dL 7.0  Total Bilirubin 0.0 - 1.2 mg/dL 0.4  Alkaline Phos 44 - 121 IU/L 60  AST 0 - 40 IU/L 12  ALT 0 - 32 IU/L 11   Edinburgh Score: Edinburgh Postnatal Depression Scale Screening Tool 09/14/2019  I have been able to laugh and see the funny side of things. 0  I have looked forward with enjoyment to things. 0  I have blamed myself unnecessarily when things went  wrong. 0  I have been anxious or worried for no good reason. 0  I have felt scared or panicky for no good reason. 0  Things have been getting on top of me. 0  I have been so unhappy that I have had difficulty sleeping. 0  I have felt sad or miserable. 0  I have been so unhappy that I have been crying. 0  The thought of harming myself has occurred to me. 0  Edinburgh Postnatal Depression Scale Total 0    After visit meds:  Allergies as of 11/13/2020   No Known Allergies      Medication List     TAKE these medications    acetaminophen 500 MG tablet Commonly known as: TYLENOL Take 2 tablets (1,000 mg total) by mouth every 8 (eight) hours as needed (pain).   ibuprofen 600 MG tablet Commonly known as: ADVIL Take 1 tablet (600 mg total) by mouth every 6 (six) hours as  needed (pain).   Prenatal Vitamin 27-0.8 MG Tabs Take 1 tablet by mouth daily.        Discharge home in stable condition Infant Feeding: Breast Infant Disposition: home with mother Discharge instruction: per After Visit Summary and Postpartum booklet. Activity: Advance as tolerated. Pelvic rest for 6 weeks.  Diet: routine diet Future Appointments: Future Appointments  Date Time Provider [place]  11/20/2020  1:15 PM [facility]   Follow up Visit:  Please schedule this patient for a In person postpartum visit in 4 weeks with the following provider: Any provider. Prefers female provider.  Additional Postpartum F/U: None   Low risk pregnancy complicated by:  Nothing Delivery mode:  Vaginal, Spontaneous  Anticipated Birth Control: Plans IUD  11/13/2020 Genia Del, MD

## 2020-11-13 MED ORDER — ACETAMINOPHEN 500 MG PO TABS
1000.0000 mg | ORAL_TABLET | Freq: Three times a day (TID) | ORAL | 0 refills | Status: DC | PRN
Start: 2020-11-13 — End: 2022-12-12

## 2020-11-13 MED ORDER — IBUPROFEN 600 MG PO TABS: 600.0000 mg | ORAL_TABLET | Freq: Four times a day (QID) | ORAL | 0 refills | Status: DC | PRN

## 2020-11-13 NOTE — Lactation Note (Signed)
This note was copied from a baby's chart. Lactation Consultation Note  Patient Name: Christie Parker Date: 11/13/2020 Reason for consult: Follow-up assessment Age:25 hours  LC in to room prior to discharge. Baby is waking up after circumcision. Mother reports good feedings and deny any pain/discomfort. Discussed normal behavior and patterns after 24h, voids and stools as signs good intake, pumping, clusterfeeding, skin to skin. Talked about milk coming into volume and managing engorgement.  Mother latched independently. Observed optimal deep latch, positioning and alignment. Reviewed ways to keep baby awake at breast. Mother plans to stop using formula as soon as her milk comes into volume.   Plan: 1-Aim for a deep, comfortable latch, breastfeeding on demand or 8-12 times in 24h period. 2-Hand express/pump as needed for supplementation 3-Encouraged maternal rest, hydration and food intake.   Contact LC as needed for feeds/support/concerns/questions. All questions answered at this time. Reviewed LC brochure.     Feeding Mother's Current Feeding Choice: Breast Milk and Formula  LATCH Score Latch: Grasps breast easily, tongue down, lips flanged, rhythmical sucking.  Audible Swallowing: Spontaneous and intermittent  Type of Nipple: Everted at rest and after stimulation  Comfort (Breast/Nipple): Soft / non-tender  Hold (Positioning): No assistance needed to correctly position infant at breast.  LATCH Score: 10  Lactation Tools Discussed/Used Tools: Pump Breast pump type: Double-Electric Breast Pump;Manual  Interventions Interventions: Breast feeding basics reviewed;Assisted with latch;Education;LC Services brochure;Expressed milk  Discharge Discharge Education: Engorgement and breast care;Warning signs for feeding baby WIC Program: Yes  Consult Status Consult Status: Complete Date: 11/13/20 Follow-up type: Call as needed    Christie Parker 11/13/2020,  1:44 PM

## 2020-11-20 ENCOUNTER — Other Ambulatory Visit: Payer: Medicaid Other

## 2020-11-21 ENCOUNTER — Inpatient Hospital Stay (HOSPITAL_COMMUNITY): Payer: Medicaid Other

## 2020-11-21 ENCOUNTER — Inpatient Hospital Stay (HOSPITAL_COMMUNITY)
Admission: AD | Admit: 2020-11-21 | Payer: Medicaid Other | Source: Home / Self Care | Admitting: Obstetrics & Gynecology

## 2020-11-22 ENCOUNTER — Telehealth (HOSPITAL_COMMUNITY): Payer: Self-pay | Admitting: *Deleted

## 2020-11-22 NOTE — Telephone Encounter (Signed)
Attempted hospital discharge follow-up call with Language Line interpreter. No answer received. Deforest Hoyles, RN, 11/22/20, [phone]

## 2020-12-14 ENCOUNTER — Other Ambulatory Visit: Payer: Self-pay

## 2020-12-14 ENCOUNTER — Encounter: Payer: Self-pay | Admitting: Family Medicine

## 2020-12-14 ENCOUNTER — Ambulatory Visit (INDEPENDENT_AMBULATORY_CARE_PROVIDER_SITE_OTHER): Payer: Medicaid Other | Admitting: Family Medicine

## 2020-12-14 VITALS — BP 120/64 | HR 81 | Wt 163.7 lb

## 2020-12-14 DIAGNOSIS — Z30430 Encounter for insertion of intrauterine contraceptive device: Secondary | ICD-10-CM | POA: Diagnosis not present

## 2020-12-14 DIAGNOSIS — Z23 Encounter for immunization: Secondary | ICD-10-CM

## 2020-12-14 DIAGNOSIS — Z3202 Encounter for pregnancy test, result negative: Secondary | ICD-10-CM

## 2020-12-14 LAB — POCT PREGNANCY, URINE: Preg Test, Ur: NEGATIVE

## 2020-12-14 MED ORDER — LEVONORGESTREL 20.1 MCG/DAY IU IUD
1.0000 | INTRAUTERINE_SYSTEM | Freq: Once | INTRAUTERINE | Status: AC
  Administered 2020-12-14: 1 via INTRAUTERINE

## 2020-12-14 NOTE — Progress Notes (Signed)
Post Partum Visit Note  Christie Parker is a 25 y.o. [id number] female who presents for a postpartum visit. She is 4 weeks and 4 days postpartum following a normal spontaneous vaginal delivery.  I have fully reviewed the prenatal and intrapartum course. The delivery was at 39 gestational weeks.  Anesthesia: epidural. Postpartum course has been normal. Baby is doing well. Baby is feeding by breast. Bleeding thin lochia. Bowel function is normal. Bladder function is normal. Patient is not sexually active. Contraception method is none. Postpartum depression screening: negative.   The pregnancy intention screening data noted above was reviewed. Potential methods of contraception were discussed. The patient elected to proceed with No data recorded.   Edinburgh Postnatal Depression Scale - 12/14/20 1614       Edinburgh Postnatal Depression Scale:  In the Past 7 Days   I have been able to laugh and see the funny side of things. 0    I have looked forward with enjoyment to things. 0    I have blamed myself unnecessarily when things went wrong. 0    I have been anxious or worried for no good reason. 0    I have felt scared or panicky for no good reason. 0    Things have been getting on top of me. 0    I have been so unhappy that I have had difficulty sleeping. 0    I have felt sad or miserable. 0    I have been so unhappy that I have been crying. 0    The thought of harming myself has occurred to me. 0    Edinburgh Postnatal Depression Scale Total 0             Health Maintenance Due  Topic Date Due   COVID-19 Vaccine (1) Never done   HPV VACCINES (1 - 2-dose series) Never done   INFLUENZA VACCINE  09/04/2020    The following portions of the patient's history were reviewed and updated as appropriate: allergies, current medications, past family history, past medical history, past social history, past surgical history, and problem list.  Review of Systems Pertinent items noted in HPI and  remainder of comprehensive ROS otherwise negative.  Objective:  BP 120/64   Pulse 81   Wt 163 lb 11.2 oz (74.3 kg)   Breastfeeding Yes   BMI 29.94 kg/m    General:  alert, cooperative, and appears stated age   Breasts:  normal  Lungs: Normal effort  Heart:  regular rate and rhythm  Abdomen: soft, non-tender; bowel sounds normal; no masses,  no organomegaly   Procedure: Patient identified, informed consent performed, signed copy in chart, time out was performed.  Urine pregnancy test negative.  Speculum placed in the vagina.  Cervix visualized.  Cleaned with Betadine x 2.  Grasped anteriourly with a single tooth tenaculum.  Uterus sounded to 7 cm.  Liletta IUD placed per manufacturer's recommendations.  Strings trimmed to 3 cm.    Assessment:   Normal postpartum exam.   Plan:   Essential components of care per ACOG recommendations:  1.  Mood and well being: Patient with negative depression screening today. Reviewed local resources for support.  - Patient tobacco use? No.   - hx of drug use? No.    2. Infant care and feeding:  -Patient currently breastmilk feeding?  -Social determinants of health (SDOH) reviewed in EPIC. No concerns  3. Sexuality, contraception and birth spacing - Patient does not want a pregnancy in  the next year.  Desired family size is 3 children.  - Reviewed forms of contraception in tiered fashion. Patient desired IUD today.   - Discussed birth spacing of 18 months  4. Sleep and fatigue -Encouraged family/partner/community support of 4 hrs of uninterrupted sleep to help with mood and fatigue  5. Physical Recovery  - Discussed patients delivery and complications. She describes her labor as good. - Patient had a Vaginal, no problems at delivery. Patient had no laceration. Perineal healing reviewed. Patient expressed understanding - Patient has urinary incontinence? No. - Patient is safe to resume physical and sexual activity  6.  Health  Maintenance - HM due items addressed Yes - Last pap smear  Diagnosis  Date Value Ref Range Status  05/25/2020   Final   - Negative for intraepithelial lesion or malignancy (NILM)   Pap smear not done at today's visit.  -Breast Cancer screening indicated? No.   7. Chronic Disease/Pregnancy Condition follow up: None  - PCP follow up  Reva Bores, MD [facility], [facility]

## 2021-01-12 ENCOUNTER — Other Ambulatory Visit: Payer: Self-pay

## 2021-01-12 ENCOUNTER — Ambulatory Visit (INDEPENDENT_AMBULATORY_CARE_PROVIDER_SITE_OTHER): Payer: Medicaid Other

## 2021-01-12 VITALS — BP 116/71 | HR 80 | Ht 63.0 in | Wt 165.4 lb

## 2021-01-12 DIAGNOSIS — Z30431 Encounter for routine checking of intrauterine contraceptive device: Secondary | ICD-10-CM

## 2021-01-12 NOTE — Progress Notes (Signed)
   GYNECOLOGY CLINIC PROGRESS NOTE  History:  25 y.o. [id number] here at [facility] today for today for IUD string check; Liletta IUD was placed on 12/14/20. No complaints about the IUD, no concerning side effects. Has occasional spotting, sometimes discomfort with intercourse.  The following portions of the patient's history were reviewed and updated as appropriate: allergies, current medications, past family history, past medical history, past social history, past surgical history and problem list. Last pap smear on 05/25/2020 was NILM.  Review of Systems:  Pertinent items are noted in HPI.   Objective:  Physical Exam Blood pressure 116/71, pulse 80, height 5\' 3"  (1.6 m), weight 165 lb 6.4 oz (75 kg), currently breastfeeding. Gen: NAD Abd: Soft, nontender and nondistended Pelvic: Normal appearing external genitalia; normal appearing vaginal mucosa and cervix. IUD strings visualized, about 3 cm in length outside cervix.   Assessment & Plan:  Normal IUD check. Patient to keep IUD in place for five years; can come in for removal if she desires pregnancy within the next five years. Routine preventative health maintenance measures emphasized.   , CNM 01/12/21 11:05 AM

## 2021-01-12 NOTE — Progress Notes (Signed)
Pt states is having spotting & feeling pain during sex. Wants to know is that normal.

## 2021-06-30 IMAGING — US US BREAST*R* LIMITED INC AXILLA
1 series · 9 of 9 positions shown · non-contrast
Comparison: Baseline evaluation

CLINICAL DATA: Palpable abnormality in the RIGHT breast noted on
recent physical exam. Patient is 8 months pregnant. Patient does not
really feel a mass in the RIGHT breast.

EXAM:
ULTRASOUND OF THE RIGHT BREAST

[Series 1: us breast*right* limited inc axilla · 0.07mm/px · 9 of 9 slices shown]
[im 1/9]
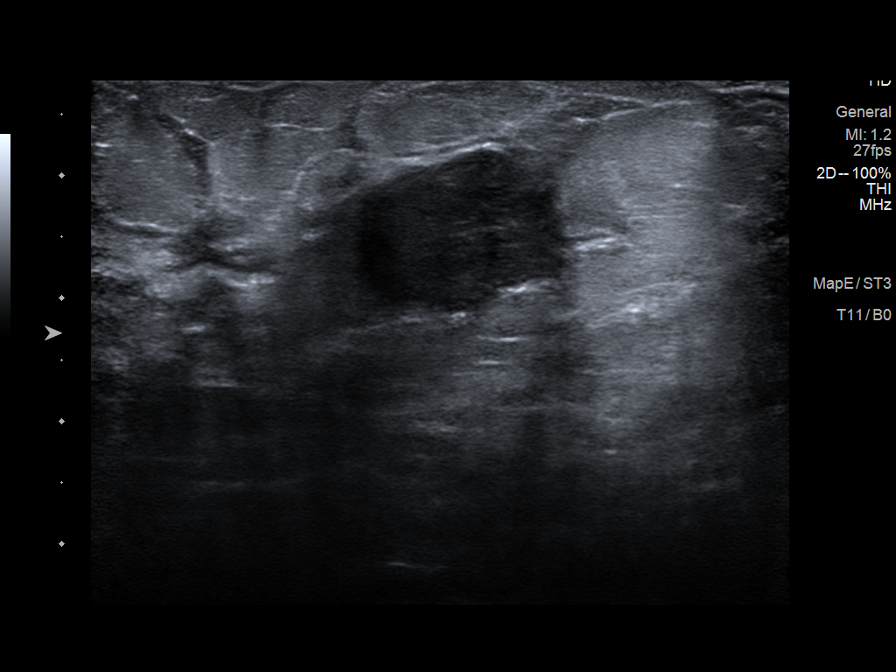
[im 2/9]
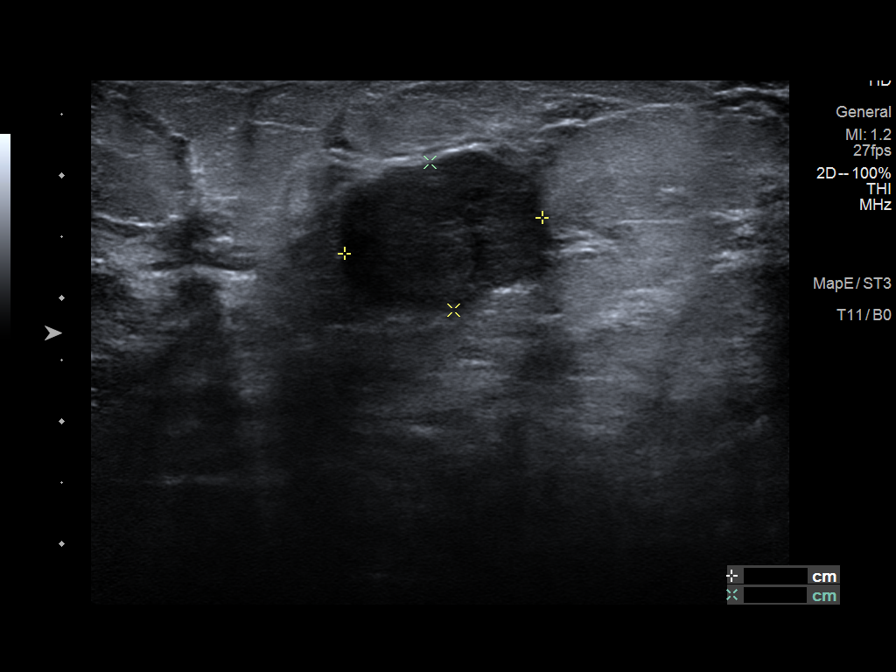
[im 3/9]
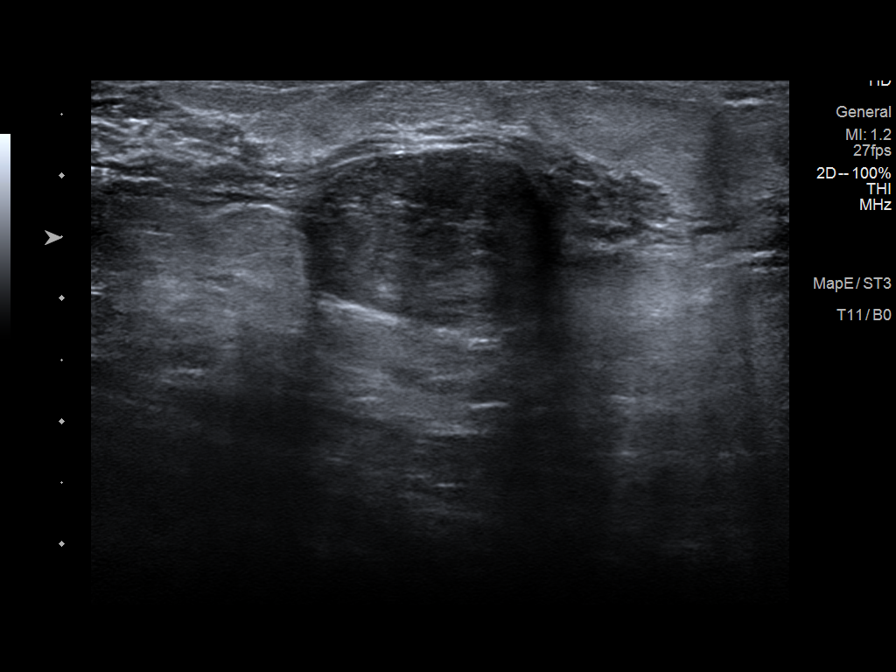
[im 4/9]
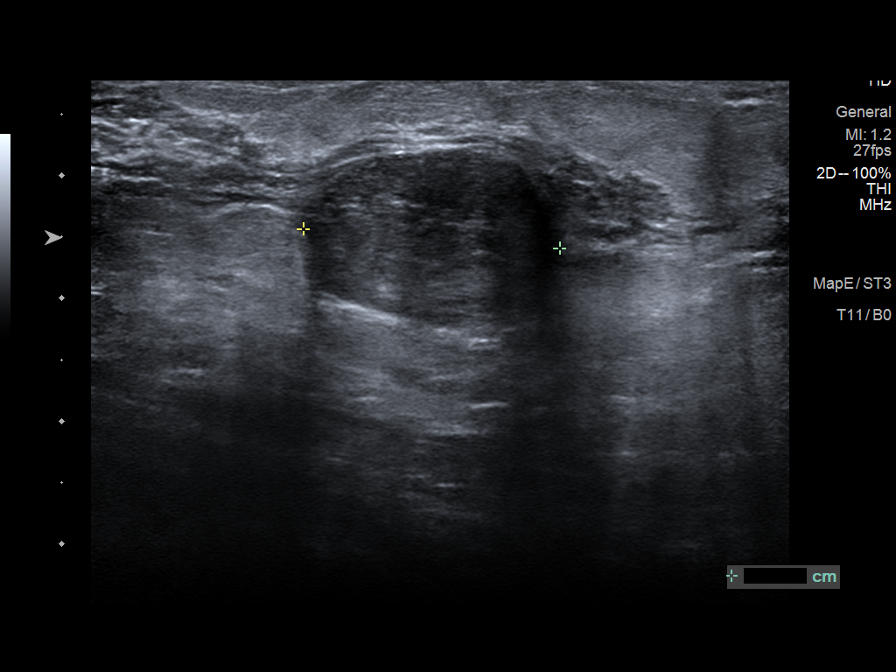
[im 5/9]
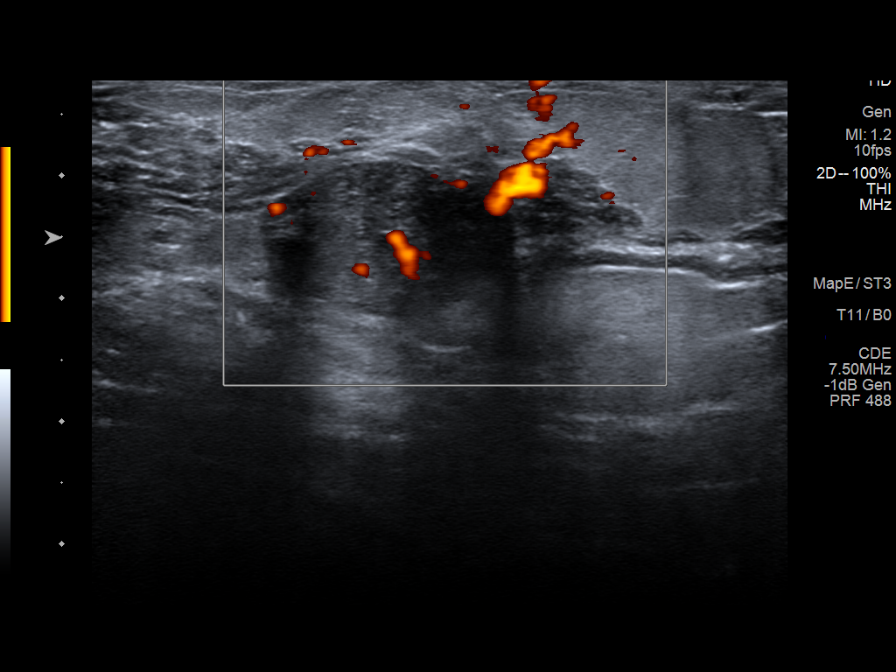
[im 6/9]
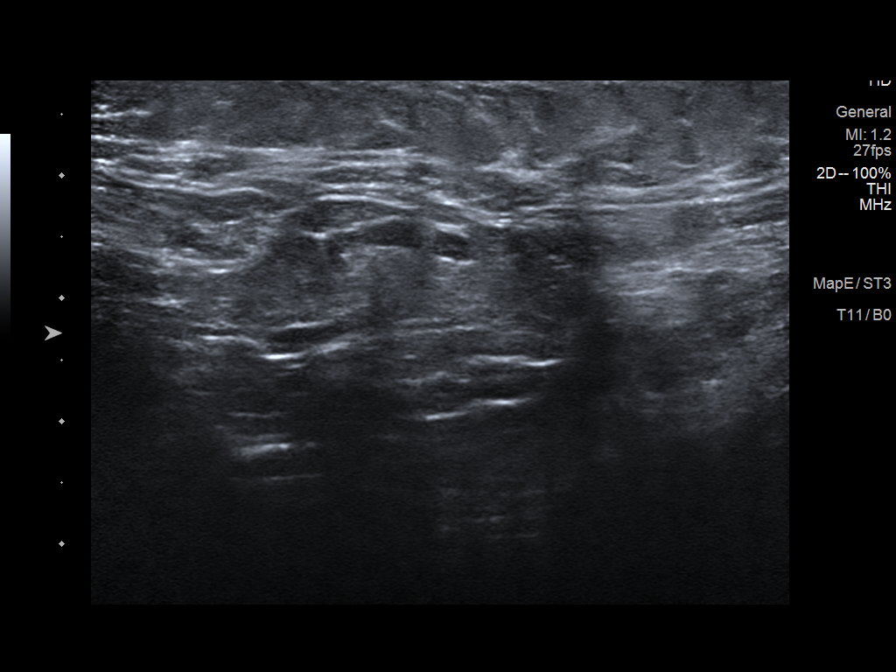
[im 7/9]
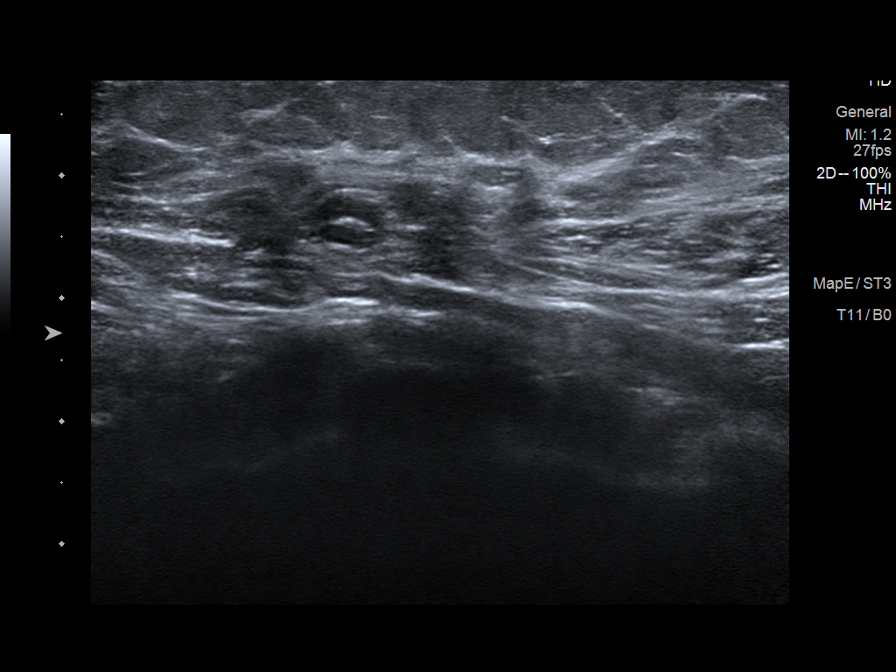
[im 8/9]
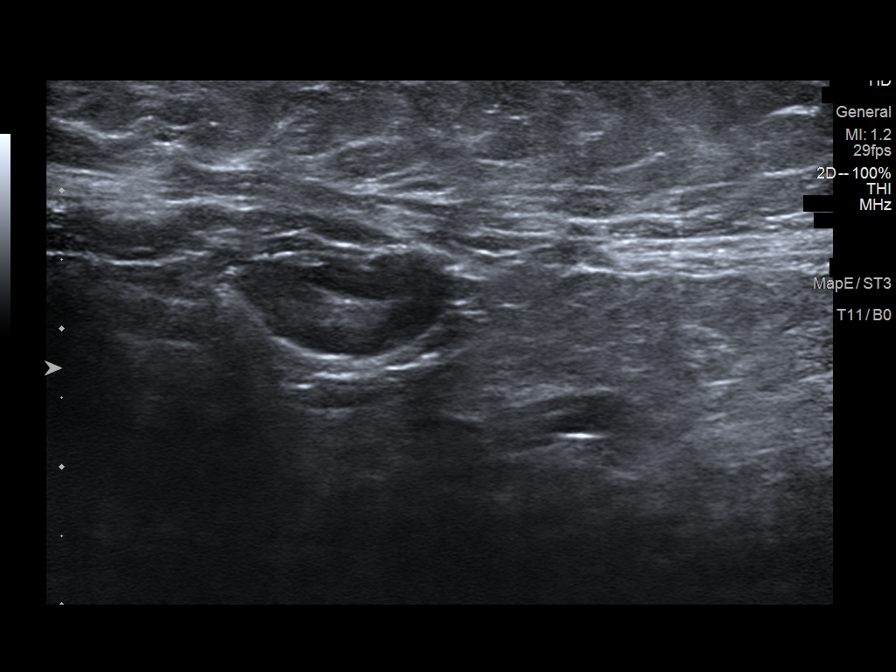
[im 9/9]
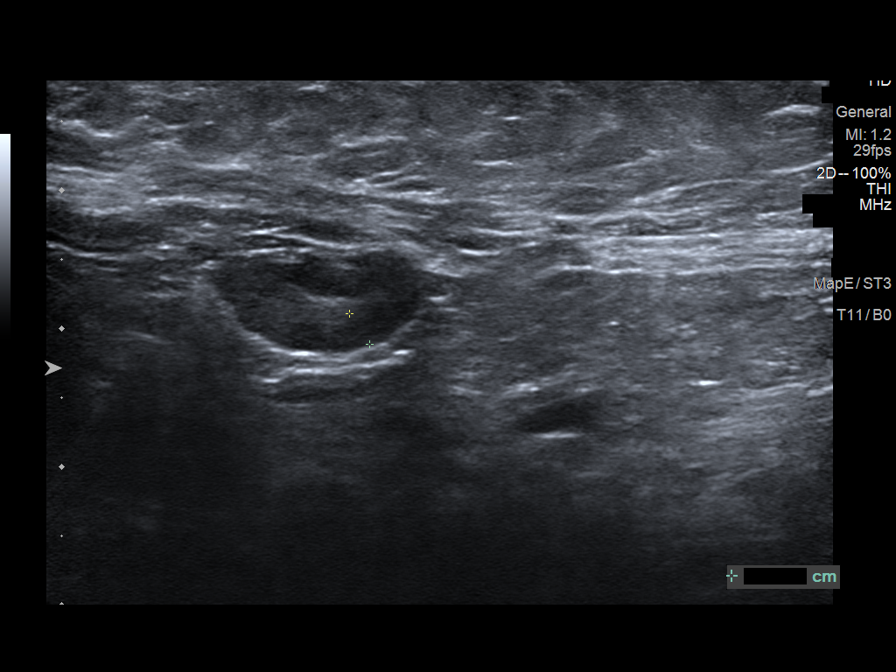

[9 of 9 positions shown; findings below may reference images not displayed]

FINDINGS: On physical exam, I palpate a rounded mobile superficial mass in the
LOWER OUTER QUADRANT of the RIGHT breast.

Targeted ultrasound is performed, showing a circumscribed oval
hypoechoic parallel mass in the 7 o'clock location of the RIGHT
breast 4 centimeters from the nipple measuring 2.1 x 1.6 x
centimeters. Presence of internal blood flow documented by Doppler
evaluation. Targeted evaluation of the RIGHT axilla shows no
adenopathy.
IMPRESSION: Findings most likely consistent with fibroadenoma or lactating
adenoma. We discussed management options including excision, biopsy,
and close follow-up. Imaging followup is recommended at 6, 12, and
24 months to assess stability. The patient concurs with this plan.

RECOMMENDATION:
Recommend RIGHT breast ultrasound in 3 months following delivery.

I have discussed the findings and recommendations with the patient
with the assistance of an interpreter. If applicable, a reminder
letter will be sent to the patient regarding the next appointment.

BI-RADS CATEGORY  3: Probably benign.

## 2021-07-29 IMAGING — US US ABDOMEN LIMITED
1 series · 15 of 25 positions shown · non-contrast
Comparison: None.

CLINICAL DATA: 24-year-old female with RIGHT UPPER quadrant pain
and vomiting.

EXAM:
ULTRASOUND ABDOMEN LIMITED RIGHT UPPER QUADRANT

[Series 1: us abdomen limited · 57 acquisitions, 15 frames shown]
[im 1/57]
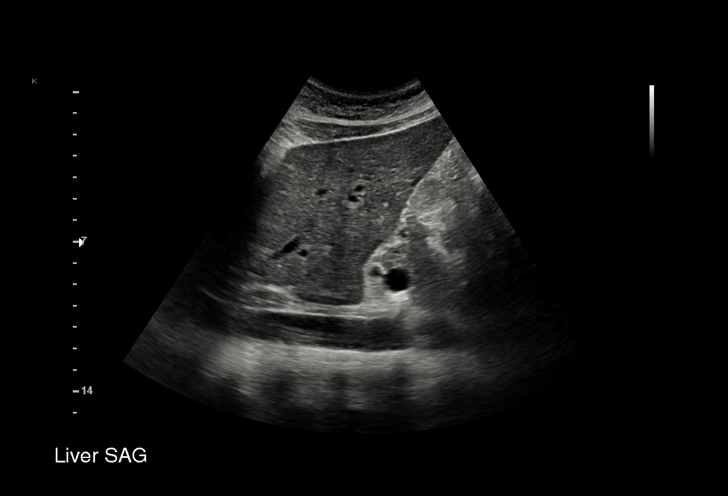
[im 5/57]
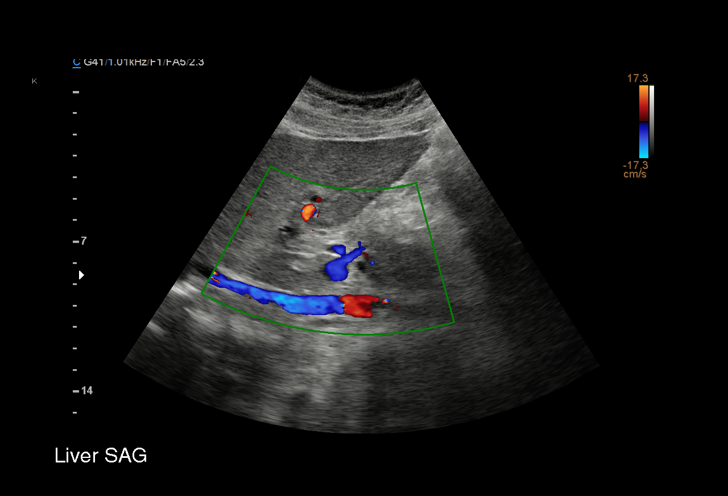
[im 10/57]
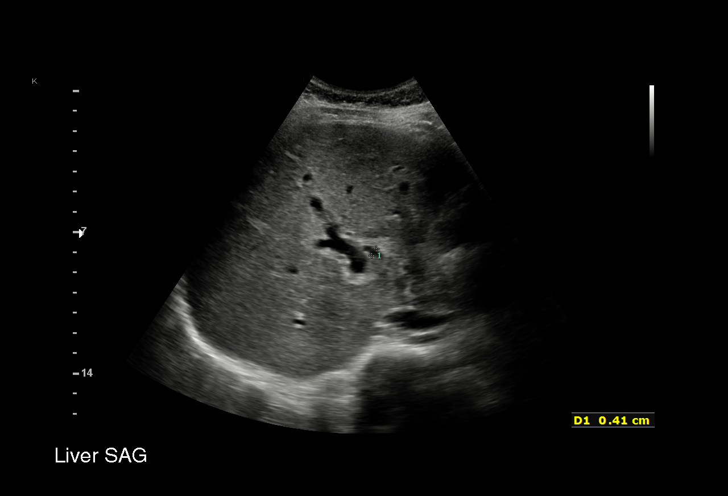
[im 12/57]
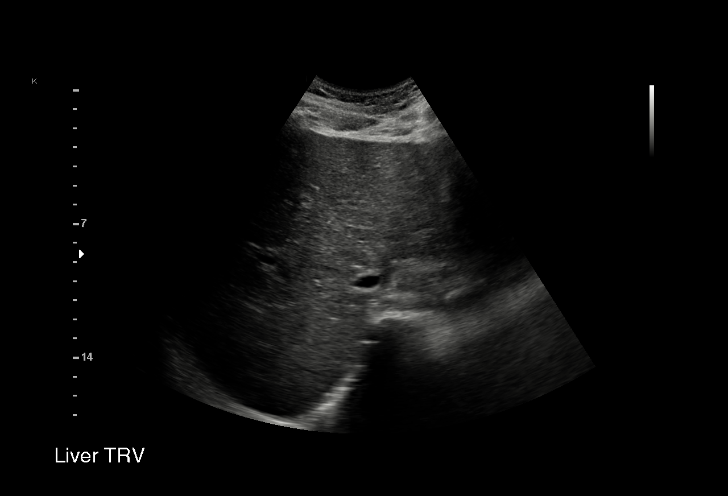
[im 17/57]
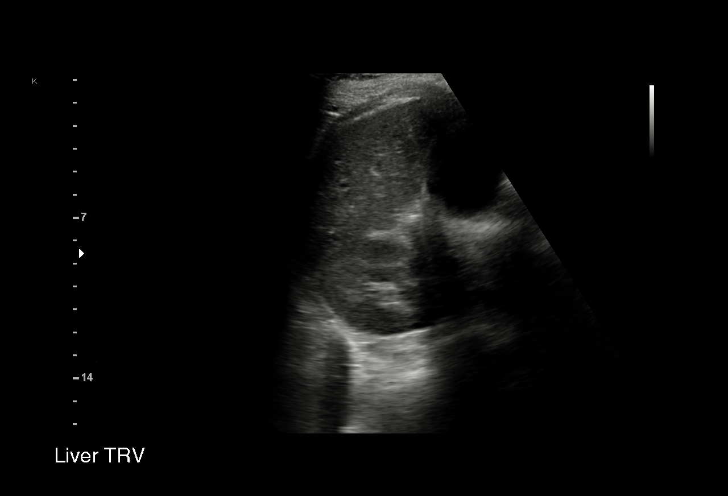
[im 22/57]
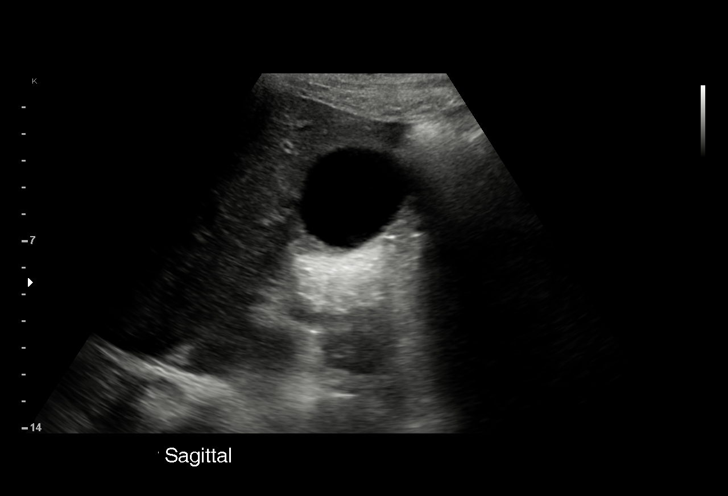
[im 24/57]
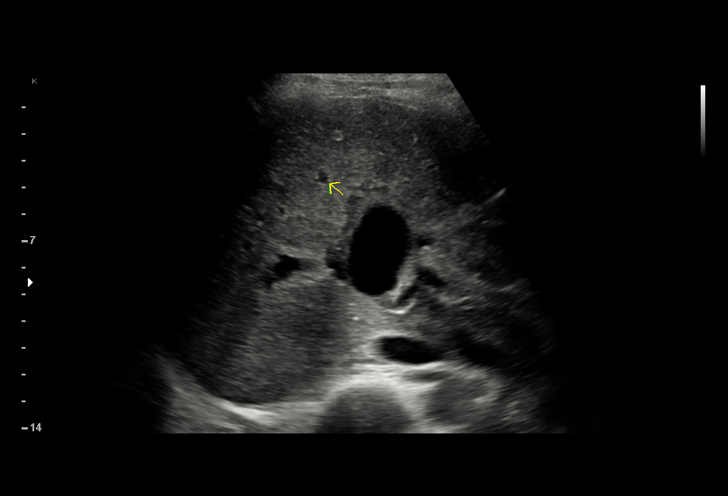
[im 29/57]
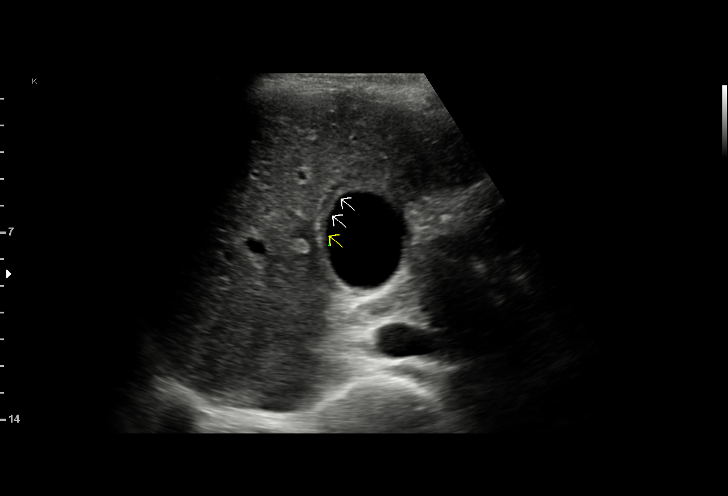
[im 33/57]
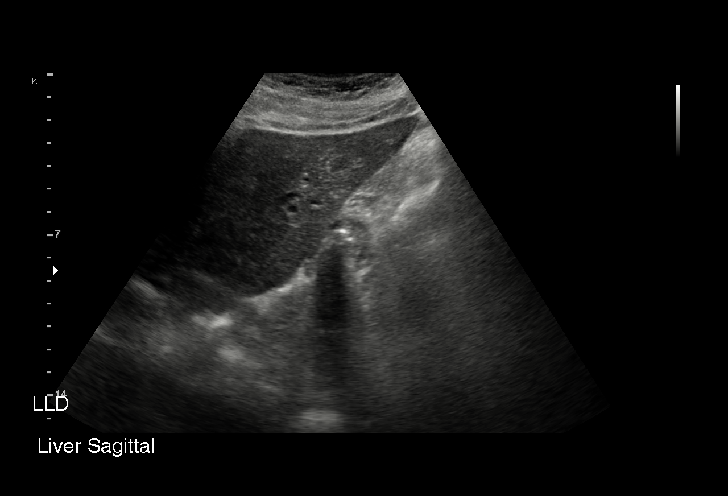
[im 36/57]
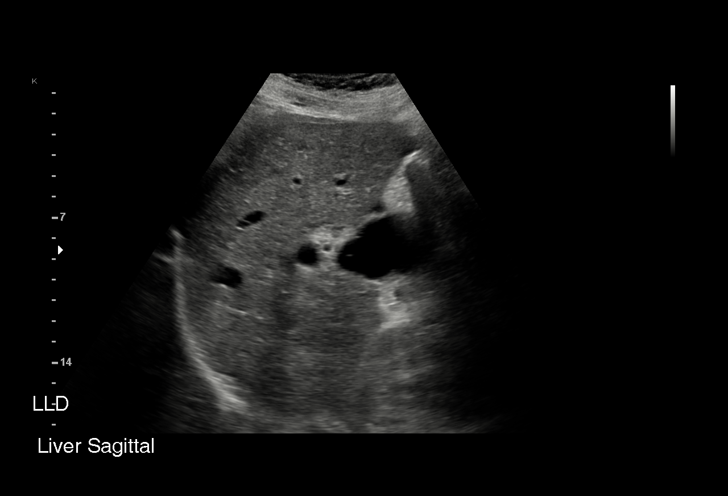
[im 40/57]
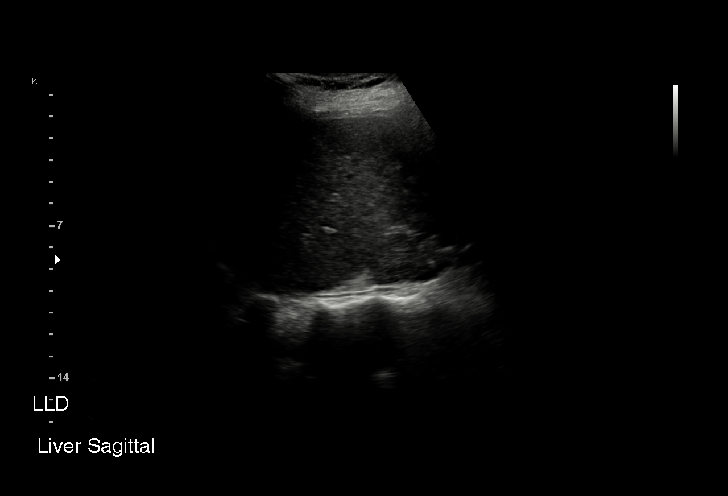
[im 45/57]
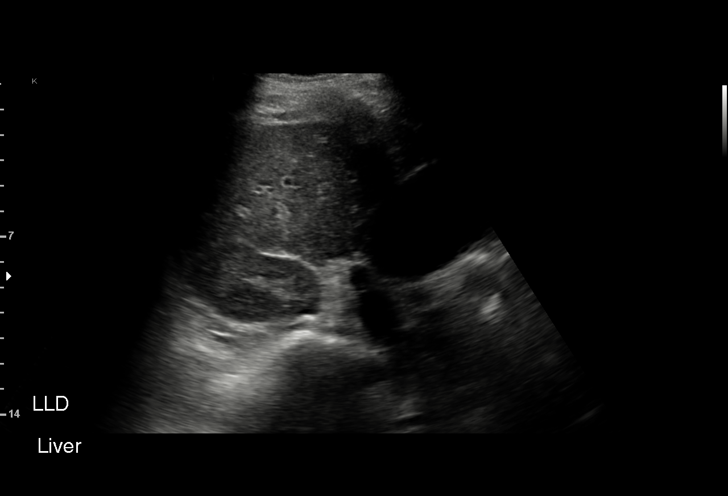
[im 47/57]
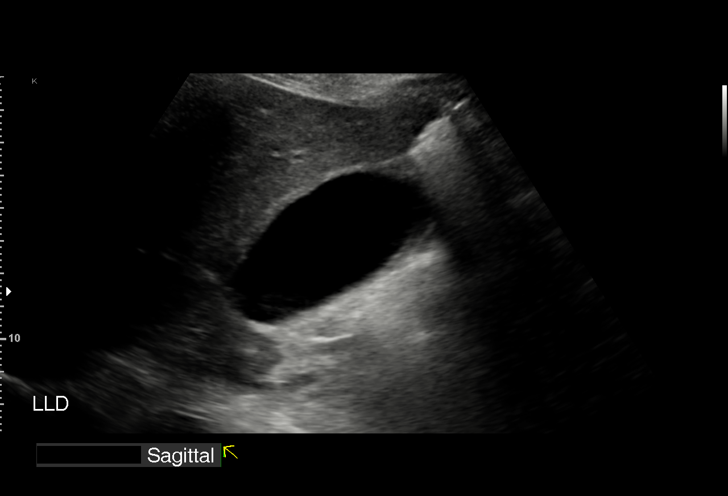
[im 52/57]
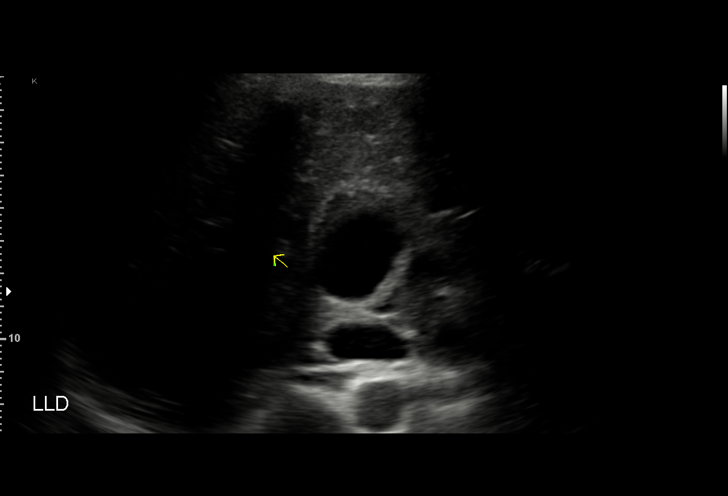
[im 57/57]
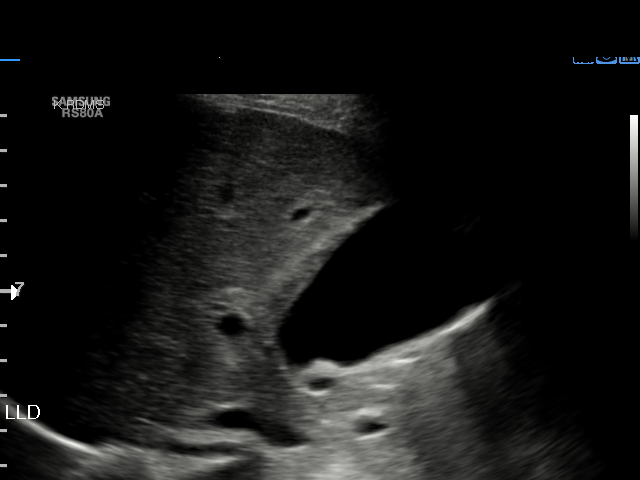

[15 of 25 positions shown; findings below may reference images not displayed]

FINDINGS: Gallbladder:

Equivocal mild focal gallbladder wall thickening adjacent to the
liver is noted. There is no evidence of sonographic Murphy sign or
cholelithiasis.

Common bile duct:

Diameter: 5 mm. No intrahepatic or extrahepatic biliary dilatation
noted.

Liver:

No focal lesion identified. Within normal limits in parenchymal
echogenicity. Portal vein is patent on color Doppler imaging with
normal direction of blood flow towards the liver.

Other: None.
IMPRESSION: 1. Equivocal focal gallbladder wall thickening adjacent to the
liver. Given no sonographic Murphy sign or evidence of
cholelithiasis, this is not felt to represent acute cholecystitis.
Consider ultrasound follow-up as clinically indicated.
2. No other significant abnormalities.

## 2021-07-29 IMAGING — US US MFM FETAL BPP W/ NON-STRESS
1 series · 14 of 28 positions shown · non-contrast
Comparison: none

[Series 1: us mfm fetal bpp w/ non-stress · 63 acquisitions, 14 frames shown]
[im 3/63]
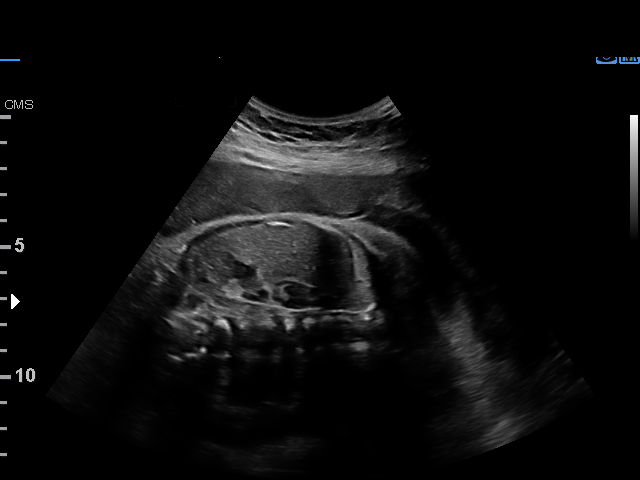
[im 7/63]
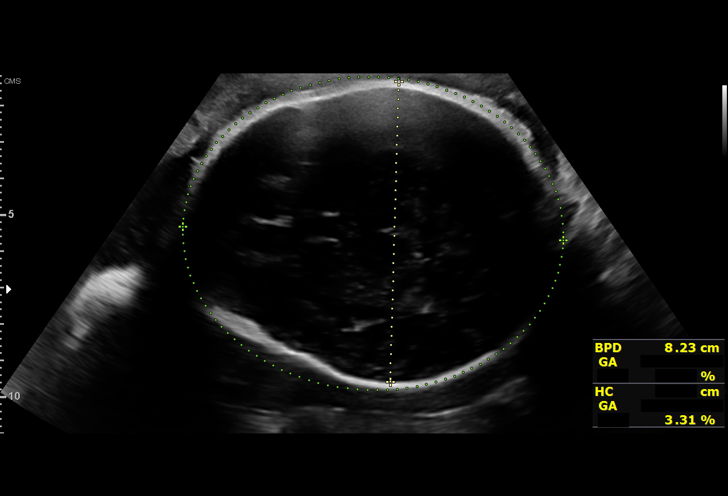
[im 12/63]
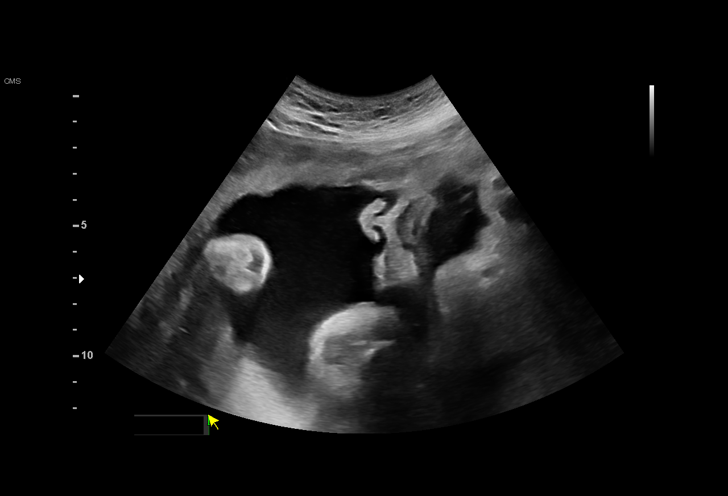
[im 17/63]
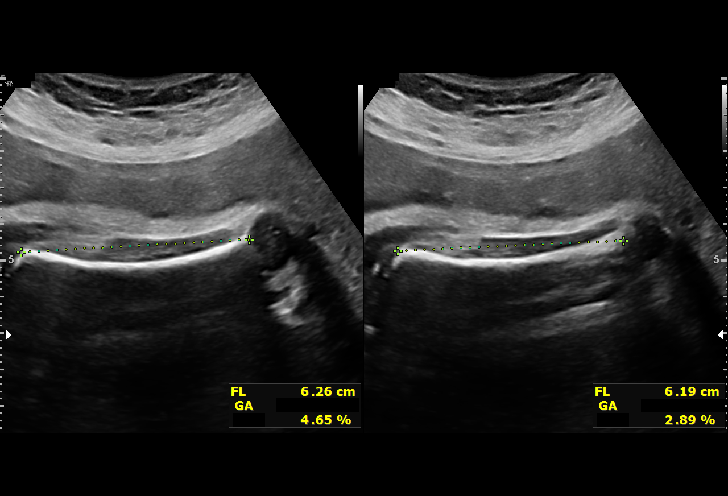
[im 21/63]
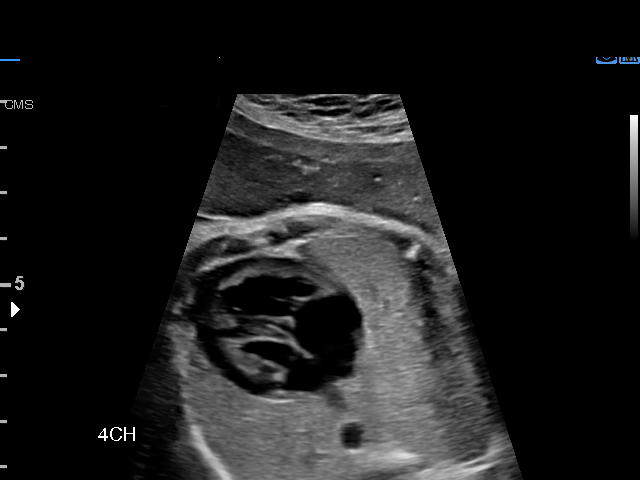
[im 26/63]
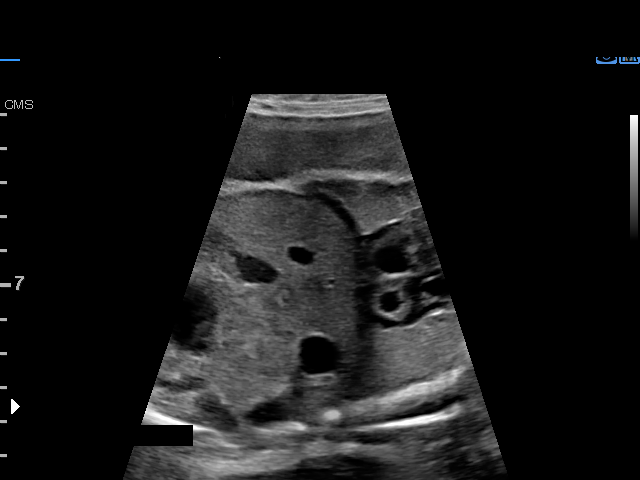
[im 30/63]
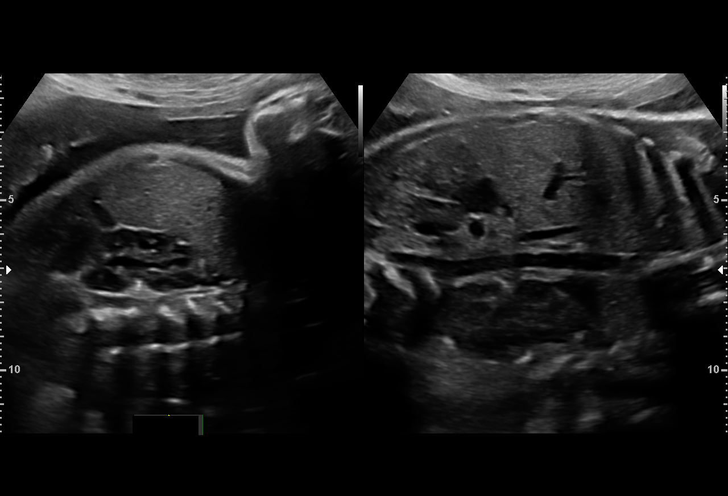
[im 35/63]
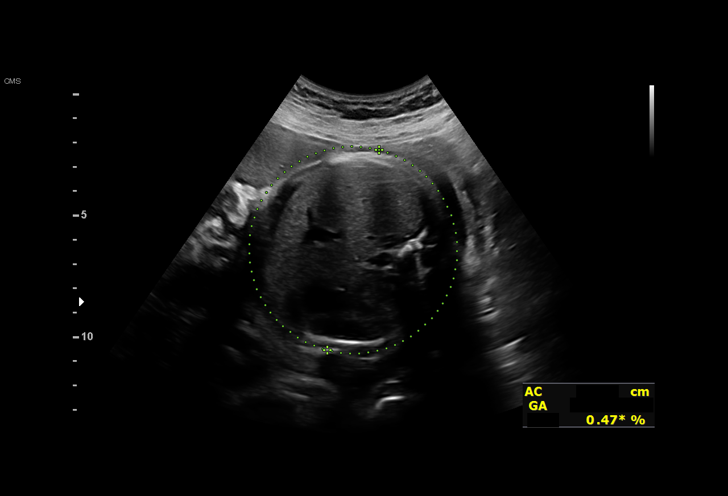
[im 40/63]
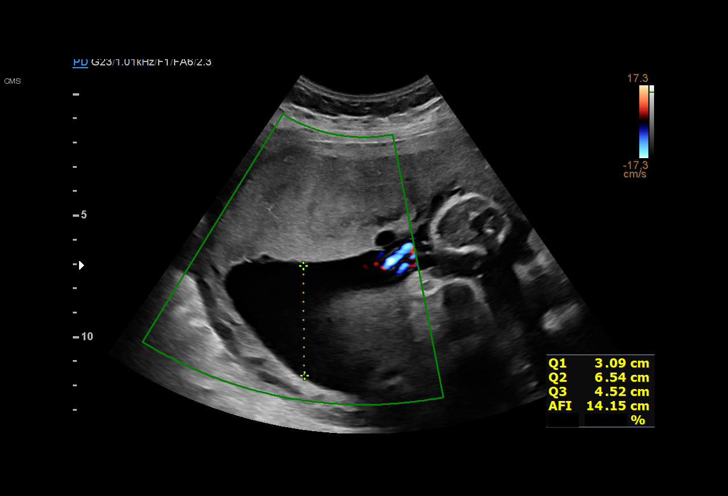
[im 44/63]
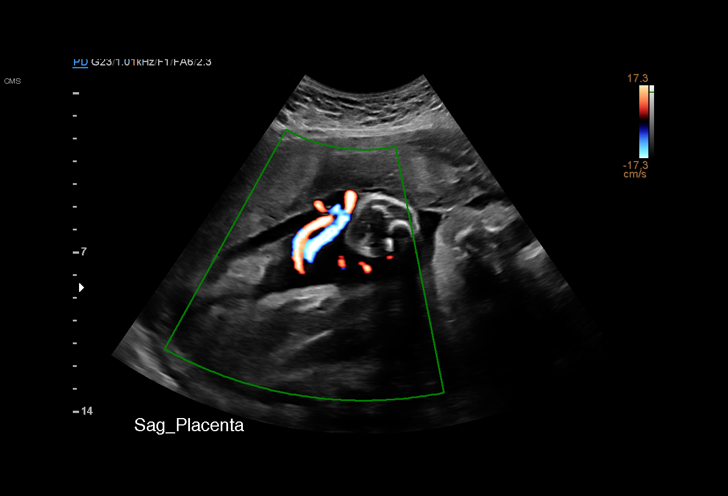
[im 49/63]
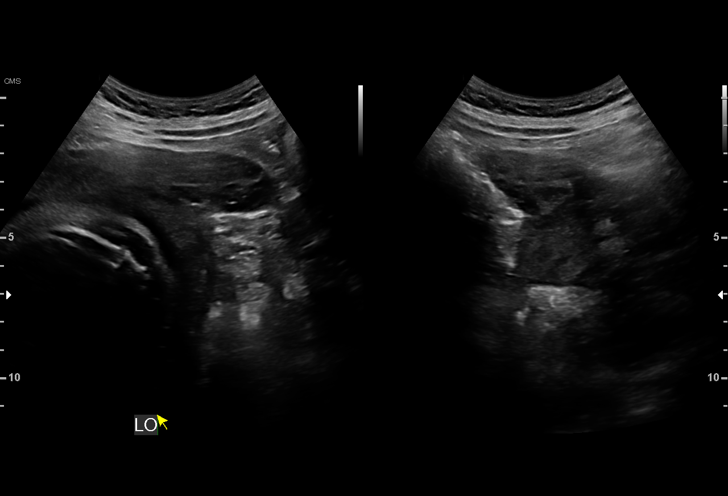
[im 53/63]
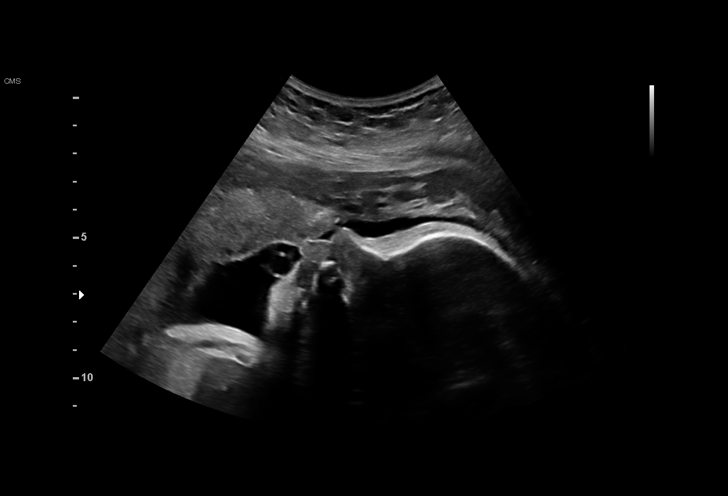
[im 58/63]
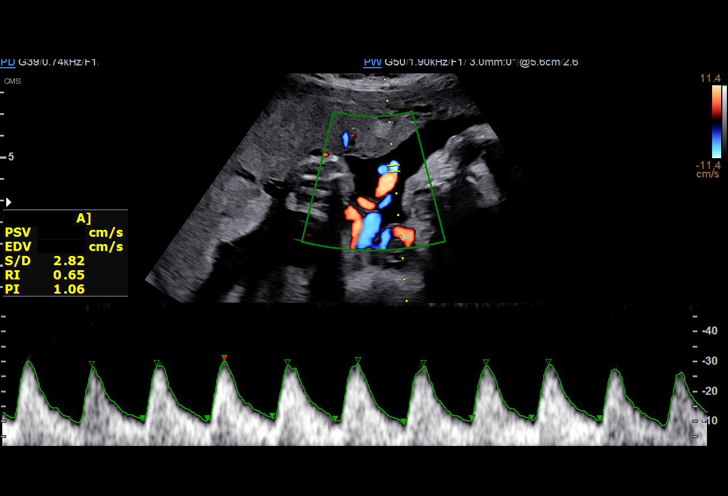
[im 63/63]
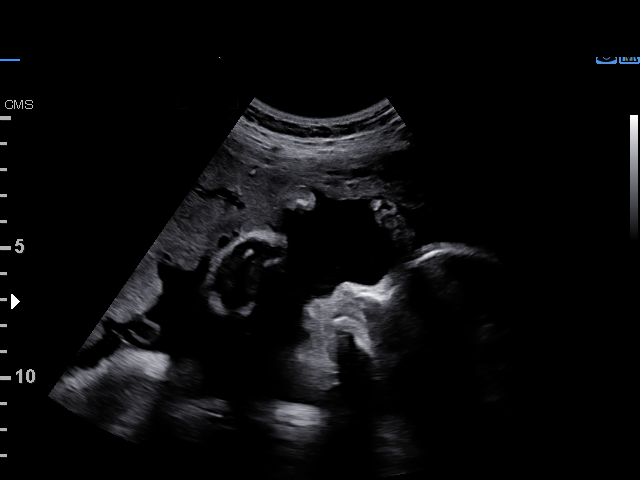

[14 of 28 positions shown; findings below may reference images not displayed]

[REDACTED]-
                    Faculty Physician

Indications

 Encounter for other antenatal screening
 follow-up
 Maternal care for known or suspected poor
 fetal growth, third trimester, not applicable or
 unspecified IUGR
 Abnormal fetal ultrasound (elevated UA
 Doppler)
 Late to prenatal care, third trimester
 34 weeks gestation of pregnancy
Fetal Evaluation

 Num Of Fetuses:          1
 Fetal Heart Rate(bpm):   141
 Cardiac Activity:        Observed
 Presentation:            Cephalic
 Placenta:                Anterior
 P. Cord Insertion:       Visualized

 Amniotic Fluid
 AFI FV:      Within normal limits

 AFI Sum(cm)     %Tile       Largest Pocket(cm)
 16.9            62
 RUQ(cm)       RLQ(cm)        LUQ(cm)        LLQ(cm)

Biophysical Evaluation

 Amniotic F.V:   Within normal limits        F. Tone:         Not Observed
 F. Movement:    Not Observed                N.S.T:           Reactive
 F. Breathing:   Observed                    Score:           [DATE]
Biometry

 BPD:      81.8   mm     G. Age:  32w 6d         11  %    CI:         75.47  %    70 - 86
                                                          FL/HC:       20.8  %    19.4 -
 HC:      298.6   mm     G. Age:  33w 1d        2.4  %    HC/AC:       1.10       0.96 -
 AC:      271.5   mm     G. Age:  31w 2d        < 1  %    FL/BPD:      76.0  %    71 - 87
 FL:       62.2   mm     G. Age:  32w 2d        3.5  %    FL/AC:       22.9  %    20 - 24
 HUM:      53.6   mm     G. Age:  31w 1d        < 5  %

 Est. FW:    7675   gm      4 lb 1 oz    2.4  %
OB History

 Gravidity:     1         Term:  0          Prem:  0        SAB:   0
 TOP:           0       Ectopic: 0         Living: 0
Gestational Age

 LMP:            36w 2d       Date:  11/03/18                   EDD:  08/10/19
 U/S Today:      32w 3d                                         EDD:  09/06/19
 Best:           34w 3d    Det. By:  Previous Ultrasound        EDD:  08/23/19
                                     (06/07/19)
Anatomy

 Cranium:                Appears normal         Aortic Arch:            Appears normal
 Cavum:                  Appears normal         Ductal Arch:            Not well visualized
 Ventricles:             Appears normal         Diaphragm:              Appears normal
 Choroid Plexus:         Appears normal         Stomach:                Appears normal, left
                                                                        sided
 Cerebellum:             Appears normal         Abdomen:                Appears normal
 Posterior Fossa:        Previously seen        Abdominal Wall:         Previously seen
 Nuchal Fold:            Not applicable (>20    Cord Vessels:           Previously seen
                         wks GA)
 Face:                   Appears normal         Kidneys:                Appear normal
                         (orbits and profile)
 Lips:                   Appears normal         Bladder:                Appears normal
 Thoracic:               Previously seen        Spine:                  Previously seen
 Heart:                  Appears normal         Upper Extremities:      Previously seen
                         (4CH, axis, and situs)
 RVOT:                   Appears normal         Lower Extremities:      Previously seen
 LVOT:                   Appears normal

 Other:   Heels/feet prev visualized. Nasal bone prev visualized.
Doppler - Fetal Vessels

 Umbilical Artery
   S/D     %tile                                              ADFV    RDFV
    3.3       88                                                 No      No

Impression

 Follow up growth given IUGR
 Normal interval growth with measurements consistent with
 dates.
 Biophysical profile [DATE] with a reactive NST
 UA Dopplers are normal

 I discussed today's findings via interpreter with Ms. Garfield and
 her significant other (Via phone call). I recommend further
 monitoring at the [facility] NST with BPP in 4-6 hours. I discussed
 this plan with Dr. Paul Parfait.
Recommendations

 Continue weekly testing with UA Dopplers
 Delivery at 37 weeks given EFW 3%.

## 2021-08-11 IMAGING — US US MFM FETAL BPP W/O NON-STRESS
1 series · 14 of 18 positions shown · non-contrast
Comparison: none

[Series 1: us mfm fetal bpp w/o non-stress · 18 acquisitions, 14 frames shown]
[im 1/18]
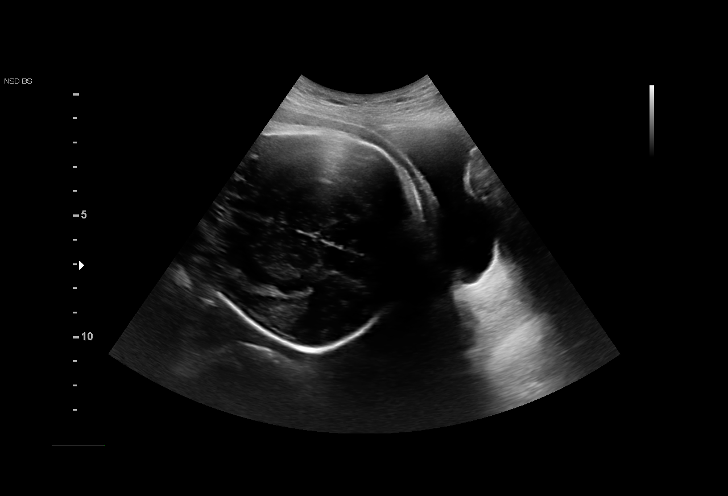
[im 2/18]
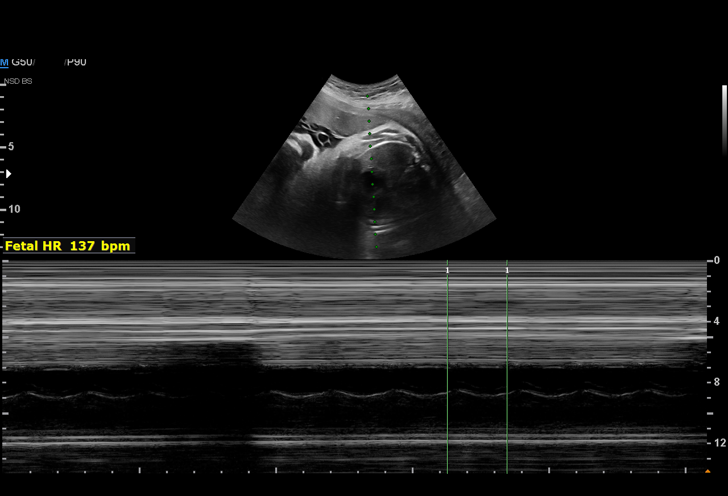
[im 4/18]
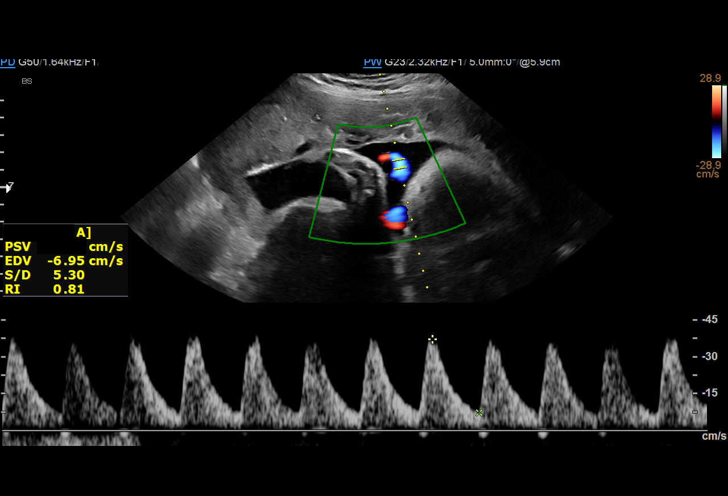
[im 5/18]
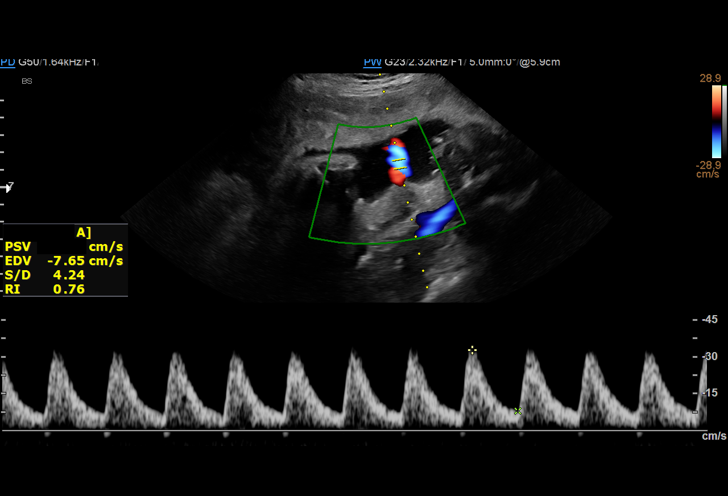
[im 6/18]
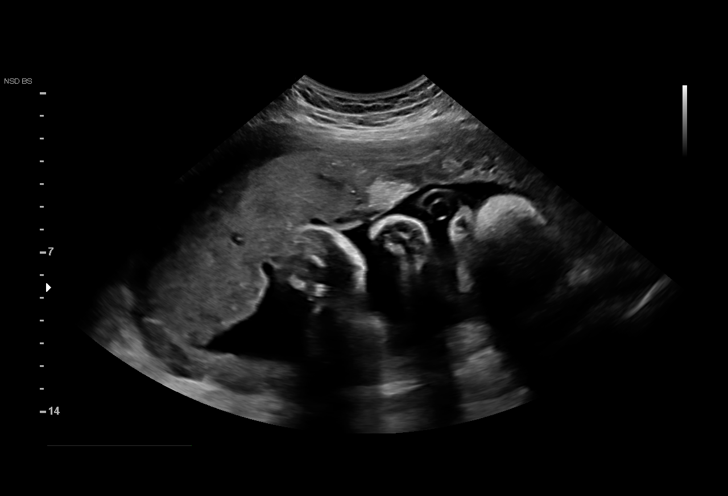
[im 8/18]
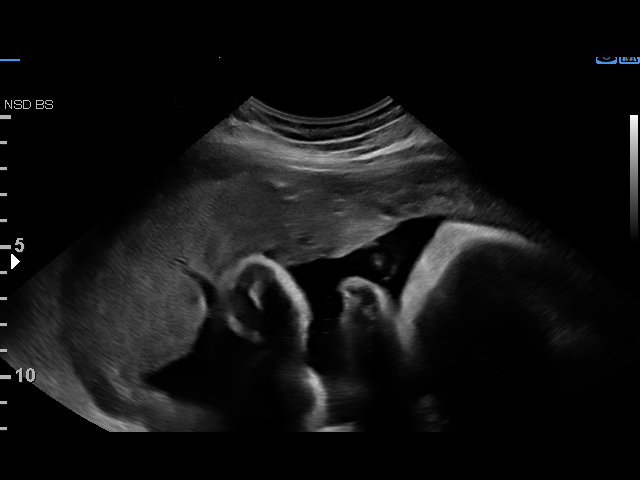
[im 9/18]
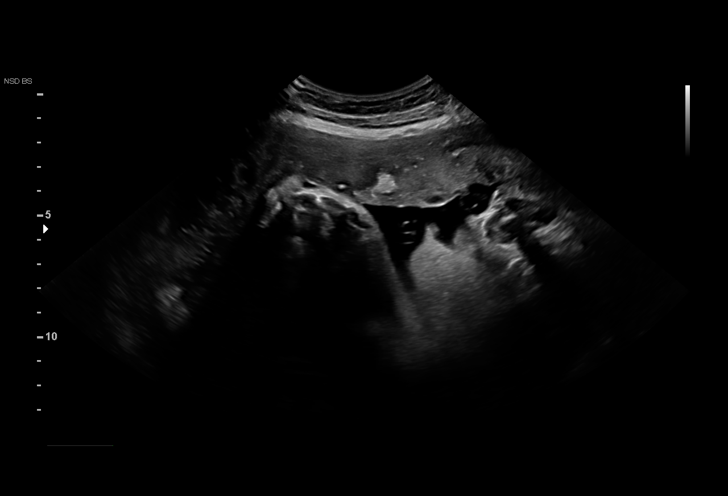
[im 10/18]
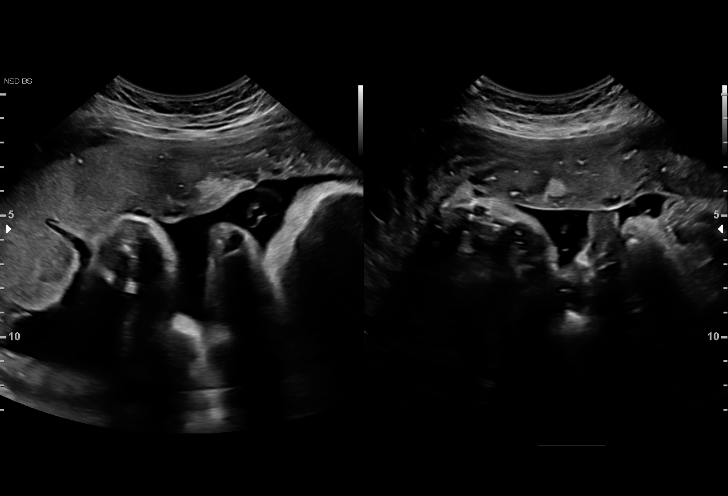
[im 11/18]
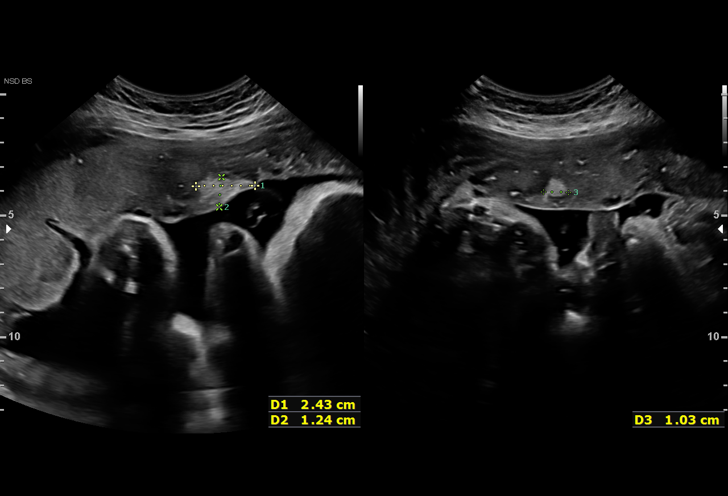
[im 13/18]
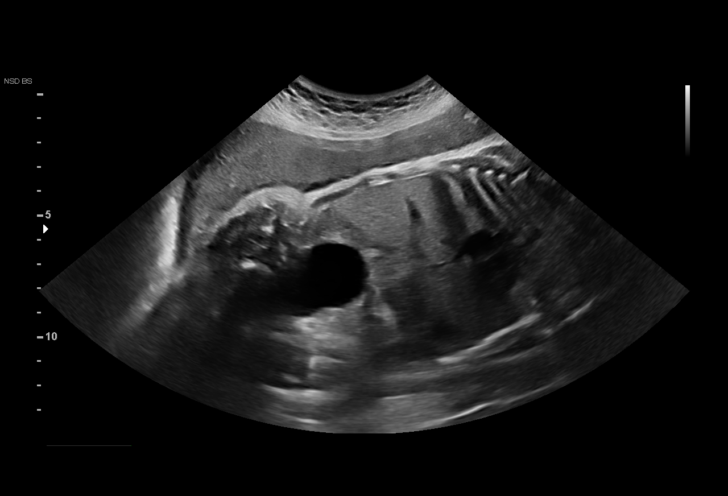
[im 14/18]
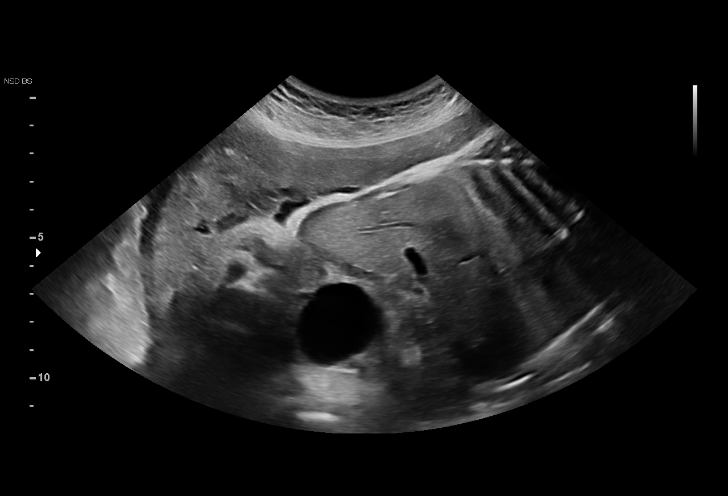
[im 15/18]
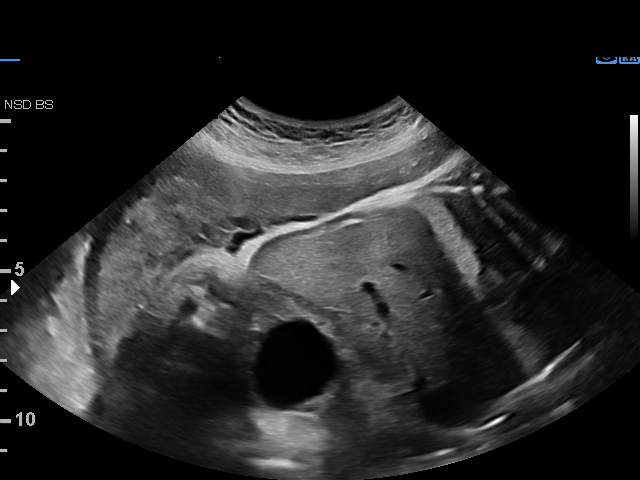
[im 17/18]
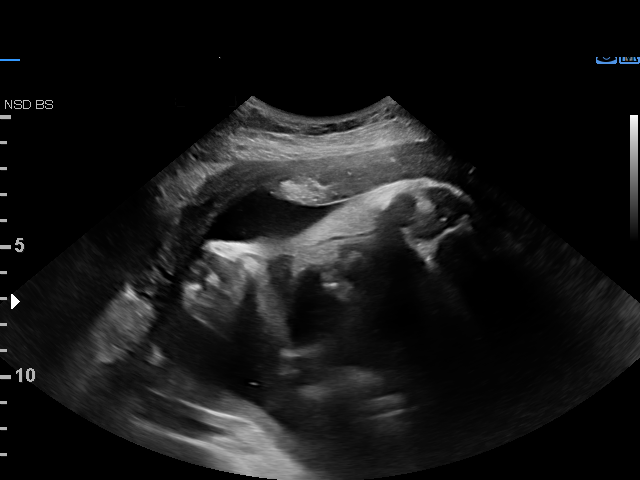
[im 18/18]
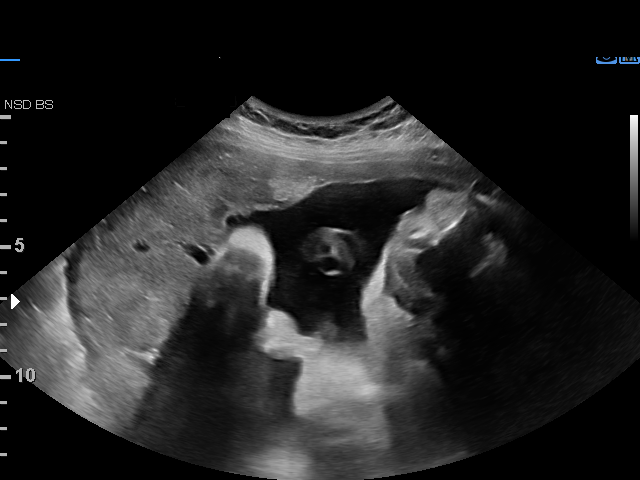

[14 of 18 positions shown; findings below may reference images not displayed]

[REDACTED]-
                   Faculty Physician

 2  US MFM UA CORD DOPPLER                76820.02    [facility]
                                                      [place]

Indications

 Pre-eclampsia - given BMZ 1st dose
 36 weeks gestation of pregnancy
 Maternal care for known or suspected poor
 fetal growth, third trimester, not applicable or
 unspecified IUGR
 Late to prenatal care, third trimester
Vital Signs

                                                Height:        5'2"
Fetal Evaluation

 Num Of Fetuses:         1
 Fetal Heart Rate(bpm):  137
 Cardiac Activity:       Observed
 Presentation:           Cephalic

 Amniotic Fluid
 AFI FV:      Within normal limits

 AFI Sum(cm)     %Tile       Largest Pocket(cm)
 14.8            54

 RUQ(cm)       RLQ(cm)       LUQ(cm)        LLQ(cm)

Biophysical Evaluation

 Amniotic F.V:   Within normal limits       F. Tone:        Observed
 F. Movement:    Observed                   Score:          [DATE]
 F. Breathing:   Observed
OB History

 Gravidity:    1         Term:   0        Prem:   0        SAB:   0
 TOP:          0       Ectopic:  0        Living: 0
Gestational Age

 LMP:           38w 1d        Date:  11/03/18                 EDD:   08/10/19
 Best:          36w 2d     Det. By:  Previous Ultrasound      EDD:   08/23/19
                                     (06/07/19)
Doppler - Fetal Vessels

 Umbilical Artery
  S/D     %tile      RI    %tile                             ADFV    RDFV
  4.87   > 97.5    0.79   > 97.5                                No      No

Impression

 Severe fetal growth restriction.  On ultrasound performed 2
 weeks ago, the estimated fetal weight was at less than the
 3rd percentile.  Patient also has preeclampsia without severe
 features.  She has been checking blood pressures at home
 and they are in the 130s/90s.  She does not have symptoms
 of severe features of preeclampsia.
 Pressures today at our office is 154/101 mmHg and 136/100
 mmHg.  Pulse rates were 104 and 90/min.

 On today's ultrasound, amniotic fluid is normal and good fetal
 activity seen.  Cephalic presentation.  Antenatal testing is
 reassuring.  BPP [DATE].  Umbilical artery Doppler showed
 increased S/D ratio.

 I counseled the patient that she has preeclampsia with mild
 features and severe fetal growth restriction with abnormal
 Doppler studies.  Our recommendation would be to deliver at
 37 weeks.  However because of her labile blood pressure
 and abnormal Doppler studies, I recommended admission
 today and inpatient management till 37 weeks.  This is likely
 to reduce complication of stillbirth.

 Patient reported that she is uncomfortable in the hospital and
 would prefer to monitor her blood pressures at home.  She
 declined hospital admission.
Recommendations

 -An appointment was made for her to return in 2 days for NST.
 -I will be communicating ([REDACTED] message) with her provided to
 set up a date for induction at 37 weeks.
                 Schumann, Chata

## 2022-12-12 NOTE — Progress Notes (Signed)
    GYNECOLOGY OFFICE PROCEDURE NOTE  Christie Parker is a 27 y.o. [id number] here for IUD removal. Liletta IUD was placed on 12/14/20. 05/2020 - pap wnl.   IUD Removal  Patient identified, informed consent performed, consent signed.  Patient was in the dorsal lithotomy position, normal external genitalia was noted.  A speculum was placed in the patient's vagina, normal discharge was noted, no lesions. The cervix was visualized, no lesions, no abnormal discharge.  The strings of the IUD were grasped and pulled using ring forceps. The IUD was removed in its entirety.   Patient tolerated the procedure well.    Patient plans for pregnancy soon and she was told to avoid teratogens, take PNV and folic acid.  Routine preventative health maintenance measures emphasized.   Milas Hock, MD, FACOG Obstetrician & Gynecologist, [facility] for [facility], [facility]

## 2022-12-19 ENCOUNTER — Other Ambulatory Visit: Payer: Self-pay

## 2022-12-19 ENCOUNTER — Encounter: Payer: Self-pay | Admitting: Obstetrics and Gynecology

## 2022-12-19 ENCOUNTER — Ambulatory Visit: Payer: Medicaid Other | Admitting: Obstetrics and Gynecology

## 2022-12-19 VITALS — BP 134/83 | HR 88 | Ht 62.0 in | Wt 168.6 lb

## 2022-12-19 DIAGNOSIS — Z30432 Encounter for removal of intrauterine contraceptive device: Secondary | ICD-10-CM | POA: Diagnosis not present

## 2023-02-06 ENCOUNTER — Ambulatory Visit (INDEPENDENT_AMBULATORY_CARE_PROVIDER_SITE_OTHER): Payer: Medicaid Other

## 2023-02-06 DIAGNOSIS — Z3201 Encounter for pregnancy test, result positive: Secondary | ICD-10-CM | POA: Diagnosis not present

## 2023-02-06 LAB — POCT PREGNANCY, URINE: Preg Test, Ur: POSITIVE — AB

## 2023-02-06 MED ORDER — PRENATAL VITAMIN 27-0.8 MG PO TABS: 1.0000 | ORAL_TABLET | Freq: Every day | ORAL | 6 refills | Status: AC

## 2023-02-06 NOTE — Patient Instructions (Signed)

## 2023-02-06 NOTE — Progress Notes (Signed)
 Pt left urine for walk in UPT resulting positive.   Pt denies VB and pain.  Medications and allergies reviewed.  Pt reports LMP 12/22/22, EDD 09/28/23, and 6w 4d today.  Pt requests PNV prescription  E-prescribed according to standing protocol.  Pt advised when to go to [facility]U for evaluation and that the front office will contact her with appt.  Pt verbalized understanding with no further questions.   Jashay Roddy,RN  02/11/23

## 2023-03-04 ENCOUNTER — Telehealth: Payer: Medicaid Other

## 2023-03-04 DIAGNOSIS — O0991 Supervision of high risk pregnancy, unspecified, first trimester: Secondary | ICD-10-CM | POA: Diagnosis not present

## 2023-03-04 DIAGNOSIS — O0990 Supervision of high risk pregnancy, unspecified, unspecified trimester: Secondary | ICD-10-CM | POA: Insufficient documentation

## 2023-03-04 DIAGNOSIS — Z3A10 10 weeks gestation of pregnancy: Secondary | ICD-10-CM

## 2023-03-04 MED ORDER — BLOOD PRESSURE KIT DEVI: 1.0000 | 0 refills | Status: AC | PRN

## 2023-03-04 NOTE — Patient Instructions (Signed)
Safe Medications in Pregnancy   Acne:  Benzoyl Peroxide  Salicylic Acid   Backache/Headache:  Tylenol: 2 regular strength every 4 hours OR               2 Extra strength every 6 hours   Colds/Coughs/Allergies:  Benadryl (alcohol free) 25 mg every 6 hours as needed  Breath right strips  Claritin  Cepacol throat lozenges  Chloraseptic throat spray  Cold-Eeze- up to three times per day  Cough drops, alcohol free  Flonase (by prescription only)  Guaifenesin  Mucinex  Robitussin DM (plain only, alcohol free)  Saline nasal spray/drops  Sudafed (pseudoephedrine) & Actifed * use only after [redacted] weeks gestation and if you do not have high blood pressure  Tylenol  Vicks Vaporub  Zinc lozenges  Zyrtec   Constipation:  Colace  Ducolax suppositories  Fleet enema  Glycerin suppositories  Metamucil  Milk of magnesia  Miralax  Senokot  Smooth move tea   Diarrhea:  Kaopectate  Imodium A-D   *NO pepto Bismol   Hemorrhoids:  Anusol  Anusol HC  Preparation H  Tucks   Indigestion:  Tums  Maalox  Mylanta  Zantac  Pepcid   Insomnia:  Benadryl (alcohol free) 25mg  every 6 hours as needed  Tylenol PM  Unisom, no Gelcaps   Leg Cramps:  Tums  MagGel   Nausea/Vomiting:  Bonine  Dramamine  Emetrol  Ginger extract  Sea bands  Meclizine  Nausea medication to take during pregnancy:  Unisom (doxylamine succinate 25 mg tablets) Take one tablet daily at bedtime. If symptoms are not adequately controlled, the dose can be increased to a maximum recommended dose of two tablets daily (1/2 tablet in the morning, 1/2 tablet mid-afternoon and one at bedtime).  Vitamin B6 100mg  tablets. Take one tablet twice a day (up to 200 mg per day).   Skin Rashes:  Aveeno products  Benadryl cream or 25mg  every 6 hours as needed  Calamine Lotion  1% cortisone cream   Yeast infection:  Gyne-lotrimin 7  Monistat 7    **If taking multiple medications, please check labels to avoid  duplicating the same active ingredients  **take medication as directed on the label  ** Do not exceed 4000 mg of tylenol in 24 hours  **Do not take medications that contain aspirin or ibuprofen           [facility] Pediatric Providers  [place] ([address]) [facility] Manson Passey, MD; Deirdre Priest, MD; Lum Babe, MD; Leveda Anna, MD; McDiarmid, MD; Jerene Bears, MD [address] [place] [place], [place] [address] [phone] Mon-Fri 8:30-12:30, 1:30-5:00  Providers come to see babies during newborn hospitalization Only accepting infants of Mother's who are seen at [facility] or have siblings seen at   [facility] Medicaid - Yes; Tricare - Yes   [facility] [place], MD [address], [place], [place] [address] [phone] Mon, Tue, Thur, Fri 8:30-5:00, Wed 10:00-7:00 (closed 1-2pm daily for lunch) [facility] residents with no insurance.  [place] [employer] only with Medicaid/insurance; Tricare - no  [facility]) - Tim and [place], MD; Manson Passey, MD; Ave Filter, MD; Luna Fuse, MD; Kennedy Bucker, MD; Florestine Avers, MD; Melchor Amour, MD; Yetta Barre,  MD; Konrad Dolores, MD; Kathlene November, MD; Jenne Campus, MD; Wynetta Emery, MD; Duffy Rhody, MD; Gerre Couch, NP [address] [place], [place], [place] [address] [phone] Mon, Tue, Thur, Fri 8:30-5:30, Wed 9:30-5:30, Sat 8:30-12:30 Only accepting infants of first-time parents or siblings of current patients  Hospital discharge coordinator will make follow-up appointment Medicaid - yes; Tricare - yes  [place] ([address]) [facility], MD; Earlene Plater, MD; Jamesetta Orleans, MD; Alvera Novel, MD; Rana Snare, MD; [facility], MD; [place], MD; Hosie Poisson, MD; Mayford Knife, MD [address] [place], [place] [address] [phone] Mon-Fri 8:30-5:00, closed for lunch 12:30-1:30; Sat-Sun 10:00-1:00 Accepting Newborns with commercial insurance only, must call prior to  delivery to be accepted into  practice.  Medicaid - no, Tricare - yes   [facility] [address] [place], [place] [address] [phone] or [phone] Mon to Fri 8am to 10pm, Sat 8am to 1pm (virtual only on weekends) Only accepts Medicaid Healthy Blue pts  [facility] ([facility]) - Pediatrics at Elige Radon, MD; Sabino Dick, MD; Quitman Livings, MD; Betha Loa, NP; Claretha Cooper, MD; Lelon Perla, MD [address] [place] [place], [place] [address] [phone] Mon-Fri 8:30-5:30 Medicaid - yes, Tricare - yes  [place] [phone]) [facility] Mowers, MD [address], [place], [place] [address] [phone] Iona Hansen, Wed Fri 8:30-5:00, Sat 8:30-12:00, Closed Thursdays Accepting siblings of established patients and first time mom's if you call prenatally Medicaid- yes; Tricare - yes  [facility] Lutricia Feil, [place]; Tracie Harrier, MD; Rusty Aus; Scifres, PA; Wynelle Link, MD; Azucena Cecil, MD;  [address] [place], [place] [address] [phone] Mon-Fri 8:30-5:00, closed for lunch 1-2 Only accepting newborns of established patients Medicaid- no; Tricare - yes  [facility] [phone]) [facility] Morene Crocker, MD; [address], [place], [place] [address] [phone] Mon-Fri 8:00-5:00 Medicaid - No; Tricare - Yes  [facility], [place]; [place], [place] [address] [place], [place] [address] [phone] Mon-Fri 8:00-5:00 Medicaid - No, Tricare - Yes  [facility] Cardell Peach, MD; Nash Dimmer, MD; [place], [place] [address], [address] [place], [place] [address] [phone]  Mon-Fri 8:00-5:00 Medicaid - No; Tricare - Yes  [facility] [address] [place], [place] [address] [phone] Mon-Fri 8:30-5:00 (lunch 12:00-1:00) Medicaid -Yes; Tricare - Yes  [facility] [place], MD [address] [place],  [place], [place] [address] [phone] Mon-Fri 8:00-5:00 Seeing newborns of current patients only. No new patients Medicaid - No, Tricare - yes  Nature conservation officer at [place] [address] MD [address] [place], [place] [address] [phone] Mon-Fri 8:00-5:00 Medicaid -yes as secondary coverage only; Tricare - yes  [facility] [place], [place]; [place], [place]; Avis Epley, MD; Vonna Kotyk, MD; Clance Boll, MD; [place], [place]; Smoot, NP; Vaughan Basta, MD; [place], MD [address] [place], [place] [address] [phone] Mon-Fri 8:30-5:00, Sat 9:00-11:00 Accepts commercial insurance ONLY. Offers free prenatal information sessions for families. Medicaid - No, Tricare - Call first  [facility] [place], MD; [place], [place]; [place], [place]; [place], [place] [address] [place] [address] [phone] Mon-Fri 7:30-5:30 Medicaid - Yes; Ailene Rud yes  [place] [phone] & [phone])  [facility], MD [address] [place], [place] [address] [phone] Mon-Thur 8:00-6:00, closed for lunch 12-2, closed Fridays Medicaid - yes; Tricare - no  [facility] Dareen Piano, NP; Cyndia Bent, MD; [place], [place]; [place], [place] [address], [place], [place] [address] [phone] Mon-Fri 7:30-4:30 Medicaid - yes, Tricare - yes  [facility]  Juanito Doom, MD; Janene Harvey, NP; Vonita Moss, MD; Donn Pierini, NP [address], [place], [place] [address] [phone] Mon-Fri 8:30-5:00, closed for lunch 1-2, Sat 8:30-12:00 - sick visits only Providers come to see babies  at [facility] Only accepting newborns of siblings and first time parents ONLY if who have met with office prior to delivery Medicaid -Yes; Tricare - yes  [facility] - [place], [place]; Spero Geralds, NP; Earlene Plater, MD; Lucretia Roers, MD:  [address], [place], [place] [address] [phone] Mon- Fri 8:00-5:00, Sat 9:00-12:00 - sick  visits only Accepting siblings of established patients and first time mom/baby Medicaid - Yes; Tricare - yes Patients must have vaccinations (baby vaccines)  [place] [phone] & [phone])  Adult nurse HealthCare at [facility] [address] [place], [place] [address] [phone] Mon-Fri 8:00-5:00 Medicaid - no; Tricare - yes  [facility] [place], MD; [place], [place]; [place], [place] [address], [place], [place] [address] [phone] Mon-Fri 8:00-5:00 Medicaid- yes; Tricare - yes  [facility] - Ardeen Jourdain, MD; Yetta Barre, NP; [place], [place] [address] [place], [place], [place] [address] [phone] Mon-Fri 8:00-5:00 Medicaid - Yes; Tricare - yes  [address] [place] [phone])  [facility] [place], [place]; [place], [place]; Eddie Candle, MD; Normand Sloop, MD; Kimmell, NP; Isenhour, DO; [place], [place]; Constance Goltz, MD; Ruthann Cancer, MD; Vear Clock, MD; [place], [place]; [place], [place]; [place], [place] 95[address] [facility] [address], [place], [place] [address] [phone] Mon-Fri 8:30-5:00, Sat 9:00-12:00 - sick only Please register online [email or web address] then schedule online or call office Medicaid-Yes; Tricare -yes  [facility] - [place]  [place], MD; Romualdo Bolk, MD; [place], MD; [place], NP; [place], [place]; Antonietta Barcelona, MD; Mayford Knife, NP; Shelva Majestic, MD [address], [place], [place] [address] [phone] Mon-Fri 8:00-5:30, Sat&Sun by appointment (phones open at 8:30) Medicaid - Yes; Tricare - yes  [place] [phone] & [phone]) [facility] Mariel Aloe; [place], MD; Roger Shelter, MD; Arvilla Market, NP; [place], DO [address], [place], [place] [address] [phone] M-F 8:00 - 5:15, Sat/Sun 9-12 sick visits only Medicaid - No; Tricare - yes  [facility]  [place], PA-C; [place], PA-C; [place], DO; [place], PA-C; [place], PA-C; Roselyn Bering, MD [address], [place], [place] [address] [phone] Mon-Thur 8:00-7:00, Fri 8:00-5:00 Accepting Medicaid for 13 and under only   [facility] - Family Medicine at [place] ([facility] [place]) [place], [place]; List, FNP; Berneda Rose, MD; Luther Redo, PA-C; Lavonia Drafts, MD; Kellie Simmering, FNP; Genevie Cheshire, FNP; Evaristo Bury, MD; Berneda Rose, MD [phone] [address] [place], [place] [address] [phone] Mon-Fri 8:30-5:30 Medicaid - Yes; Tricare - yes  [facility] Pediatrics - [address]  [place], ; Whitney Post, MD; Hennie Duos, MD; Wynne Dust, MD; DeRid[place], NP [address], [place], [place] [address] [phone] Mon-Thur 8:00-5:30, Fri 8:00-5:00, Sat 9:00-12:00 Medicaid - yes, Tricare - yes  [place] [phone])  [facility], [place]; Lenise Arena, MD; [place], [place] [address], [place], [place] [address] [phone] Mon-Fri 8:00-5:00, closed for lunch 12-1 Medicaid - No; Tricare - yes  Nature conservation officer at [facility], MD [address] [place], [place] [address] [phone] Mon-Fri 8:00-5:00 Medicaid - No; Tricare - yes  [facility], MD; Tami Ribas, MD; Mariam Dollar, MD; Yetta Barre, MD [address] [facility] [address], [place], [place] [address] [phone] Mon-Fri 8:00-5:00 Medicaid- Yes; Tricare - yes  [place] [phone])  Adult nurse HealthCare at [facility], [place]; [place], MD [address] [place], [place], [place] [address] [phone]  Mon-Fri 8:00-5:00 Medicaid - No; Tricare - yes  [facility] Medicine - [facility] - CPNP [address], [place], [place] [address] [phone] Mon-Weds 8:00-6:00, Thurs-Fri 8:00-5:00, Sat 9:00-12:00 Medicaid - yes; Tricare - yes   [facility] Katharina Caper, MD; [place], [place] [address] [place], [place] [address] [phone] Mon-Fri 8:00-5:00 Medicaid - yes; Tricare - yes  [facility]  Pediatric Providers  [facility] [address], [place], [place] [address] [phone] Sheral Flow: 8am -8pm, Tues, Weds: 8am - 5pm; Fri: 8-1 Medicaid - Yes; Tricare - yes  [facility] Rachel Bo, MD; Laural Benes, MD; Anner Crete, MD; [place], [place]; [place], [place] [address] [place], [place] [address] [phone] M-F 8:30 - 5:00 Medicaid - Call office; Tricare -yes  [facility] Edson Snowball, MD; Shanon Rosser, MD, Chelsea Primus, MD; Shirlyn Goltz, PNP; Wardell Heath, NP [phone] [address], [place], [place] [address] [phone] M-F 8:30 - 5:00, Sat/Sun 8:30 - 12:30 (sick visits) Medicaid - Call office; Tricare -yes  [facility] Melvyn Neth, MD; Karl Luke, PNP; Princess Bruins, MD; [place], [place]; [place], NP; Cynda Familia [address], [place], [place] [address] [phone] M-F 8:30 - 5:00 Medicaid - Call office; Tricare - yes  [facility] Jesusita Oka, MD; Dierdre Highman, MD; Earnest Conroy, MD; Timothy Lasso, MD; [place], MD [phone] [address], [place], [place] [address] [phone] M-Thur: 8:00 - 5:00; Fri: 8:00 - 4:00 Medicaid - yes; Tricare - yes  [facility] [address] [place], [place] [address] [phone] M-F: 8:30- 5:00, closed for lunch 12:30 - 1:00 Medicaid - yes; Tricare -yes  [facility] [address], [place], [place] [address] [phone] M-F 8:00 - 5:00 Medicaid - yes; Tricare - yes  [facility] [place], DO; [place], DO; McGregor, NP [address], [place], [place] [address] [phone] M-F 8:00 - 5:00, Closed 12-1 for lunch Medicaid - Call; Tricare - yes  [facility] Meredith Mody, MD [address] [place], [place] [address] [phone] M-F: 8:00-5:00, Sat: 8:00 - noon Medicaid - call; Tricare -yes  [facility] Pediatric Providers  [facility] [place], [place] [address] [place], [place], [place] [address] [phone] M-W: 8:00-5:00, Thur: 8:00 -  7:00, Fri: 8:00 - noon Medicaid - yes; Tricare - yes  [facility] - Quay Burow, FNP [address] [place], [place] [address] [phone] M-F 8:00 - 5:00, Closed for lunch 12-1 Medicaid - yes; Tricare - yes  [facility] Pediatric Providers  [facility] Primary Care at [place], [place], Alinda Money, MD, Canadian, FNP-C [address], [facility], [address], [place], [place] [address] [phone] M-T 8:00-5:00, Wed-Fri 7:00-6:00 Medicaid - Yes; Tricare -yes  [facility] Family [facility] at [facility], DO; [address] [place], [place] [address] [phone] M-F 8:00 - 5:00, closed for lunch 12-1 Medicaid - Yes; Tricare - yes  [facility] and Internal Medicine  Zachery Dauer, MD; Gladstone Lighter, MD; Collie Siad, MD; Freda Jackson, MD; Rich Number, MD; Darryl Nestle, MD; Melinda Crutch, MD, Audria Nine, MD; Tawanna Cooler, MD; Steffanie Dunn, MD; Byrd Hesselbach, MD; Lucretia Roers, MD [address] [place], [place] [address] [phone] M-F 8:00-5:00 Medicaid - yes; Tricare - yes  [facility] [place], MD (speaks [place] and Hindi) [address] [place], [place] [address] [phone] M-F: 8:30 - 5:00, closed 12:30 - 1 for lunch Medicaid - Yes; Tricare -yes  [facility] Pediatric Providers  [facility] Shanda Bumps, MD; Chanetta Marshall, MD; Laurell Josephs, MD  [address], [place], [place] [address] [phone] M-Th: 8:00 - 5:30, Fri: 8:00 - 12:00 Medicaid - yes; Tricare - yes  [facility] at [facility], NP; Thora Lance, MD; Orrin Brigham, MD [phone] [address], [place], [place] [address] [phone] M-F: 8:00 - 5:00 Medicaid - yes; Tricare - yes  [facility] Andrews, NP; Orson Slick, NP; Salley Scarlet, NP; Linton Flemings, MD; Mayford Knife, MD, Duque, NP, Emelda Fear, MD; Nida Boatman [address] [place], [place] [address] [phone] M-F: 8:30 - 5:30p Medicaid - yes; Tricare - yes Other locations available as well  [facility], MD; Andrey Campanile, MD; Neville Route, PA-C [address], [place], [place] [address] [phone] M-W: 8:00am - 7:00pm, Thurs: 8:00am - 8:00pm; Fri: 8:00am - 5:00pm, closed daily from 12-1 for lunch Medicaid - yes; Tricare - yes  [facility] Pediatric Providers  [facility] at Levin Erp, MD; Aggie Cosier, FNP; Bland Span, MD; Tristan Schroeder, MD; [place], PNP; Alesia Banda; [place], [place]; Julian Reil, MD;  [address], [place], [place] [address] [phone] Judie Petit - Caleen [place]: 8am - 5pm, Sat 9-noon Medicaid - Yes; Tricare -yes  [facility] at Jaclynn Guarneri, MD; Yetta Barre, FNP; Lilian Kapur, MD; Mariam Dollar, MD [address] [place], [place] [phone] M-F 8:00 - 5:00 Medicaid - call; Tricare - yes  [facility]- Cruz Condon, MD; [place], [place]; Delora Fuel, MD; Dareen Piano, MD; Trudee Grip, MD; Kizzie Ide, MD; Zebedee Iba; Birdena Crandall, MD; Hinton Dyer, MD; [place], MD [address] [place], [place] [address] [phone] M-F 8:00am - 5:00pm; Sat. 9:00 - 11:00 Medicaid - yes; Tricare - yes  [facility] at [facility], MD [address], [place], [place] [address] [phone] M-F 8:00 - 5:00 Medicaid - [place] Medicaid only; Tricare - yes  [facility]s - Illene Bolus, MD; Earlene Plater, [place]; Kenyon Ana, MD [address] [place], [place] [address] [phone] M-F 8:00 - 5:00 Medicaid - yes; Tricare - yes  [facility] [address] Pediatrics - Lind Covert, MD; Manson Passey, MD, [facility], MD, [place], MD; [place], MD; Katrinka Blazing, MD; [address] [place], [place] [address] [phone] M-F: 8-5 Medicaid - yes; Tricare - yes  [facility] - Henrietta Hoover, [place]; [place], MD; [address], [place], [place] [address] [phone] M-F 8-5 Medicaid - yes; Tricare - yes  [address] [place] Darrol Poke, MD; Tami Ribas, MD; [place], MD; [place], DNP;  [place], PNP [address], [place], [place] [address] [phone] M-F 8-5 Medicaid - yes; Tricare - yes  [facility] and Pediatrics Delories Heinz, MD; Adrienne Mocha; Ala Bent, MD [address] [place], [place] [address] [phone] M-F 7am - 5 pm Medicaid - call; Tricare - yes  [facility] [place], [place]; Fredia Beets, MD; Roxan Hockey, MD [address] [place], [place] [address] [phone] M-F 8-5 Medicaid - yes; Tricare - yes  [facility] Kae Heller, MD; Sheliah Hatch, MD; Mayford Knife, FNP; Shon Baton, FNP; Tyron Russell, FNP; Ishmael Holter; [facility] - FNP [address], [place], [place] [address] [phone] M-F 8-5 Medicaid- yes; Tricare - yes  [facility] - Betsy Coder, [place] and Chalmers Guest, MD; Terrial Rhodes, MD; Hulda Humphrey, MD; Roseanne Reno, MD; [place], [place]; Ala Dach, MD; Fredia Beets, MD; Dimple Casey, MD [address] [place], [place] [address] [phone] M-F: 8-5, Sat: 9-4, Sun 9-12 Medicaid - yes; Tricare - yes  [facility] [place], PNP; [place], PNP [address] [place], [place], [place]  [address] [phone] M-F 8 - 5, closed 12-1 for lunch Medicaid - yes; Tricare - yes  [facility] Kathyrn Lass, MD; Hal Neer, MD; Dimple Casey, MD; [place], DO [address] [place], [place] [address] [phone] M-F 8- 5:30 Medicaid - yes; Tricare - yes  [facility]s - Biagio Quint, MD; Rosalia Hammers, MD; Gwenith Daily, MD [address], [place], [place] [address] [phone] Judie Petit: Nicholas Lose; Tues-Fri: 8-5; Sat: 9-12 Medicaid - yes; Tricare - yes  [facility]s - Bobbye Morton, MD; Daphane Shepherd, MD; Chestine Spore, MD; Haskell Riling, MD; Kate Sable, MD [address], [place], [place] [address] [phone] Judie PetitMarland Kitchen Nicholas LoseFrancee Nodal: 8-5; Sat: 8:30-12:30 Medicaid - yes;  Tricare - yes  [facility]lena Heckle [facility] Pediatrics - Beckey Rutter, MD; [place], [place] [address] [place], [place] [address] [phone] Mon-Fri: 8-5 Medicaid - yes; Tricare - yes  [facility] Pediatrics - [place], CPNP; [place], [place]; Dimple Casey, MD; Alisa Graff, MD; Cephus Shelling, MD; [address], [place], [place] [address] [phone] M-F: 8-5, closed 1-2 for lunch Medicaid - yes; Tricare - yes  [facility] - [facility] [place], [place]; [place], [place]; Katrinka Blazing, MD; [place], CPNP; [place], [place]; [place], MD; Earlene Plater, MD [address], [place], [place] [address] [phone] M-Thurs: Nicholas Lose; Fri: 8-6; Sat: 9-12; Sun 2-4 Medicaid - yes; Tricare - yes  [facility] Georgeanna Lea, MD; Evette Cristal, MD; Shea Stakes, FNP; Earney Mallet, DO; [address], [place], [place] [address] [phone] M-F: 8-5 Medicaid - yes; Tricare - yes  [facility] Pediatric Providers  [facility] - Family Medicine -Collene Mares, MD; [place], NP [address] [place], [place] [address] [phone] M - Fri: 8am - 5pm, closed for lunch 12-1 Medicaid - Yes; Tricare - yes  [facility] Elinor Parkinson, MD; Victory Dakin, MD; [place], DO; Vinocur, MD;Hall, PA; Clent Ridges, [place]; Orvan Falconer, NP [phone] [address], [place], [place] [address] [phone] M-F 8:00 - 5:00, Sat 8:00 - 11:30 Medicaid - yes; Tricare - yes  [facility] Welton Flakes, MD; [place], MD, [address] MD, [place], MD, [place], MD; [place]ock, NP; [place], [place];  [address], [place], [place] [address] [phone] M-F 8:10am - 5:00pm Medicaid - yes; Tricare - yes  [facility] Eustaquio Boyden, MD; [place], NP [address] [address], [place], [place] [address] [phone] M-F 8:00 - 5:00 Medicaid - [place] Medicaid only; Tricare - yes  [facility] [address] [place], MD; [place], NP [address], [place], [place] [address] [phone] M-F 8:00 - 5:00; Closed for lunch 12 - 1:00 Medicaid - yes; Tricare - yes  [facility] Belva Crome, MD; Jonita Albee, FNP [address] [place], [place] [address] [phone] Mon 9-5; Tues/Wed 10-5; Thurs 8:30-5; Fri: 8-12:30 Medicaid - yes; Tricare - yes  [facility] Pediatric Providers  [facility]  [place], MD; [place], [place] [address] [place], [place] [address] [phone] phone [phone] fax M-F 7:15 - 4:30 Medicaid - yes; Tricare - yes  [place] [facility] Karilyn Cota, MD; [place], DO [address] [place], [place] [address] [phone] M-Fri: 8:30 - 5:00, closed for lunch everyday noon - 1pm Medicaid - Yes; Tricare - yes  [facility] Bur[place], MD; Reuel Boom, MD; Dimas Aguas, MD; Neita Carp, MD; [place], [place]; Bonnita Nasuti, [place]; [place], [place]; [place], [place];  [place], PA [address] [place], [place] [address] [phone] M-Thurs: 7:30am - 7:00pm; Friday 7:30am - 4pm; Sat: 8:00 - 1:00 Medicaid - Yes; Tricare - yes  [place] [facility] Morton, MD; Conni Elliot, MD; Carroll Kinds, MD; [place], DO [address], [place], [place] [address] [phone] M-Thur: 8:00 - 5:00, Fri: 8:00 - Noon Medicaid - yes; Tricare - yes No [place] [employer]  [place] [facility] [place], MD; Nadine Counts, DO; [place], NP; Daphine Deutscher, NP; Lequita Halt, NP; Ellamae Sia, NP; Reginia Forts, NP; Darlyn Read, MD; [place], [place] [address] [place], [place] [address] [phone] M-F 8:00 - 5:00 Medicaid - yes; Tricare - yes  [facility], FNP-C; [place], FNP-C [address] [place], [place] [address] [phone] M, W, R 8:00-5:00, Tues: 8:00am - 7:00pm; Fri 8:00 - noon Medicaid - Yes; Tricare - yes  [facility], MD [address] [place], [place] [address] [phone]  M-Thurs 8:30-5:30, Fri: 8:30-12:30pm Medicaid - Yes; Tricare - N    Considering Waterbirth? Guide for patients at Center for [employer] [facility]) Why consider waterbirth? Gentle birth for babies  Less pain medicine used in labor  May allow for passive descent/less pushing  May reduce perineal tears  More mobility and instinctive maternal position changes  Increased maternal relaxation   Is waterbirth safe? What are the risks of infection, drowning or other complications? Infection:  Very low risk (3.7 % for tub vs 4.8% for bed)  7 in 8000 waterbirths with documented infection  Poorly cleaned equipment most common cause  Slightly lower group B strep transmission rate  Drowning  Maternal:  Very low risk  Related to seizures or fainting  Newborn:  Very low risk. No evidence of increased risk of respiratory problems in multiple large studies  Physiological protection from breathing under water  Avoid underwater birth if there are any fetal complications  Once baby's head is out of the water, keep it out.  Birth complication  Some reports of cord trauma, but risk decreased by bringing baby to surface gradually  No evidence of increased risk of shoulder dystocia. Mothers can usually change positions faster in water than in a bed, possibly aiding the maneuvers to free the shoulder.   There are 2 things you MUST do to have a waterbirth with [facility]: Attend a waterbirth class at [facility]   3rd Wednesday of every month from 7-9 pm (virtual during COVID) [employer] at [email or web address] or [email or web address] or by calling [phone] Bring [place] the certificate from the class to your prenatal appointment or send via MyChart Meet with a midwife at 36 weeks* to see if you can still plan a waterbirth and to sign the consent.   *We also recommend that you schedule as many of your prenatal  visits with a midwife as possible.    Helpful information: You may want to bring a bathing suit top to the hospital to wear during labor but this is optional.  All other supplies are provided by the hospital. Please arrive at the hospital with signs of active labor, and do not wait at home until late in labor. It takes 45 min- 1 hour for fetal monitoring, and check in to your room to take place, plus transport and filling of the waterbirth tub.    Things that would prevent you from having a waterbirth: Premature, <37wks  Previous  cesarean birth  Presence of thick meconium-stained fluid  Multiple gestation (Twins, triplets, etc.)  Uncontrolled diabetes or gestational diabetes requiring medication  Hypertension diagnosed in pregnancy or preexisting hypertension (gestational hypertension, preeclampsia, or chronic hypertension) Fetal growth restriction (your baby measures less than 10th percentile on ultrasound) Heavy vaginal bleeding  Non-reassuring fetal heart rate  Active infection (MRSA, etc.). Group B Strep is NOT a contraindication for waterbirth.  If your labor has to be induced and induction method requires continuous monitoring of the baby's heart rate  Other risks/issues identified by your obstetrical provider   Please remember that birth is unpredictable. Under certain unforeseeable circumstances your provider may advise against giving birth in the tub. These decisions will be made on a case-by-case basis and with the safety of you and your baby as our highest priority.    Updated 05/09/21    CenteringPregnancy is a model of prenatal care that started 30 years ago and is used in about 600 practices around the [place]. You meet with a group of 8-12 women due around the same time as you. In Centering you will have individual time with the provider and meet as a group. There's much more time for discussion and learning. You will actually have much more time with your provider in Centering  than in traditional prenatal care.? You will come directly into the Centering room and will not wait in the lobby so there is no wasted time. You will have 2-hour visits every 4 weeks then every 2 weeks. You will know your Centering prenatal appointments in advance. In your last month of pregnancy, you may also come in for some individual visits. Additional appointments can be scheduled if you need more care. Studies have shown that CenteringPregnancy improves birth outcomes. We have seen especially big improvements in fewer Black women delivering babies who are too small or born too early. Visit the website CenteringHealthcare for more information. Let your provider or clinic staff know if you want to sign up or email [email or web address] for more information.   CenteringPregnancy Video

## 2023-03-04 NOTE — Progress Notes (Signed)
New OB Intake  I connected with Christie Parker  on 03/04/23 at  9:15 AM EST by MyChart Video Visit and verified that I am speaking with the correct person using two identifiers. Nurse is located at [facility] and pt is located at home.  I discussed the limitations, risks, security and privacy concerns of performing an evaluation and management service by telephone and the availability of in person appointments. I also discussed with the patient that there may be a patient responsible charge related to this service. The patient expressed understanding and agreed to proceed.  I explained I am completing New OB Intake today. We discussed EDD of 09/28/2023, by Last Menstrual Period. Pt is G3P2002. I reviewed her allergies, medications and Medical/Surgical/OB history.    Patient Active Problem List   Diagnosis Date Noted   Supervision of high risk pregnancy, antepartum 03/04/2023    Concerns addressed today  Delivery Plans Plans to deliver at [facility]. Discussed the nature of our practice with multiple providers including residents and students. Due to the size of the practice, the delivering provider may not be the same as those providing prenatal care.   Patient  may be  interested in water birth. Offered upcoming OB visit with CNM to discuss further.  MyChart/Babyscripts MyChart access verified. I explained pt will have some visits in office and some virtually. Babyscripts instructions given and order placed. Patient verifies receipt of registration text/e-mail. Account successfully created and app downloaded. If patient is a candidate for Optimized scheduling, add to sticky note.   Blood Pressure Cuff/Weight Scale Blood pressure cuff ordered for patient to pick-up from [employer]. Explained after first prenatal appt pt will check weekly and document in Babyscripts.  Anatomy US Explained first scheduled [place] will be around 19 weeks. Anatomy US scheduled for 05/05/2023 at 8:15am.  Is patient a  CenteringPregnancy candidate?  Declined Declined due to Childcare   Is patient a Mom+Baby Combined Care candidate?  Not a candidate   If accepted, confirm patient does not intend to move from the area for at least 12 months, then notify Mom+Baby staff  Interested in [place]? If yes, send referral and doula dot phrase.   Is patient a candidate for Babyscripts Optimization? Offer!   First visit review I reviewed new OB appt with patient. Explained pt will be seen by Albertine Grates, FNP at first visit. Discussed Avelina Laine genetic screening with patient.  Panorama and Horizon.. Routine prenatal labs is needed at new ob visit.    Last Pap Diagnosis  Date Value Ref Range Status  05/25/2020   Final   - Negative for intraepithelial lesion or malignancy (NILM)    B'Aisha T Marilynn Ekstein, CMA 03/04/2023  9:43 AM

## 2023-03-06 ENCOUNTER — Ambulatory Visit
Admission: RE | Admit: 2023-03-06 | Discharge: 2023-03-06 | Disposition: A | Discharge status: Home / Self Care | Payer: Medicaid Other | Source: Ambulatory Visit | Attending: Obstetrics and Gynecology | Admitting: Obstetrics and Gynecology

## 2023-03-06 DIAGNOSIS — Z3687 Encounter for antenatal screening for uncertain dates: Secondary | ICD-10-CM | POA: Diagnosis not present

## 2023-03-06 DIAGNOSIS — Z3A10 10 weeks gestation of pregnancy: Secondary | ICD-10-CM | POA: Diagnosis not present

## 2023-03-06 DIAGNOSIS — O0990 Supervision of high risk pregnancy, unspecified, unspecified trimester: Secondary | ICD-10-CM | POA: Diagnosis present

## 2023-03-11 ENCOUNTER — Ambulatory Visit (INDEPENDENT_AMBULATORY_CARE_PROVIDER_SITE_OTHER): Payer: Medicaid Other | Admitting: Obstetrics and Gynecology

## 2023-03-11 ENCOUNTER — Other Ambulatory Visit: Payer: Self-pay

## 2023-03-11 ENCOUNTER — Other Ambulatory Visit (HOSPITAL_COMMUNITY)
Admission: RE | Admit: 2023-03-11 | Discharge: 2023-03-11 | Disposition: A | Discharge status: Home / Self Care | Payer: Medicaid Other | Source: Ambulatory Visit | Attending: Obstetrics and Gynecology | Admitting: Obstetrics and Gynecology

## 2023-03-11 VITALS — BP 115/65 | HR 85 | Wt 174.0 lb

## 2023-03-11 DIAGNOSIS — O0991 Supervision of high risk pregnancy, unspecified, first trimester: Secondary | ICD-10-CM | POA: Diagnosis not present

## 2023-03-11 DIAGNOSIS — Z3481 Encounter for supervision of other normal pregnancy, first trimester: Secondary | ICD-10-CM | POA: Diagnosis not present

## 2023-03-11 DIAGNOSIS — Z31430 Encounter of female for testing for genetic disease carrier status for procreative management: Secondary | ICD-10-CM | POA: Diagnosis not present

## 2023-03-11 DIAGNOSIS — Z8759 Personal history of other complications of pregnancy, childbirth and the puerperium: Secondary | ICD-10-CM

## 2023-03-11 DIAGNOSIS — Z3400 Encounter for supervision of normal first pregnancy, unspecified trimester: Secondary | ICD-10-CM

## 2023-03-11 DIAGNOSIS — Z23 Encounter for immunization: Secondary | ICD-10-CM

## 2023-03-11 DIAGNOSIS — Z1332 Encounter for screening for maternal depression: Secondary | ICD-10-CM

## 2023-03-11 DIAGNOSIS — Z3A11 11 weeks gestation of pregnancy: Secondary | ICD-10-CM

## 2023-03-11 DIAGNOSIS — O0990 Supervision of high risk pregnancy, unspecified, unspecified trimester: Secondary | ICD-10-CM

## 2023-03-11 HISTORY — DX: Personal history of other complications of pregnancy, childbirth and the puerperium: Z87.59

## 2023-03-11 MED ORDER — ASPIRIN 81 MG PO TBEC
81.0000 mg | DELAYED_RELEASE_TABLET | Freq: Every day | ORAL | 2 refills | Status: DC
Start: 2023-03-11 — End: 2023-07-05

## 2023-03-11 MED ORDER — ONDANSETRON 4 MG PO TBDP: 4.0000 mg | ORAL_TABLET | Freq: Four times a day (QID) | ORAL | 0 refills | Status: DC | PRN

## 2023-03-11 NOTE — Progress Notes (Signed)
 INITIAL PRENATAL VISIT  Subjective:   Christie Parker is being seen today for her first obstetrical visit. She is at [redacted]w[redacted]d gestation by LMP. Her obstetrical history is significant for  hx of preeclampsia . Relationship with FOB: spouse, living together. Patient does intend to breast feed. Pregnancy history fully reviewed.  Patient reports  nausea .  Indications for ASA therapy (per uptodate) One of the following: Previous pregnancy with preeclampsia, especially early onset and with an adverse outcome Yes Multifetal gestation No Chronic hypertension No Type 1 or 2 diabetes mellitus No Chronic kidney disease No Autoimmune disease (antiphospholipid syndrome, systemic lupus erythematosus) No  Objective:    Obstetric History OB History  Gravida Para Term Preterm AB Living  3 2 2   2   SAB IAB Ectopic Multiple Live Births     0 2    # Outcome Date GA Lbr Len/2nd Weight Sex Type Anes PTL Lv  3 Current           2 Term 11/12/20 [redacted]w[redacted]d 19:45 / 00:45 7 lb 10.9 oz (3.485 kg) M Vag-Spont EPI  LIV     Birth Comments: WNL  1 Term 08/02/19 [redacted]w[redacted]d 04:19 / 00:10 4 lb 13.6 oz (2.2 kg) F Vag-Spont EPI, Local  LIV    Past Medical History:  Diagnosis Date   Gestational hypertension     Past Surgical History:  Procedure Laterality Date   NO PAST SURGERIES      Current Outpatient Medications on File Prior to Visit  Medication Sig Dispense Refill   Prenatal Vit-Fe Fumarate-FA (PRENATAL VITAMIN) 27-0.8 MG TABS Take 1 tablet by mouth daily. 30 tablet 6   Blood Pressure Monitoring (BLOOD PRESSURE KIT) DEVI 1 each by Does not apply route as needed. 1 each 0   No current facility-administered medications on file prior to visit.    No Known Allergies  Social History:  reports that she has never smoked. She has never used smokeless tobacco. She reports that she does not drink alcohol and does not use drugs.  Family History  Problem Relation Age of Onset   Hypertension Mother     The  following portions of the patient's history were reviewed and updated as appropriate: allergies, current medications, past family history, past medical history, past social history, past surgical history and problem list.  Review of Systems Review of Systems    Physical Exam:  BP 115/65   Pulse 85   Wt 174 lb (78.9 kg)   LMP 12/22/2022   BMI 31.83 kg/m  CONSTITUTIONAL: Well-developed, well-nourished female in no acute distress.  HENT:  Normocephalic, atraumatic.  EYES: Conjunctivae normal.  NECK: Normal range of motion,  SKIN: Skin is warm and dry.  MUSCULOSKELETAL: Normal range of motion. No tenderness.  No cyanosis, clubbing, or edema.   NEUROLOGIC: Alert and oriented PSYCHIATRIC: Normal mood and affect. Normal behavior. Normal judgment and thought content. CARDIOVASCULAR: Normal heart rate noted, regular rhythm RESPIRATORY: normal effort ABDOMEN: Soft PELVIC:deferred      Movement: Present       Assessment:    Pregnancy: G3P2002  1. Encounter for supervision of low-risk first pregnancy, antepartum (Primary) BP and FHR normal Doing well overall Flu vaccine given today   - Flu vaccine trivalent PF, 6mos and older(Flulaval,Afluria,Fluarix,Fluzone) - CBC/D/Plt+RPR+Rh+ABO+RubIgG... - Culture, OB Urine - GC/Chlamydia probe amp ([place])not at [facility] - Hemoglobin A1c  - ondansetron  (ZOFRAN -ODT) 4 MG disintegrating tablet; Take 1 tablet (4 mg total) by mouth every 6 (six) hours as needed  for nausea.  Dispense: 20 tablet; Refill: 0 - Flu vaccine trivalent PF, 6mos and older(Flulaval,Afluria,Fluarix,Fluzone)  2. History of pre-eclampsia Induced at 37 weeks for pre-e with first pregnancy, no BP problems second baby Discussed recommendation for aspirin  in pregnancy  - aspirin  EC 81 MG tablet; Take 1 tablet (81 mg total) by mouth daily. Start taking when you are [redacted] weeks pregnant for rest of pregnancy for prevention of preeclampsia  Dispense: 300 tablet; Refill: 2 - Comp  Met (CMET) - Protein / creatinine ratio, urine  3. History of prior pregnancy with IUGR newborn First baby IUGR, no problem second delivery   4. [redacted] weeks gestation of pregnancy  5. Encounter of female for testing for genetic disease carrier status for procreative management  - HORIZON CUSTOM - PANORAMA PRENATAL TEST     Plan:     Initial labs drawn. Prenatal vitamins. Problem list reviewed and updated. Reviewed in detail the nature of the practice with collaborative care between  Genetic screening discussed: NIPS/First trimester screen/Quad/AFP declined. Role of ultrasound in pregnancy discussed; Anatomy US : ordered.  Follow up in 4 weeks. Discussed clinic routines, schedule of care and testing, genetic screening options, involvement of students and residents under the direct supervision of APPs and doctors and presence of female providers. Pt verbalized understanding.  Return in 4 week for routine prenatal   Future Appointments  Date Time Provider Department Center  04/08/2023  8:15 AM Cleatus Moccasin, MD [facility]  05/05/2023  8:15 AM [facility] NURSE [facility]  05/05/2023  8:30 AM [facility] US5 [facility]    Delores Nidia CROME, FNP

## 2023-03-12 LAB — CBC/D/PLT+RPR+RH+ABO+RUBIGG...
Antibody Screen: NEGATIVE
Basophils Absolute: 0 10*3/uL (ref 0.0–0.2)
Basos: 1 %
EOS (ABSOLUTE): 0.1 10*3/uL (ref 0.0–0.4)
Eos: 1 %
HCV Ab: NONREACTIVE
HIV Screen 4th Generation wRfx: NONREACTIVE
Hematocrit: 38.8 % (ref 34.0–46.6)
Hemoglobin: 12.8 g/dL (ref 11.1–15.9)
Hepatitis B Surface Ag: NEGATIVE
Immature Grans (Abs): 0 10*3/uL (ref 0.0–0.1)
Immature Granulocytes: 0 %
Lymphocytes Absolute: 2.1 10*3/uL (ref 0.7–3.1)
Lymphs: 27 %
MCH: 30.1 pg (ref 26.6–33.0)
MCHC: 33 g/dL (ref 31.5–35.7)
MCV: 91 fL (ref 79–97)
Monocytes Absolute: 0.7 10*3/uL (ref 0.1–0.9)
Monocytes: 9 %
Neutrophils Absolute: 4.9 10*3/uL (ref 1.4–7.0)
Neutrophils: 62 %
Platelets: 303 10*3/uL (ref 150–450)
RBC: 4.25 x10E6/uL (ref 3.77–5.28)
RDW: 12.3 % (ref 11.7–15.4)
RPR Ser Ql: NONREACTIVE
Rh Factor: POSITIVE
Rubella Antibodies, IGG: 0.91 {index} — ABNORMAL LOW (ref >0.99)
WBC: 7.9 10*3/uL (ref 3.4–10.8)

## 2023-03-12 LAB — COMPREHENSIVE METABOLIC PANEL
ALT: 14 [IU]/L (ref 0–32)
AST: 12 [IU]/L (ref 0–40)
Albumin: 4.1 g/dL (ref 4.0–5.0)
Alkaline Phosphatase: 62 [IU]/L (ref 44–121)
BUN/Creatinine Ratio: 13 (ref 9–23)
BUN: 7 mg/dL (ref 6–20)
Bilirubin Total: 0.3 mg/dL (ref 0.0–1.2)
CO2: 20 mmol/L (ref 20–29)
Calcium: 9.5 mg/dL (ref 8.7–10.2)
Chloride: 104 mmol/L (ref 96–106)
Creatinine, Ser: 0.54 mg/dL — ABNORMAL LOW (ref 0.57–1.00)
Globulin, Total: 2.9 g/dL (ref 1.5–4.5)
Glucose: 77 mg/dL (ref 70–99)
Potassium: 3.8 mmol/L (ref 3.5–5.2)
Sodium: 139 mmol/L (ref 134–144)
Total Protein: 7 g/dL (ref 6.0–8.5)
eGFR: 129 mL/min/{1.73_m2} (ref >59)

## 2023-03-12 LAB — HEMOGLOBIN A1C
Est. average glucose Bld gHb Est-mCnc: 91 mg/dL
Hgb A1c MFr Bld: 4.8 % (ref 4.8–5.6)

## 2023-03-12 LAB — GC/CHLAMYDIA PROBE AMP (~~LOC~~) NOT AT ARMC
Chlamydia: NEGATIVE
Comment: NEGATIVE
Comment: NORMAL
Neisseria Gonorrhea: NEGATIVE

## 2023-03-12 LAB — HCV INTERPRETATION

## 2023-03-13 LAB — PROTEIN / CREATININE RATIO, URINE
Creatinine, Urine: 75.7 mg/dL
Protein, Ur: 8.1 mg/dL
Protein/Creat Ratio: 107 mg/g{creat} (ref 0–200)

## 2023-03-13 LAB — CULTURE, OB URINE

## 2023-03-13 LAB — URINE CULTURE, OB REFLEX

## 2023-03-14 ENCOUNTER — Encounter: Payer: Self-pay | Admitting: *Deleted

## 2023-03-16 ENCOUNTER — Encounter: Payer: Self-pay | Admitting: Obstetrics and Gynecology

## 2023-03-16 DIAGNOSIS — O09899 Supervision of other high risk pregnancies, unspecified trimester: Secondary | ICD-10-CM | POA: Insufficient documentation

## 2023-03-16 LAB — PANORAMA PRENATAL TEST FULL PANEL:PANORAMA TEST PLUS 5 ADDITIONAL MICRODELETIONS: FETAL FRACTION: 9.3

## 2023-03-18 LAB — HORIZON CUSTOM: REPORT SUMMARY: NEGATIVE

## 2023-04-08 ENCOUNTER — Encounter: Payer: Self-pay | Admitting: Obstetrics and Gynecology

## 2023-04-08 ENCOUNTER — Ambulatory Visit: Payer: Medicaid Other | Admitting: Obstetrics and Gynecology

## 2023-04-08 ENCOUNTER — Other Ambulatory Visit: Payer: Self-pay

## 2023-04-08 VITALS — BP 112/71 | HR 96 | Wt 174.0 lb

## 2023-04-08 DIAGNOSIS — Z3A15 15 weeks gestation of pregnancy: Secondary | ICD-10-CM | POA: Diagnosis not present

## 2023-04-08 DIAGNOSIS — O09892 Supervision of other high risk pregnancies, second trimester: Secondary | ICD-10-CM | POA: Diagnosis not present

## 2023-04-08 DIAGNOSIS — Z8759 Personal history of other complications of pregnancy, childbirth and the puerperium: Secondary | ICD-10-CM

## 2023-04-08 DIAGNOSIS — O0990 Supervision of high risk pregnancy, unspecified, unspecified trimester: Secondary | ICD-10-CM

## 2023-04-08 DIAGNOSIS — O0992 Supervision of high risk pregnancy, unspecified, second trimester: Secondary | ICD-10-CM | POA: Diagnosis not present

## 2023-04-08 DIAGNOSIS — Z3400 Encounter for supervision of normal first pregnancy, unspecified trimester: Secondary | ICD-10-CM

## 2023-04-08 DIAGNOSIS — Z2839 Other underimmunization status: Secondary | ICD-10-CM

## 2023-04-08 MED ORDER — ONDANSETRON 4 MG PO TBDP: 4.0000 mg | ORAL_TABLET | Freq: Four times a day (QID) | ORAL | 0 refills | Status: DC | PRN

## 2023-04-08 MED ORDER — FAMOTIDINE 20 MG PO TABS: 20.0000 mg | ORAL_TABLET | Freq: Two times a day (BID) | ORAL | 1 refills | Status: DC

## 2023-04-08 NOTE — Progress Notes (Signed)
   PRENATAL VISIT NOTE  Subjective:  Christie Parker is a 28 y.o. G3P2002 at [redacted]w[redacted]d being seen today for ongoing prenatal care.  She is currently monitored for the following issues for this high-risk pregnancy and has Supervision of high risk pregnancy, antepartum; History of pre-eclampsia; History of prior pregnancy with IUGR newborn; and Rubella non-immune status, antepartum on their problem list.  Patient reports  nausea continued but zofran helps. Has reflux at night .  Contractions: Not present. Vag. Bleeding: None.  Movement: Absent. Denies leaking of fluid.   The following portions of the patient's history were reviewed and updated as appropriate: allergies, current medications, past family history, past medical history, past social history, past surgical history and problem list.   Objective:   Vitals:   04/08/23 0832  BP: 112/71  Pulse: 96  Weight: 174 lb (78.9 kg)    Fetal Status: Fetal Heart Rate (bpm): 147   Movement: Absent     General:  Alert, oriented and cooperative. Patient is in no acute distress.  Skin: Skin is warm and dry. No rash noted.   Cardiovascular: Normal heart rate noted  Respiratory: Normal respiratory effort, no problems with respiration noted  Abdomen: Soft, gravid, appropriate for gestational age.  Pain/Pressure: Absent     Pelvic: Cervical exam deferred        Extremities: Normal range of motion.  Edema: None  Mental Status: Normal mood and affect. Normal behavior. Normal judgment and thought content.   Assessment and Plan:  Pregnancy: G3P2002 at [redacted]w[redacted]d 1. Rubella non-immune status, antepartum (Primary) MMR after delivery  2. History of prior pregnancy with IUGR newborn Check third tri growth  3. History of pre-eclampsia Continue ldASA  4. Supervision of high risk pregnancy, antepartum Check MSAFP next visit.  NIPS wnl. Horizon neg x4.  Anatomy scheduled for 3/31.  Pepcid for reflux.  Refilled zofran.   5. Pregnancy with 15 completed weeks  gestation   Preterm labor symptoms and general obstetric precautions including but not limited to vaginal bleeding, contractions, leaking of fluid and fetal movement were reviewed in detail with the patient. Please refer to After Visit Summary for other counseling recommendations.   Return in about 4 weeks (around 05/06/2023) for OB VISIT, MD or APP.  Future Appointments  Date Time Provider Department Center  05/05/2023  8:15 AM [facility] NURSE [facility]  05/05/2023  8:30 AM [facility] US5 [facility]    Milas Hock, MD

## 2023-04-17 ENCOUNTER — Other Ambulatory Visit: Payer: Self-pay | Admitting: Lactation Services

## 2023-04-17 DIAGNOSIS — O0990 Supervision of high risk pregnancy, unspecified, unspecified trimester: Secondary | ICD-10-CM

## 2023-04-17 MED ORDER — FAMOTIDINE 20 MG PO TABS: 20.0000 mg | ORAL_TABLET | Freq: Two times a day (BID) | ORAL | 0 refills | Status: AC

## 2023-04-24 DIAGNOSIS — O99210 Obesity complicating pregnancy, unspecified trimester: Secondary | ICD-10-CM | POA: Insufficient documentation

## 2023-05-05 ENCOUNTER — Ambulatory Visit: Payer: Medicaid Other | Admitting: *Deleted

## 2023-05-05 ENCOUNTER — Ambulatory Visit: Payer: Medicaid Other | Attending: Obstetrics and Gynecology

## 2023-05-05 ENCOUNTER — Encounter: Payer: Self-pay | Admitting: *Deleted

## 2023-05-05 ENCOUNTER — Ambulatory Visit (HOSPITAL_BASED_OUTPATIENT_CLINIC_OR_DEPARTMENT_OTHER): Admitting: Obstetrics

## 2023-05-05 ENCOUNTER — Other Ambulatory Visit: Payer: Self-pay | Admitting: *Deleted

## 2023-05-05 VITALS — BP 132/76 | HR 94

## 2023-05-05 DIAGNOSIS — O99212 Obesity complicating pregnancy, second trimester: Secondary | ICD-10-CM

## 2023-05-05 DIAGNOSIS — E669 Obesity, unspecified: Secondary | ICD-10-CM

## 2023-05-05 DIAGNOSIS — O0990 Supervision of high risk pregnancy, unspecified, unspecified trimester: Secondary | ICD-10-CM | POA: Insufficient documentation

## 2023-05-05 DIAGNOSIS — O09292 Supervision of pregnancy with other poor reproductive or obstetric history, second trimester: Secondary | ICD-10-CM

## 2023-05-05 DIAGNOSIS — O359XX0 Maternal care for (suspected) fetal abnormality and damage, unspecified, not applicable or unspecified: Secondary | ICD-10-CM | POA: Diagnosis present

## 2023-05-05 DIAGNOSIS — O99210 Obesity complicating pregnancy, unspecified trimester: Secondary | ICD-10-CM | POA: Diagnosis present

## 2023-05-05 DIAGNOSIS — Z3A18 18 weeks gestation of pregnancy: Secondary | ICD-10-CM

## 2023-05-05 DIAGNOSIS — Z3A19 19 weeks gestation of pregnancy: Secondary | ICD-10-CM | POA: Insufficient documentation

## 2023-05-05 DIAGNOSIS — Z362 Encounter for other antenatal screening follow-up: Secondary | ICD-10-CM

## 2023-05-05 NOTE — Progress Notes (Signed)
 MFM Consult Note  Christie Parker is currently at 18 weeks and 4 days.  She was seen due to maternal obesity with a BMI of 31.  She has a prior pregnancy that was complicated by IUGR.  She denies any problems in her current pregnancy.    She had a cell free DNA test earlier in her pregnancy which indicated a low risk for trisomy 57, 60, and 13. A female fetus is predicted.   She was informed that the fetal growth and amniotic fluid level were appropriate for her gestational age.   There were no obvious fetal anomalies noted on today's ultrasound exam.  A fetal arrhythmia, most likely due to PACs was noted on today's exam.  The patient was advised that PACs are usually benign and will resolve either later in her pregnancy or after birth.  We will consider referring her for a fetal echocardiogram should the fetal arrhythmia continue to be noted at her next ultrasound exam.  The patient was informed that anomalies may be missed due to technical limitations. If the fetus is in a suboptimal position or maternal habitus is increased, visualization of the fetus in the maternal uterus may be impaired.  The assessment of the fetal anatomy were based on a review of the images obtained by the sonographer.  I did not scan her myself due to her religious beliefs.  Due to her history of IUGR, we will continue to follow her with growth ultrasounds throughout her pregnancy.    A follow-up exam was scheduled in 4 weeks.    The patient stated that all of her questions were answered today.  A total of 30 minutes was spent counseling and coordinating the care for this patient.  Greater than 50% of the time was spent in direct face-to-face contact.

## 2023-05-08 NOTE — Progress Notes (Unsigned)
   PRENATAL VISIT NOTE  Subjective:  Christie Parker is a 28 y.o. G3P2002 at [redacted]w[redacted]d being seen today for ongoing prenatal care.  She is currently monitored for the following issues for this low-risk pregnancy and has Supervision of high risk pregnancy, antepartum; History of pre-eclampsia; History of prior pregnancy with IUGR newborn; Rubella non-immune status, antepartum; and Obesity affecting pregnancy, antepartum on their problem list.  Patient reports {[id number]}.   .  .   . Denies leaking of fluid.   The following portions of the patient's history were reviewed and updated as appropriate: allergies, current medications, past family history, past medical history, past social history, past surgical history and problem list.   Objective:  There were no vitals filed for this visit.  Fetal Status:           General:  Alert, oriented and cooperative. Patient is in no acute distress.  Skin: Skin is warm and dry. No rash noted.   Cardiovascular: Normal heart rate noted  Respiratory: Normal respiratory effort, no problems with respiration noted  Abdomen: Soft, gravid, appropriate for gestational age.        Pelvic: Cervical exam deferred        Extremities: Normal range of motion.     Mental Status: Normal mood and affect. Normal behavior. Normal judgment and thought content.   Assessment and Plan:  Pregnancy: G3P2002 at [redacted]w[redacted]d 1. Supervision of high risk pregnancy, antepartum (Primary) Check AFP Anatomy overall complete.   2. Rubella non-immune status, antepartum Recommend MMR after delivery  3. Obesity affecting pregnancy, antepartum, unspecified obesity type  4. History of prior pregnancy with IUGR newborn F/u on 4/28  5. History of pre-eclampsia Continue ldASA  6. Pregnancy with 19 completed weeks gestation   Preterm labor symptoms and general obstetric precautions including but not limited to vaginal bleeding, contractions, leaking of fluid and fetal movement were reviewed in  detail with the patient. Please refer to After Visit Summary for other counseling recommendations.   No follow-ups on file.  Future Appointments  Date Time Provider Department Center  05/09/2023  8:15 AM Milas Hock, MD [facility]  06/02/2023  1:00 PM [facility] PROVIDER 1 [facility]  06/02/2023  1:30 PM [facility] US6 [facility]    Milas Hock, MD

## 2023-05-09 ENCOUNTER — Other Ambulatory Visit: Payer: Self-pay

## 2023-05-09 ENCOUNTER — Ambulatory Visit: Admitting: Obstetrics and Gynecology

## 2023-05-09 ENCOUNTER — Encounter: Payer: Self-pay | Admitting: Obstetrics and Gynecology

## 2023-05-09 VITALS — BP 105/72 | HR 76 | Wt 176.0 lb

## 2023-05-09 DIAGNOSIS — O0990 Supervision of high risk pregnancy, unspecified, unspecified trimester: Secondary | ICD-10-CM | POA: Diagnosis not present

## 2023-05-09 DIAGNOSIS — Z8759 Personal history of other complications of pregnancy, childbirth and the puerperium: Secondary | ICD-10-CM

## 2023-05-09 DIAGNOSIS — O0992 Supervision of high risk pregnancy, unspecified, second trimester: Secondary | ICD-10-CM

## 2023-05-09 DIAGNOSIS — O99212 Obesity complicating pregnancy, second trimester: Secondary | ICD-10-CM | POA: Diagnosis not present

## 2023-05-09 DIAGNOSIS — Z3A19 19 weeks gestation of pregnancy: Secondary | ICD-10-CM

## 2023-05-09 DIAGNOSIS — Z2839 Other underimmunization status: Secondary | ICD-10-CM

## 2023-05-09 DIAGNOSIS — O99210 Obesity complicating pregnancy, unspecified trimester: Secondary | ICD-10-CM

## 2023-05-09 DIAGNOSIS — O09892 Supervision of other high risk pregnancies, second trimester: Secondary | ICD-10-CM

## 2023-05-09 DIAGNOSIS — O09899 Supervision of other high risk pregnancies, unspecified trimester: Secondary | ICD-10-CM

## 2023-05-11 ENCOUNTER — Encounter: Payer: Self-pay | Admitting: Obstetrics and Gynecology

## 2023-05-11 LAB — AFP, SERUM, OPEN SPINA BIFIDA
AFP MoM: 0.79
AFP Value: 38.6 ng/mL
Gest. Age on Collection Date: 19.7 wk
Maternal Age At EDD: 28.5 a
OSBR Risk 1 IN: 10000
Test Results:: NEGATIVE
Weight: 176 [lb_av]

## 2023-06-02 ENCOUNTER — Other Ambulatory Visit: Payer: Self-pay | Admitting: *Deleted

## 2023-06-02 ENCOUNTER — Ambulatory Visit (HOSPITAL_BASED_OUTPATIENT_CLINIC_OR_DEPARTMENT_OTHER)

## 2023-06-02 ENCOUNTER — Ambulatory Visit: Attending: Obstetrics | Admitting: Obstetrics

## 2023-06-02 DIAGNOSIS — O365920 Maternal care for other known or suspected poor fetal growth, second trimester, not applicable or unspecified: Secondary | ICD-10-CM | POA: Diagnosis not present

## 2023-06-02 DIAGNOSIS — O358XX0 Maternal care for other (suspected) fetal abnormality and damage, not applicable or unspecified: Secondary | ICD-10-CM

## 2023-06-02 DIAGNOSIS — Z3A23 23 weeks gestation of pregnancy: Secondary | ICD-10-CM | POA: Insufficient documentation

## 2023-06-02 DIAGNOSIS — Z362 Encounter for other antenatal screening follow-up: Secondary | ICD-10-CM | POA: Diagnosis present

## 2023-06-02 DIAGNOSIS — O99212 Obesity complicating pregnancy, second trimester: Secondary | ICD-10-CM

## 2023-06-02 DIAGNOSIS — E669 Obesity, unspecified: Secondary | ICD-10-CM

## 2023-06-02 DIAGNOSIS — O09292 Supervision of pregnancy with other poor reproductive or obstetric history, second trimester: Secondary | ICD-10-CM

## 2023-06-02 DIAGNOSIS — O359XX0 Maternal care for (suspected) fetal abnormality and damage, unspecified, not applicable or unspecified: Secondary | ICD-10-CM | POA: Diagnosis not present

## 2023-06-02 DIAGNOSIS — O368390 Maternal care for abnormalities of the fetal heart rate or rhythm, unspecified trimester, not applicable or unspecified: Secondary | ICD-10-CM

## 2023-06-02 DIAGNOSIS — O368320 Maternal care for abnormalities of the fetal heart rate or rhythm, second trimester, not applicable or unspecified: Secondary | ICD-10-CM | POA: Diagnosis not present

## 2023-06-02 DIAGNOSIS — O99210 Obesity complicating pregnancy, unspecified trimester: Secondary | ICD-10-CM

## 2023-06-02 NOTE — Progress Notes (Signed)
 MFM Consult Note  Christie Parker is currently at 23 weeks and 1 day.  She has been followed as her prior pregnancy was complicated by IUGR.    A fetal arrhythmia was noted during her last exam.  She denies any problems since her last exam.  On today's exam, the overall EFW of 1 pound 3 ounces measures at the 26 percentile for her gestational age.  There was normal amniotic fluid noted today.  A fetal arrhythmia, most likely due to PACs continues to be noted on today's exam.   She denies any excessive caffeine use.  As there were no signs of fetal hydrops noted today, the patient was reassured that her baby most likely has not been affected by the arrhythmia.    As the arrhythmia has persisted since her last exam, the patient was offered and declined a fetal echocardiogram to be performed with pediatric cardiology.    She was reassured that based on the fetal cardiac views that were visualized today, I do not suspect that her fetus has a structural heart abnormality.    The limitations of ultrasound in the detection of all anomalies was discussed.  Due to her history of IUGR, we will continue to follow her with growth ultrasounds throughout her pregnancy.    A follow-up exam was scheduled in 4 weeks.    The patient stated that all of her questions were answered today.  A total of 30 minutes was spent counseling and coordinating the care for this patient.  Greater than 50% of the time was spent in direct face-to-face contact.

## 2023-06-09 ENCOUNTER — Encounter: Admitting: Physician Assistant

## 2023-06-09 ENCOUNTER — Encounter: Payer: Self-pay | Admitting: Medical

## 2023-06-09 ENCOUNTER — Other Ambulatory Visit: Payer: Self-pay

## 2023-06-09 ENCOUNTER — Ambulatory Visit: Admitting: Medical

## 2023-06-09 VITALS — BP 121/69 | HR 107 | Wt 182.3 lb

## 2023-06-09 DIAGNOSIS — O99210 Obesity complicating pregnancy, unspecified trimester: Secondary | ICD-10-CM

## 2023-06-09 DIAGNOSIS — Z2839 Other underimmunization status: Secondary | ICD-10-CM

## 2023-06-09 DIAGNOSIS — O368390 Maternal care for abnormalities of the fetal heart rate or rhythm, unspecified trimester, not applicable or unspecified: Secondary | ICD-10-CM | POA: Insufficient documentation

## 2023-06-09 DIAGNOSIS — O26892 Other specified pregnancy related conditions, second trimester: Secondary | ICD-10-CM

## 2023-06-09 DIAGNOSIS — O09892 Supervision of other high risk pregnancies, second trimester: Secondary | ICD-10-CM | POA: Diagnosis not present

## 2023-06-09 DIAGNOSIS — O0990 Supervision of high risk pregnancy, unspecified, unspecified trimester: Secondary | ICD-10-CM

## 2023-06-09 DIAGNOSIS — Z3A24 24 weeks gestation of pregnancy: Secondary | ICD-10-CM

## 2023-06-09 DIAGNOSIS — Z8759 Personal history of other complications of pregnancy, childbirth and the puerperium: Secondary | ICD-10-CM | POA: Diagnosis not present

## 2023-06-09 DIAGNOSIS — O26899 Other specified pregnancy related conditions, unspecified trimester: Secondary | ICD-10-CM

## 2023-06-09 DIAGNOSIS — O0992 Supervision of high risk pregnancy, unspecified, second trimester: Secondary | ICD-10-CM | POA: Diagnosis not present

## 2023-06-09 DIAGNOSIS — G5600 Carpal tunnel syndrome, unspecified upper limb: Secondary | ICD-10-CM

## 2023-06-09 DIAGNOSIS — O368320 Maternal care for abnormalities of the fetal heart rate or rhythm, second trimester, not applicable or unspecified: Secondary | ICD-10-CM | POA: Diagnosis not present

## 2023-06-09 DIAGNOSIS — O99212 Obesity complicating pregnancy, second trimester: Secondary | ICD-10-CM

## 2023-06-09 NOTE — Patient Instructions (Signed)
Remember that at your next visit you will get labs drawn. You need to be fasting (nothing to eat or drink for at least 8 hours prior to arrival) for those labs. We will be testing for gestational diabetes, anemia, HIV and syphilis. You will be in the office for at least 2 hours to complete the lab tests and your visit will occur during this time. You will also be offered the TDAP vaccine for whooping cough.  TDaP Vaccine Pregnancy Get the Whooping Cough Vaccine While You Are Pregnant (CDC)  It is important for women to get the whooping cough vaccine in the third trimester of each pregnancy. Vaccines are the best way to prevent this disease. There are 2 different whooping cough vaccines. Both vaccines combine protection against whooping cough, tetanus and diphtheria, but they are for different age groups: Tdap: for everyone 11 years or older, including pregnant women  DTaP: for children 2 months through 6 years of age  You need the whooping cough vaccine during each of your pregnancies The recommended time to get the shot is during your 27th through 36th week of pregnancy, preferably during the earlier part of this time period. The Centers for Disease Control and Prevention (CDC) recommends that pregnant women receive the whooping cough vaccine for adolescents and adults (called Tdap vaccine) during the third trimester of each pregnancy. The recommended time to get the shot is during your 27th through 36th week of pregnancy, preferably during the earlier part of this time period. This replaces the original recommendation that pregnant women get the vaccine only if they had not previously received it. The American College of Obstetricians and Gynecologists and the American College of Nurse-Midwives support this recommendation.  You should get the whooping cough vaccine while pregnant to pass protection to your baby frame support disabled and/or not supported in this browser  Learn why Christie Parker decided to  get the whooping cough vaccine in her 3rd trimester of pregnancy and how her baby girl was born with some protection against the disease. Also available on YouTube. After receiving the whooping cough vaccine, your body will create protective antibodies (proteins produced by the body to fight off diseases) and pass some of them to your baby before birth. These antibodies provide your baby some short-term protection against whooping cough in early life. These antibodies can also protect your baby from some of the more serious complications that come along with whooping cough. Your protective antibodies are at their highest about 2 weeks after getting the vaccine, but it takes time to pass them to your baby. So the preferred time to get the whooping cough vaccine is early in your third trimester. The amount of whooping cough antibodies in your body decreases over time. That is why CDC recommends you get a whooping cough vaccine during each pregnancy. Doing so allows each of your babies to get the greatest number of protective antibodies from you. This means each of your babies will get the best protection possible against this disease.  Getting the whooping cough vaccine while pregnant is better than getting the vaccine after you give birth Whooping cough vaccination during pregnancy is ideal so your baby will have short-term protection as soon as he is born. This early protection is important because your baby will not start getting his whooping cough vaccines until he is 2 months old. These first few months of life are when your baby is at greatest risk for catching whooping cough. This is also when he's at   greatest risk for having severe, potentially life-threating complications from the infection. To avoid that gap in protection, it is best to get a whooping cough vaccine during pregnancy. You will then pass protection to your baby before he is born. To continue protecting your baby, he should get whooping  cough vaccines starting at 2 months old. You may never have gotten the Tdap vaccine before and did not get it during this pregnancy. If so, you should make sure to get the vaccine immediately after you give birth, before leaving the hospital or birthing center. It will take about 2 weeks before your body develops protection (antibodies) in response to the vaccine. Once you have protection from the vaccine, you are less likely to give whooping cough to your newborn while caring for him. But remember, your baby will still be at risk for catching whooping cough from others. A recent study looked to see how effective Tdap was at preventing whooping cough in babies whose mothers got the vaccine while pregnant or in the hospital after giving birth. The study found that getting Tdap between 27 through 36 weeks of pregnancy is 85% more effective at preventing whooping cough in babies younger than 2 months old. Blood tests cannot tell if you need a whooping cough vaccine There are no blood tests that can tell you if you have enough antibodies in your body to protect yourself or your baby against whooping cough. Even if you have been sick with whooping cough in the past or previously received the vaccine, you still should get the vaccine during each pregnancy. Breastfeeding may pass some protective antibodies onto your baby By breastfeeding, you may pass some antibodies you have made in response to the vaccine to your baby. When you get a whooping cough vaccine during your pregnancy, you will have antibodies in your breast milk that you can share with your baby as soon as your milk comes in. However, your baby will not get protective antibodies immediately if you wait to get the whooping cough vaccine until after delivering your baby. This is because it takes about 2 weeks for your body to create antibodies. Learn more about the health benefits of breastfeeding.  

## 2023-06-10 NOTE — Progress Notes (Signed)
   PRENATAL VISIT NOTE  Subjective:  Christie Parker is a 28 y.o. G3P2002 at [redacted]w[redacted]d being seen today for ongoing prenatal care.  She is currently monitored for the following issues for this high-risk pregnancy and has Supervision of high risk pregnancy, antepartum; History of pre-eclampsia; History of prior pregnancy with IUGR newborn; Rubella non-immune status, antepartum; Obesity affecting pregnancy, antepartum; and Fetal arrhythmia affecting pregnancy, antepartum on their problem list.  Patient reports  pain and numbness in hands/wrists .  Contractions: Not present. Vag. Bleeding: None.  Movement: Present. Denies leaking of fluid.   The following portions of the patient's history were reviewed and updated as appropriate: allergies, current medications, past family history, past medical history, past social history, past surgical history and problem list.   Objective:   Vitals:   06/09/23 1546  BP: 121/69  Pulse: (!) 107  Weight: 182 lb 4.8 oz (82.7 kg)    Fetal Status: Fetal Heart Rate (bpm): 128 Fundal Height: 25 cm Movement: Present     General:  Alert, oriented and cooperative. Patient is in no acute distress.  Skin: Skin is warm and dry. No rash noted.   Cardiovascular: Normal heart rate noted  Respiratory: Normal respiratory effort, no problems with respiration noted  Abdomen: Soft, gravid, appropriate for gestational age.  Pain/Pressure: Absent     Pelvic: Cervical exam deferred        Extremities: Normal range of motion.  Edema: Trace  Mental Status: Normal mood and affect. Normal behavior. Normal judgment and thought content.   Assessment and Plan:  Pregnancy: G3P2002 at [redacted]w[redacted]d 1. Fetal arrhythmia affecting pregnancy, antepartum (Primary) - followed by MFM - Next US  5/29  2. Supervision of high risk pregnancy, antepartum - Anticipatory guidance for fasting labs including GTT, HIV, RPR and CBC next visit  - TDAP info given to review for next visit   3. History of  pre-eclampsia - Normotensive today   4. History of prior pregnancy with IUGR newborn  5. Rubella non-immune status, antepartum - PP MMR   6. Obesity affecting pregnancy, antepartum, unspecified obesity type   7. Carpal tunnel syndrome during pregnancy - Recommended wrist braces   8. [redacted] weeks gestation of pregnancy  Preterm labor symptoms and general obstetric precautions including but not limited to vaginal bleeding, contractions, leaking of fluid and fetal movement were reviewed in detail with the patient. Please refer to After Visit Summary for other counseling recommendations.   Return in about 4 weeks (around 07/07/2023) for 28 week labs (fasting), In-Person, any provider female preferred.  Future Appointments  Date Time Provider Department Center  07/03/2023  1:00 PM [facility] PROVIDER 1 [facility]  07/03/2023  1:30 PM [facility] US4 [facility]  07/08/2023  8:15 AM Jan Mcgill, MD [facility]  07/08/2023  8:50 AM [facility]    Army Landsman, PA-C

## 2023-07-02 ENCOUNTER — Other Ambulatory Visit: Payer: Self-pay

## 2023-07-02 DIAGNOSIS — Z3A27 27 weeks gestation of pregnancy: Secondary | ICD-10-CM

## 2023-07-03 ENCOUNTER — Inpatient Hospital Stay (HOSPITAL_BASED_OUTPATIENT_CLINIC_OR_DEPARTMENT_OTHER)

## 2023-07-03 ENCOUNTER — Inpatient Hospital Stay (HOSPITAL_COMMUNITY)
Admission: AD | Admit: 2023-07-03 | Discharge: 2023-07-05 | DRG: 788 | Disposition: A | Discharge status: Home / Self Care | Attending: Obstetrics and Gynecology | Admitting: Obstetrics and Gynecology

## 2023-07-03 ENCOUNTER — Inpatient Hospital Stay (HOSPITAL_COMMUNITY): Admitting: Anesthesiology

## 2023-07-03 ENCOUNTER — Other Ambulatory Visit: Payer: Self-pay | Admitting: Obstetrics

## 2023-07-03 ENCOUNTER — Ambulatory Visit

## 2023-07-03 ENCOUNTER — Ambulatory Visit: Admitting: Obstetrics and Gynecology

## 2023-07-03 ENCOUNTER — Encounter (HOSPITAL_COMMUNITY)
Admission: AD | Disposition: A | Discharge status: Home / Self Care | Payer: Self-pay | Source: Home / Self Care | Attending: Obstetrics and Gynecology

## 2023-07-03 ENCOUNTER — Other Ambulatory Visit: Payer: Self-pay

## 2023-07-03 ENCOUNTER — Encounter (HOSPITAL_COMMUNITY): Payer: Self-pay | Admitting: Family Medicine

## 2023-07-03 DIAGNOSIS — Z3A27 27 weeks gestation of pregnancy: Secondary | ICD-10-CM

## 2023-07-03 DIAGNOSIS — O403XX0 Polyhydramnios, third trimester, not applicable or unspecified: Secondary | ICD-10-CM | POA: Diagnosis not present

## 2023-07-03 DIAGNOSIS — Z8249 Family history of ischemic heart disease and other diseases of the circulatory system: Secondary | ICD-10-CM | POA: Diagnosis not present

## 2023-07-03 DIAGNOSIS — O402XX0 Polyhydramnios, second trimester, not applicable or unspecified: Secondary | ICD-10-CM | POA: Diagnosis present

## 2023-07-03 DIAGNOSIS — Z23 Encounter for immunization: Secondary | ICD-10-CM

## 2023-07-03 DIAGNOSIS — O368390 Maternal care for abnormalities of the fetal heart rate or rhythm, unspecified trimester, not applicable or unspecified: Secondary | ICD-10-CM | POA: Diagnosis not present

## 2023-07-03 DIAGNOSIS — O09292 Supervision of pregnancy with other poor reproductive or obstetric history, second trimester: Secondary | ICD-10-CM | POA: Insufficient documentation

## 2023-07-03 DIAGNOSIS — Z98891 History of uterine scar from previous surgery: Secondary | ICD-10-CM

## 2023-07-03 DIAGNOSIS — O368320 Maternal care for abnormalities of the fetal heart rate or rhythm, second trimester, not applicable or unspecified: Secondary | ICD-10-CM | POA: Diagnosis not present

## 2023-07-03 DIAGNOSIS — Z3A00 Weeks of gestation of pregnancy not specified: Secondary | ICD-10-CM | POA: Diagnosis not present

## 2023-07-03 DIAGNOSIS — R001 Bradycardia, unspecified: Secondary | ICD-10-CM | POA: Insufficient documentation

## 2023-07-03 DIAGNOSIS — O99214 Obesity complicating childbirth: Secondary | ICD-10-CM | POA: Diagnosis present

## 2023-07-03 DIAGNOSIS — O99212 Obesity complicating pregnancy, second trimester: Secondary | ICD-10-CM | POA: Insufficient documentation

## 2023-07-03 DIAGNOSIS — O1202 Gestational edema, second trimester: Secondary | ICD-10-CM | POA: Diagnosis not present

## 2023-07-03 LAB — COMPREHENSIVE METABOLIC PANEL WITH GFR
ALT: 16 U/L (ref 0–44)
AST: 18 U/L (ref 15–41)
Albumin: 2.8 g/dL — ABNORMAL LOW (ref 3.5–5.0)
Alkaline Phosphatase: 51 U/L (ref 38–126)
Anion gap: 8 (ref 5–15)
BUN: 9 mg/dL (ref 6–20)
CO2: 21 mmol/L — ABNORMAL LOW (ref 22–32)
Calcium: 8.8 mg/dL — ABNORMAL LOW (ref 8.9–10.3)
Chloride: 107 mmol/L (ref 98–111)
Creatinine, Ser: 0.41 mg/dL — ABNORMAL LOW (ref 0.44–1.00)
GFR, Estimated: >60 mL/min (ref >60)
Glucose, Bld: 70 mg/dL (ref 70–99)
Potassium: 3.4 mmol/L — ABNORMAL LOW (ref 3.5–5.1)
Sodium: 136 mmol/L (ref 135–145)
Total Bilirubin: 0.3 mg/dL (ref 0.0–1.2)
Total Protein: 6.6 g/dL (ref 6.5–8.1)

## 2023-07-03 LAB — TYPE AND SCREEN
ABO/RH(D): B POS
Antibody Screen: NEGATIVE

## 2023-07-03 LAB — CBC
HCT: 35.3 % — ABNORMAL LOW (ref 36.0–46.0)
Hemoglobin: 11.8 g/dL — ABNORMAL LOW (ref 12.0–15.0)
MCH: 30 pg (ref 26.0–34.0)
MCHC: 33.4 g/dL (ref 30.0–36.0)
MCV: 89.8 fL (ref 80.0–100.0)
Platelets: 287 10*3/uL (ref 150–400)
RBC: 3.93 MIL/uL (ref 3.87–5.11)
RDW: 12.9 % (ref 11.5–15.5)
WBC: 10.2 10*3/uL (ref 4.0–10.5)
nRBC: 0 % (ref 0.0–0.2)

## 2023-07-03 SURGERY — Surgical Case
Anesthesia: Regional

## 2023-07-03 MED ORDER — DIPHENHYDRAMINE HCL 25 MG PO CAPS: 25.0000 mg | ORAL_CAPSULE | Freq: Four times a day (QID) | ORAL | Status: DC | PRN

## 2023-07-03 MED ORDER — ZOLPIDEM TARTRATE 5 MG PO TABS: 5.0000 mg | ORAL_TABLET | Freq: Every evening | ORAL | Status: DC | PRN

## 2023-07-03 MED ORDER — ACETAMINOPHEN 325 MG PO TABS
650.0000 mg | ORAL_TABLET | ORAL | Status: DC | PRN
Start: 2023-07-03 — End: 2023-07-03

## 2023-07-03 MED ORDER — PENICILLIN G POT IN DEXTROSE 60000 UNIT/ML IV SOLN: 3.0000 10*6.[IU] | INTRAVENOUS | Status: DC

## 2023-07-03 MED ORDER — OXYCODONE HCL 5 MG/5ML PO SOLN: 5.0000 mg | Freq: Once | ORAL | Status: DC | PRN

## 2023-07-03 MED ORDER — HYDROMORPHONE HCL 1 MG/ML IJ SOLN
INTRAMUSCULAR | Status: DC | PRN
  Administered 2023-07-03 (×2): 1 mg via INTRAVENOUS

## 2023-07-03 MED ORDER — CEFAZOLIN SODIUM-DEXTROSE 2-4 GM/100ML-% IV SOLN
INTRAVENOUS | Status: AC
  Filled 2023-07-03: qty 100

## 2023-07-03 MED ORDER — IBUPROFEN 600 MG PO TABS
600.0000 mg | ORAL_TABLET | Freq: Four times a day (QID) | ORAL | Status: DC
  Administered 2023-07-04 – 2023-07-05 (×4): 600 mg via ORAL
  Filled 2023-07-03 (×4): qty 1

## 2023-07-03 MED ORDER — SENNOSIDES-DOCUSATE SODIUM 8.6-50 MG PO TABS
2.0000 | ORAL_TABLET | Freq: Every day | ORAL | Status: DC
Start: 2023-07-04 — End: 2023-07-05
  Administered 2023-07-04 – 2023-07-05 (×2): 2 via ORAL
  Filled 2023-07-03 (×2): qty 2

## 2023-07-03 MED ORDER — SUCCINYLCHOLINE CHLORIDE 200 MG/10ML IV SOSY
PREFILLED_SYRINGE | INTRAVENOUS | Status: AC
  Filled 2023-07-03: qty 10

## 2023-07-03 MED ORDER — FENTANYL CITRATE (PF) 100 MCG/2ML IJ SOLN: 25.0000 ug | INTRAMUSCULAR | Status: DC | PRN

## 2023-07-03 MED ORDER — GABAPENTIN 100 MG PO CAPS
200.0000 mg | ORAL_CAPSULE | Freq: Every day | ORAL | Status: DC
  Administered 2023-07-03 – 2023-07-04 (×2): 200 mg via ORAL
  Filled 2023-07-03 (×2): qty 2

## 2023-07-03 MED ORDER — PHENYLEPHRINE HCL (PRESSORS) 10 MG/ML IV SOLN
INTRAVENOUS | Status: DC | PRN
  Administered 2023-07-03: 160 ug via INTRAVENOUS
  Administered 2023-07-03: 120 ug via INTRAVENOUS

## 2023-07-03 MED ORDER — DEXAMETHASONE SODIUM PHOSPHATE 10 MG/ML IJ SOLN
INTRAMUSCULAR | Status: DC | PRN
  Administered 2023-07-03: 10 mg via INTRAVENOUS

## 2023-07-03 MED ORDER — SODIUM CHLORIDE 0.9 % IV SOLN: 5.0000 10*6.[IU] | Freq: Once | INTRAVENOUS | Status: DC

## 2023-07-03 MED ORDER — ONDANSETRON HCL 4 MG/2ML IJ SOLN: 4.0000 mg | Freq: Four times a day (QID) | INTRAMUSCULAR | Status: DC | PRN

## 2023-07-03 MED ORDER — FENTANYL CITRATE (PF) 100 MCG/2ML IJ SOLN
INTRAMUSCULAR | Status: DC | PRN
  Administered 2023-07-03 (×2): 50 ug via INTRAVENOUS

## 2023-07-03 MED ORDER — ENOXAPARIN SODIUM 40 MG/0.4ML IJ SOSY
40.0000 mg | PREFILLED_SYRINGE | INTRAMUSCULAR | Status: DC
  Administered 2023-07-04 – 2023-07-05 (×2): 40 mg via SUBCUTANEOUS
  Filled 2023-07-03 (×2): qty 0.4

## 2023-07-03 MED ORDER — DROPERIDOL 2.5 MG/ML IJ SOLN: 0.6250 mg | Freq: Once | INTRAMUSCULAR | Status: DC | PRN

## 2023-07-03 MED ORDER — LACTATED RINGERS IV SOLN: 500.0000 mL | INTRAVENOUS | Status: DC | PRN

## 2023-07-03 MED ORDER — HYDROMORPHONE HCL 1 MG/ML IJ SOLN
INTRAMUSCULAR | Status: AC
  Filled 2023-07-03: qty 1

## 2023-07-03 MED ORDER — SOD CITRATE-CITRIC ACID 500-334 MG/5ML PO SOLN: 30.0000 mL | ORAL | Status: DC | PRN

## 2023-07-03 MED ORDER — ACETAMINOPHEN 10 MG/ML IV SOLN
INTRAVENOUS | Status: AC
  Filled 2023-07-03: qty 100

## 2023-07-03 MED ORDER — DEXAMETHASONE SODIUM PHOSPHATE 10 MG/ML IJ SOLN
INTRAMUSCULAR | Status: AC
  Filled 2023-07-03: qty 1

## 2023-07-03 MED ORDER — ONDANSETRON HCL 4 MG/2ML IJ SOLN
INTRAMUSCULAR | Status: AC
Start: 2023-07-03 — End: ?
  Filled 2023-07-03: qty 2

## 2023-07-03 MED ORDER — PROPOFOL 10 MG/ML IV BOLUS
INTRAVENOUS | Status: DC | PRN
  Administered 2023-07-03: 200 mg via INTRAVENOUS

## 2023-07-03 MED ORDER — ACETAMINOPHEN 500 MG PO TABS
1000.0000 mg | ORAL_TABLET | Freq: Four times a day (QID) | ORAL | Status: DC
  Administered 2023-07-03 – 2023-07-05 (×6): 1000 mg via ORAL
  Filled 2023-07-03 (×7): qty 2

## 2023-07-03 MED ORDER — TETANUS-DIPHTH-ACELL PERTUSSIS 5-2.5-18.5 LF-MCG/0.5 IM SUSY
0.5000 mL | PREFILLED_SYRINGE | Freq: Once | INTRAMUSCULAR | Status: AC
  Administered 2023-07-05: 0.5 mL via INTRAMUSCULAR
  Filled 2023-07-03: qty 0.5

## 2023-07-03 MED ORDER — HYDROMORPHONE HCL 1 MG/ML IJ SOLN
0.5000 mg | INTRAMUSCULAR | Status: DC | PRN
  Administered 2023-07-03 (×2): 0.5 mg via INTRAVENOUS

## 2023-07-03 MED ORDER — OXYTOCIN-SODIUM CHLORIDE 30-0.9 UT/500ML-% IV SOLN: 2.5000 [IU]/h | INTRAVENOUS | Status: AC

## 2023-07-03 MED ORDER — MEDROXYPROGESTERONE ACETATE 150 MG/ML IM SUSP: 150.0000 mg | INTRAMUSCULAR | Status: DC | PRN

## 2023-07-03 MED ORDER — MAGNESIUM HYDROXIDE 400 MG/5ML PO SUSP
30.0000 mL | ORAL | Status: DC | PRN
Start: 2023-07-03 — End: 2023-07-05

## 2023-07-03 MED ORDER — PRENATAL MULTIVITAMIN CH
1.0000 | ORAL_TABLET | Freq: Every day | ORAL | Status: DC
  Administered 2023-07-04 – 2023-07-05 (×2): 1 via ORAL
  Filled 2023-07-03 (×2): qty 1

## 2023-07-03 MED ORDER — OXYCODONE HCL 5 MG PO TABS: 5.0000 mg | ORAL_TABLET | ORAL | Status: DC | PRN

## 2023-07-03 MED ORDER — ACETAMINOPHEN 10 MG/ML IV SOLN
INTRAVENOUS | Status: DC | PRN
  Administered 2023-07-03: 1000 mg via INTRAVENOUS

## 2023-07-03 MED ORDER — FENTANYL CITRATE (PF) 100 MCG/2ML IJ SOLN
INTRAMUSCULAR | Status: AC
  Filled 2023-07-03: qty 2

## 2023-07-03 MED ORDER — OXYTOCIN BOLUS FROM INFUSION: 333.0000 mL | Freq: Once | INTRAVENOUS | Status: DC

## 2023-07-03 MED ORDER — FENTANYL CITRATE (PF) 250 MCG/5ML IJ SOLN
INTRAMUSCULAR | Status: AC
  Filled 2023-07-03: qty 5

## 2023-07-03 MED ORDER — OXYCODONE-ACETAMINOPHEN 5-325 MG PO TABS: 1.0000 | ORAL_TABLET | ORAL | Status: DC | PRN

## 2023-07-03 MED ORDER — ONDANSETRON HCL 4 MG/2ML IJ SOLN
INTRAMUSCULAR | Status: DC | PRN
  Administered 2023-07-03: 4 mg via INTRAVENOUS

## 2023-07-03 MED ORDER — ACETAMINOPHEN 10 MG/ML IV SOLN: 1000.0000 mg | Freq: Once | INTRAVENOUS | Status: DC | PRN

## 2023-07-03 MED ORDER — CEFAZOLIN SODIUM-DEXTROSE 2-4 GM/100ML-% IV SOLN
INTRAVENOUS | Status: AC
Start: 2023-07-03 — End: ?
  Filled 2023-07-03: qty 100

## 2023-07-03 MED ORDER — SODIUM CHLORIDE 0.9% FLUSH: 3.0000 mL | INTRAVENOUS | Status: DC | PRN

## 2023-07-03 MED ORDER — SIMETHICONE 80 MG PO CHEW: 80.0000 mg | CHEWABLE_TABLET | ORAL | Status: DC | PRN

## 2023-07-03 MED ORDER — KETOROLAC TROMETHAMINE 30 MG/ML IJ SOLN
30.0000 mg | Freq: Four times a day (QID) | INTRAMUSCULAR | Status: AC
Start: 2023-07-03 — End: 2023-07-04
  Administered 2023-07-03 – 2023-07-04 (×4): 30 mg via INTRAVENOUS
  Filled 2023-07-03 (×4): qty 1

## 2023-07-03 MED ORDER — LIDOCAINE HCL (PF) 1 % IJ SOLN: 30.0000 mL | INTRAMUSCULAR | Status: DC | PRN

## 2023-07-03 MED ORDER — OXYTOCIN-SODIUM CHLORIDE 30-0.9 UT/500ML-% IV SOLN
INTRAVENOUS | Status: DC | PRN
  Administered 2023-07-03: 300 mL via INTRAVENOUS

## 2023-07-03 MED ORDER — LACTATED RINGERS IV SOLN: INTRAVENOUS | Status: DC | PRN

## 2023-07-03 MED ORDER — CEFAZOLIN SODIUM-DEXTROSE 2-3 GM-%(50ML) IV SOLR
INTRAVENOUS | Status: DC | PRN
  Administered 2023-07-03: 2 g via INTRAVENOUS

## 2023-07-03 MED ORDER — FENTANYL CITRATE (PF) 250 MCG/5ML IJ SOLN
INTRAMUSCULAR | Status: DC | PRN
  Administered 2023-07-03: 150 ug via INTRAVENOUS
  Administered 2023-07-03: 100 ug via INTRAVENOUS

## 2023-07-03 MED ORDER — OXYTOCIN-SODIUM CHLORIDE 30-0.9 UT/500ML-% IV SOLN: 2.5000 [IU]/h | INTRAVENOUS | Status: DC

## 2023-07-03 MED ORDER — FENTANYL CITRATE (PF) 100 MCG/2ML IJ SOLN: 50.0000 ug | INTRAMUSCULAR | Status: DC | PRN

## 2023-07-03 MED ORDER — SIMETHICONE 80 MG PO CHEW
80.0000 mg | CHEWABLE_TABLET | Freq: Three times a day (TID) | ORAL | Status: DC
Start: 2023-07-04 — End: 2023-07-05
  Administered 2023-07-04 – 2023-07-05 (×4): 80 mg via ORAL
  Filled 2023-07-03 (×4): qty 1

## 2023-07-03 MED ORDER — OXYCODONE HCL 5 MG PO TABS: 5.0000 mg | ORAL_TABLET | Freq: Once | ORAL | Status: DC | PRN

## 2023-07-03 MED ORDER — MEASLES, MUMPS & RUBELLA VAC IJ SOLR
0.5000 mL | Freq: Once | INTRAMUSCULAR | Status: AC
  Administered 2023-07-05: 0.5 mL via SUBCUTANEOUS
  Filled 2023-07-03: qty 0.5

## 2023-07-03 MED ORDER — WITCH HAZEL-GLYCERIN EX PADS: 1.0000 | MEDICATED_PAD | CUTANEOUS | Status: DC | PRN

## 2023-07-03 MED ORDER — MENTHOL 3 MG MT LOZG: 1.0000 | LOZENGE | OROMUCOSAL | Status: DC | PRN

## 2023-07-03 MED ORDER — PROPOFOL 10 MG/ML IV BOLUS
INTRAVENOUS | Status: AC
  Filled 2023-07-03: qty 20

## 2023-07-03 MED ORDER — LACTATED RINGERS IV SOLN: INTRAVENOUS | Status: DC

## 2023-07-03 MED ORDER — SUCCINYLCHOLINE CHLORIDE 200 MG/10ML IV SOSY
PREFILLED_SYRINGE | INTRAVENOUS | Status: DC | PRN
  Administered 2023-07-03: 140 mg via INTRAVENOUS

## 2023-07-03 MED ORDER — SODIUM CHLORIDE 0.9% FLUSH
3.0000 mL | Freq: Two times a day (BID) | INTRAVENOUS | Status: DC
  Administered 2023-07-04 (×2): 10 mL via INTRAVENOUS

## 2023-07-03 MED ORDER — OXYCODONE-ACETAMINOPHEN 5-325 MG PO TABS: 2.0000 | ORAL_TABLET | ORAL | Status: DC | PRN

## 2023-07-03 MED ORDER — OXYTOCIN-SODIUM CHLORIDE 30-0.9 UT/500ML-% IV SOLN
INTRAVENOUS | Status: AC
  Filled 2023-07-03: qty 500

## 2023-07-03 MED ORDER — COCONUT OIL OIL: 1.0000 | TOPICAL_OIL | Status: DC | PRN

## 2023-07-03 MED ORDER — DIBUCAINE (PERIANAL) 1 % EX OINT: 1.0000 | TOPICAL_OINTMENT | CUTANEOUS | Status: DC | PRN

## 2023-07-03 SURGICAL SUPPLY — 25 items
BENZOIN TINCTURE PRP APPL 2/3 (GAUZE/BANDAGES/DRESSINGS) IMPLANT
CHLORAPREP W/TINT 26 (MISCELLANEOUS) ×2 IMPLANT
CLAMP UMBILICAL CORD (MISCELLANEOUS) ×1 IMPLANT
CLOTH BEACON ORANGE TIMEOUT ST (SAFETY) ×1 IMPLANT
DERMABOND ADVANCED .7 DNX12 (GAUZE/BANDAGES/DRESSINGS) ×1 IMPLANT
DRSG OPSITE POSTOP 4X10 (GAUZE/BANDAGES/DRESSINGS) ×1 IMPLANT
ELECTRODE REM PT RTRN 9FT ADLT (ELECTROSURGICAL) ×1 IMPLANT
EXTRACTOR VACUUM KIWI (MISCELLANEOUS) ×1 IMPLANT
GLOVE BIOGEL PI IND STRL 7.0 (GLOVE) ×3 IMPLANT
GLOVE ECLIPSE 6.5 STRL STRAW (GLOVE) ×1 IMPLANT
GOWN STRL REUS W/ TWL LRG LVL3 (GOWN DISPOSABLE) ×2 IMPLANT
NS IRRIG 1000ML POUR BTL (IV SOLUTION) ×1 IMPLANT
PAD OB MATERNITY 4.3X12.25 (PERSONAL CARE ITEMS) ×1 IMPLANT
PAD PREP 24X48 CUFFED NSTRL (MISCELLANEOUS) ×1 IMPLANT
RETRACTOR WND ALEXIS 25 LRG (MISCELLANEOUS) IMPLANT
STRIP CLOSURE SKIN 1/2X4 (GAUZE/BANDAGES/DRESSINGS) IMPLANT
SUT MNCRL AB 0 CT1 27 (SUTURE) ×1 IMPLANT
SUT PLAIN 2 0 XLH (SUTURE) ×1 IMPLANT
SUT VIC AB 0 CT1 36 (SUTURE) ×1 IMPLANT
SUT VIC AB 0 CTX36XBRD ANBCTRL (SUTURE) ×1 IMPLANT
SUT VIC AB 2-0 CT1 TAPERPNT 27 (SUTURE) ×1 IMPLANT
SUT VIC AB 4-0 KS 27 (SUTURE) ×1 IMPLANT
TOWEL OR 17X24 6PK STRL BLUE (TOWEL DISPOSABLE) ×3 IMPLANT
TRAY FOLEY W/BAG SLVR 16FR ST (SET/KITS/TRAYS/PACK) ×1 IMPLANT
WATER STERILE IRR 1000ML POUR (IV SOLUTION) ×1 IMPLANT

## 2023-07-03 NOTE — Anesthesia Procedure Notes (Signed)
 Procedure Name: Intubation Date/Time: 07/03/2023 3:18 PM  Performed by: Lethaniel Rave, MDPre-anesthesia Checklist: Patient identified, Emergency Drugs available, Suction available, Patient being monitored and Timeout performed Patient Re-evaluated:Patient Re-evaluated prior to induction Oxygen Delivery Method: Circle system utilized Preoxygenation: Pre-oxygenation with 100% oxygen Induction Type: IV induction, Rapid sequence and Cricoid Pressure applied Laryngoscope Size: Glidescope and 3 Grade View: Grade I Tube type: Oral Tube size: 7.0 mm Number of attempts: 1 Airway Equipment and Method: Stylet and Video-laryngoscopy Placement Confirmation: ETT inserted through vocal cords under direct vision, breath sounds checked- equal and bilateral, CO2 detector and positive ETCO2 Secured at: 22 cm Tube secured with: Tape Dental Injury: Teeth and Oropharynx as per pre-operative assessment

## 2023-07-03 NOTE — H&P (Addendum)
 OBSTETRIC ADMISSION HISTORY AND PHYSICAL  Christie Parker is a 28 y.o. female G37P2002 with IUP at [redacted]w[redacted]d by 10 week US  presenting for Fetal bradycardia noted on MFM US  today with concern for congenital heart block vs fetal distress. She reports +FMs, No LOF, no VB, no blurry vision, headaches or peripheral edema, and RUQ pain.  She plans on breast feeding. She request out patient IUD for birth control. She received her prenatal care at [facility]   Dating: By 10 week US  --->  Estimated Date of Delivery: 09/28/23  Sono:   @[redacted]w[redacted]d , CWD, normal female anatomy, cephalic presentation, anterior placental lie, 1108 gm 2 lb 7 oz 41 %  EFW  Prenatal History/Complications:  - Fetal bradycardia, concerning for congenital heart block; no known risk factors - Polyhydramnios - Elevated dopplers today - Hx of preeclampsia - Hx of previous FGR infant - Rubella non-immune - Obesity  Past Medical History: Past Medical History:  Diagnosis Date   Gestational hypertension     Past Surgical History: Past Surgical History:  Procedure Laterality Date   NO PAST SURGERIES      Obstetrical History: OB History     Gravida  3   Para  2   Term  2   Preterm      AB      Living  2      SAB      IAB      Ectopic      Multiple  0   Live Births  2           Social History Social History   Socioeconomic History   Marital status: Married    Spouse name: Not on file   Number of children: Not on file   Years of education: Not on file   Highest education level: Not on file  Occupational History   Not on file  Tobacco Use   Smoking status: Never   Smokeless tobacco: Never  Vaping Use   Vaping status: Never Used  Substance and Sexual Activity   Alcohol use: Never   Drug use: Never   Sexual activity: Yes    Birth control/protection: None  Other Topics Concern   Not on file  Social History Narrative   Not on file   Social Drivers of Health   Financial Resource Strain: Not on file  Food  Insecurity: Unknown (07/03/2023)   Hunger Vital Sign    Worried About Running Out of Food in the Last Year: Not on file    Ran Out of Food in the Last Year: Never true  Transportation Needs: No Transportation Needs (07/03/2023)   PRAPARE - Administrator, Civil Service (Medical): No    Lack of Transportation (Non-Medical): No  Physical Activity: Not on file  Stress: Not on file  Social Connections: Unknown (06/19/2021)   [facility]   Social Network    Social Network: Not on file    Family History: Family History  Problem Relation Age of Onset   Hypertension Mother     Allergies: No Known Allergies  Medications Prior to Admission  Medication Sig Dispense Refill Last Dose/Taking   aspirin  EC 81 MG tablet Take 1 tablet (81 mg total) by mouth daily. Start taking when you are [redacted] weeks pregnant for rest of pregnancy for prevention of preeclampsia 300 tablet 2    Blood Pressure Monitoring (BLOOD PRESSURE KIT) DEVI 1 each by Does not apply route as needed. 1  each 0    famotidine  (PEPCID ) 20 MG tablet Take 1 tablet (20 mg total) by mouth 2 (two) times daily. (Patient not taking: Reported on 06/09/2023) 180 tablet 0    Prenatal Vit-Fe Fumarate-FA (PRENATAL VITAMIN) 27-0.8 MG TABS Take 1 tablet by mouth daily. 30 tablet 6      Review of Systems   All systems reviewed and negative except as stated in HPI  Height 5\' 2"  (1.575 m), weight 84 kg, last menstrual period 12/22/2022, currently breastfeeding. General appearance: alert and cooperative Lungs: clear to auscultation bilaterally Heart: regular rate and rhythm Abdomen: soft, non-tender; bowel sounds normal Pelvic: adequate Extremities: Homans sign is negative, no sign of DVT DTR's WNL Presentation: cephalic Fetal monitoringBaseline: 80-90 bpm, Variability: Good {> 6 bpm), Accelerations: Non-reactive but appropriate for gestational age, and Decelerations: Absent Uterine activityNone    Prenatal labs: ABO, Rh: --/--/PENDING (05/29 1452) Antibody: PENDING (05/29 1452) Rubella: 0.91 (02/04 1154) RPR: Non Reactive (02/04 1154)  HBsAg: Negative (02/04 1154)  HIV: Non Reactive (02/04 1154)  GBS:      Lab Results  Component Value Date   GBS Negative 10/20/2020   GTT not yet completed Genetic screening  low risk NIPS, negative [facility] US  WNL  Immunization History  Administered Date(s) Administered   Influenza, Seasonal, Injecte, Preservative Fre 03/11/2023   Influenza,inj,Quad PF,6+ Mos 05/18/2020, 12/14/2020   Tdap 05/24/2019, 08/17/2020    Prenatal Transfer Tool  Maternal Diabetes: No-- Has not been tested Genetic Screening: Normal Maternal Ultrasounds/Referrals: Other: Polyhydramnios noted 07/03/23  Fetal Ultrasounds or other Referrals:  None Maternal Substance Abuse:  No Significant Maternal Medications:  None Significant Maternal Lab Results: None- unknown Number of Prenatal Visits:greater than 3 verified prenatal visits Maternal Vaccinations: NA Other Comments:  Fetal bradycardia- noted to have fetal arrhythmia noted on anatomy scan-- thought to be PACs in March and April. Presented to MFM today for routine follow up and found to have persistent fetal bradycardia in the 70-90s with similar atrial and ventricular rates and no r/f for heart block.     Results for orders placed or performed during the hospital encounter of 07/03/23 (from the past 24 hours)  Type and screen [facility]   Collection Time: 07/03/23  2:52 PM  Result Value Ref Range   ABO/RH(D) PENDING    Antibody Screen PENDING    Sample Expiration      07/06/2023,2359 Performed at [facility], [address] [place], [place] [address]   CBC   Collection Time: 07/03/23  2:54 PM  Result Value Ref Range   WBC 10.2 4.0 - 10.5 K/uL   RBC 3.93 3.87 - 5.11 MIL/uL   Hemoglobin 11.8 (L) 12.0 - 15.0 g/dL   HCT 60.4 (L) 54.0 - 98.1 %   MCV 89.8 80.0 - 100.0 fL    MCH 30.0 26.0 - 34.0 pg   MCHC 33.4 30.0 - 36.0 g/dL   RDW 19.1 47.8 - 29.5 %   Platelets 287 150 - 400 K/uL   nRBC 0.0 0.0 - 0.2 %  Comprehensive metabolic panel   Collection Time: 07/03/23  2:54 PM  Result Value Ref Range   Sodium 136 135 - 145 mmol/L   Potassium 3.4 (L) 3.5 - 5.1 mmol/L   Chloride 107 98 - 111 mmol/L   CO2 21 (L) 22 - 32 mmol/L   Glucose, Bld 70 70 - 99 mg/dL   BUN 9 6 - 20 mg/dL   Creatinine, Ser 6.21 (L) 0.44 -  1.00 mg/dL   Calcium 8.8 (L) 8.9 - 10.3 mg/dL   Total Protein 6.6 6.5 - 8.1 g/dL   Albumin 2.8 (L) 3.5 - 5.0 g/dL   AST 18 15 - 41 U/L   ALT 16 0 - 44 U/L   Alkaline Phosphatase 51 38 - 126 U/L   Total Bilirubin 0.3 0.0 - 1.2 mg/dL   GFR, Estimated >86 >57 mL/min   Anion gap 8 5 - 15    Patient Active Problem List   Diagnosis Date Noted   S/P primary low transverse C-section 07/03/2023   Fetal arrhythmia affecting pregnancy, antepartum 06/09/2023   Obesity affecting pregnancy, antepartum 04/24/2023   Rubella non-immune status, antepartum 03/16/2023   History of pre-eclampsia 03/11/2023   History of prior pregnancy with IUGR newborn 03/11/2023   Supervision of high risk pregnancy, antepartum 03/04/2023    Assessment/Plan:  Christie Parker is a 28 y.o. G3P2002 at [redacted]w[redacted]d here for fetal bradycardia  #Labor: Guarded, admitted to LD from MFM due to fetal bradycardia needing NST to help determine next steps.  #Pain: Came from outpatient clinic #FWB: 80s/moderate/no accels, no decels #GBS status:  unknown #Feeding: Breastmilk  #Reproductive Life planning: IUD #Circ:  yes  Abner Ables, MD  07/03/2023, 4:16 PM

## 2023-07-03 NOTE — Progress Notes (Addendum)
 Maternal-Fetal Medicine  Patient went over to the labor and Delivery.  On NST, bradycardia persisted. A bedside ultrasound study was performed.  Fetal heart rate was 78-95 bpm.  No atrioventricular dissociation is seen.  Unlikely to be congenital heart block. I discussed with Dr. Daisey Dryer.  I recommend management based on sinus bradycardia.

## 2023-07-03 NOTE — Progress Notes (Signed)
 Maternal-Fetal Medicine Consultation Name: Christie Parker MRN: [id number]  G3 P2002 at 27w 4d gestation.  Patient is here for fetal growth assessment.  Fetal arrhythmia was detected at previous scans.  Patient is yet to screen for gestational diabetes.  Ultrasound Fetal growth is appropriate for gestational age.  Severe polyhydramnios is seen.  Cephalic presentation.  Significant fetal bradycardia ranging from 70-99 bpm were seen.  M-mode showed no atrioventricular dissociation. Umbilical artery Doppler showed increased S/D ratio.  I counseled the couple on the finding of severe bradycardia that requires immediate transfer to labor and delivery for further management.  Differential diagnoses include congenital heart block.  Lack of atrioventricular dissociation makes it less likely. Patient does not give history of rheumatologic conditions including Sjogren syndrome.  I counseled the couple that if the diagnosis is not certain and bradycardia persist, patient will be delivered by cesarean section.  Delivery will result in extremely premature infant that will require NICU support.  I discussed with Dr. Daisey Dryer. Patient will be going over to L&D.   Consultation including face-to-face (more than 50%) counseling 10 minutes.

## 2023-07-03 NOTE — Anesthesia Postprocedure Evaluation (Signed)
 Anesthesia Post Note  Patient: Christie Parker  Procedure(s) Performed: CESAREAN DELIVERY     Patient location during evaluation: PACU Anesthesia Type: General Level of consciousness: awake and alert Pain management: pain level controlled Vital Signs Assessment: post-procedure vital signs reviewed and stable Respiratory status: spontaneous breathing, nonlabored ventilation, respiratory function stable and patient connected to nasal cannula oxygen Cardiovascular status: blood pressure returned to baseline and stable Postop Assessment: no apparent nausea or vomiting Anesthetic complications: no   No notable events documented.  Last Vitals:  Vitals:   07/03/23 1715 07/03/23 1718  BP: 90/72   Pulse: 88 (!) 102  Resp: 15 18  SpO2: 95% 95%    Last Pain:  Vitals:   07/03/23 1711  PainSc: 2    Pain Goal:    LLE Motor Response: Purposeful movement (07/03/23 1705) LLE Sensation: No pain, No tingling, No numbness (07/03/23 1705) RLE Motor Response: Purposeful movement (07/03/23 1705) RLE Sensation: No pain, No numbness, No tingling (07/03/23 1705)     Epidural/Spinal Function Cutaneous sensation: Normal sensation (07/03/23 1705), Patient able to flex knees: Yes (07/03/23 1705), Patient able to lift hips off bed: Yes (07/03/23 1705), Back pain beyond tenderness at insertion site: No (07/03/23 1705), Progressively worsening motor and/or sensory loss: No (07/03/23 1705), Bowel and/or bladder incontinence post epidural: No (07/03/23 1705)  Lethaniel Rave

## 2023-07-03 NOTE — Op Note (Signed)
 Christie Parker PROCEDURE DATE: 07/03/2023  PREOPERATIVE DIAGNOSES: Intrauterine pregnancy at [redacted]w[redacted]d weeks gestation; persistent fetal bradycardia   POSTOPERATIVE DIAGNOSES: The same  PROCEDURE: Primary Low Transverse Cesarean Section  SURGEON:  Dr. Charleston Conrad  ASSISTANT:  Dr. Darrow End An experienced assistant was required given the standard of surgical care given the complexity of the case.  This assistant was needed for exposure, dissection, suctioning, retraction, instrument exchange, and for overall help during the procedure.   ANESTHESIOLOGY TEAM: No anesthesia staff entered.  INDICATIONS: Christie Parker is a 28 y.o. G3P2002 at [redacted]w[redacted]d here for cesarean section secondary to the indications listed under preoperative diagnoses; please see preoperative note for further details.  The risks of surgery were discussed with the patient including but were not limited to: bleeding which may require transfusion or reoperation; infection which may require antibiotics; injury to bowel, bladder, ureters or other surrounding organs; injury to the fetus; need for additional procedures including hysterectomy in the event of a life-threatening hemorrhage; formation of adhesions; placental abnormalities wth subsequent pregnancies; incisional problems; thromboembolic phenomenon and other postoperative/anesthesia complications.  The patient concurred with the proposed plan, giving informed written consent for the procedure.    FINDINGS:  Viable female infant in cephalic presentation.  Apgars pending.  Clear amniotic fluid.  Intact placenta, three vessel cord.  Normal uterus, fallopian tubes and ovaries bilaterally.  ANESTHESIA: General  INTRAVENOUS FLUIDS: 2000 ml   ESTIMATED BLOOD LOSS: 324 ml URINE OUTPUT:  100 ml SPECIMENS: Placenta sent to pathology COMPLICATIONS: None immediate  PROCEDURE IN DETAIL:  The patient preoperatively received intravenous antibiotics and had sequential compression devices  applied to her lower extremities.  She was then taken to the operating room then placed in a dorsal supine position with a leftward tilt, and prepped and draped in a sterile manner.  A foley catheter was placed into her bladder and attached to constant gravity.  General anesthesia was inducted  A Pfannenstiel skin incision was made with scalpel carried through to the underlying layer of fascia. The fascia was incised in the midline, and this incision was extended bilaterally.  The rectus muscles were separated in the midline and the peritoneum was entered bluntly. The Alexis self-retaining retractor was introduced into the abdominal cavity.  Attention was turned to the lower uterine segment where a low transverse hysterotomy was made with a scalpel and extended bilaterally bluntly.  The infant was successfully delivered, the cord was clamped and cut after neonatology guided delay, and the infant was handed over to the awaiting neonatology team. Cord gases were collected. Cord blood was collected. Uterine massage was then administered, and the placenta delivered intact with a three-vessel cord. The uterus was then cleared of clots and debris.    The hysterotomy was closed with 0 Vicryl in a running locked fashion in two layers. Figure-of-eight 0 Vicryl serosal stitches were placed to help with hemostasis.The pelvis was cleared of all clot and debris. The uterus was returned to the abdomen and inspected and hemostatic.  The retractor was removed.  The peritoneum was closed with a 0 Vicryl running stitch. The fascia was then closed using 0 Vicryl in a running fashion.  The subcutaneous layer was irrigated and everything was hemostatic  and due to depth of tissue, it was re-approximated with 2-0 plain gut in a running fashion. The skin was closed with a 4-0 Vicryl  subcuticular stitch. The patient tolerated the procedure well. Sponge, instrument and needle counts were correct x 3.  She was taken  to the recovery room  in stable condition.    Darrow End, MD FMOB Fellow, Faculty practice [facility], Center for [facility]

## 2023-07-03 NOTE — Anesthesia Preprocedure Evaluation (Signed)
 Anesthesia Evaluation  Patient identified by MRN, date of birth, ID band  Reviewed: Unable to perform ROS - Chart review onlyPreop documentation limited or incomplete due to emergent nature of procedure.  Airway Mallampati: II  TM Distance: >3 FB Neck ROM: Full    Dental no notable dental hx.    Pulmonary neg pulmonary ROS   Pulmonary exam normal breath sounds clear to auscultation       Cardiovascular hypertension, Normal cardiovascular exam Rhythm:Regular Rate:Normal     Neuro/Psych negative neurological ROS     GI/Hepatic   Endo/Other    Renal/GU      Musculoskeletal   Abdominal   Peds  Hematology   Anesthesia Other Findings Plt 287  Reproductive/Obstetrics (+) Pregnancy                             Anesthesia Physical Anesthesia Plan  ASA: 2 and emergent  Anesthesia Plan: General   Post-op Pain Management:    Induction: Intravenous and Rapid sequence  PONV Risk Score and Plan: 3 and Ondansetron , Dexamethasone and Treatment may vary due to age or medical condition  Airway Management Planned: Oral ETT and Video Laryngoscope Planned  Additional Equipment: None  Intra-op Plan:   Post-operative Plan: Extubation in OR  Informed Consent: I have reviewed the patients History and Physical, chart, labs and discussed the procedure including the risks, benefits and alternatives for the proposed anesthesia with the patient or authorized representative who has indicated his/her understanding and acceptance.     Dental advisory given  Plan Discussed with: CRNA  Anesthesia Plan Comments: (Stat c-section called from L&D floor for fetal bradycardia. Emergent ROS /chart review)       Anesthesia Quick Evaluation

## 2023-07-03 NOTE — Transfer of Care (Signed)
 Immediate Anesthesia Transfer of Care Note  Patient: Christie Parker  Procedure(s) Performed: CESAREAN DELIVERY  Patient Location: PACU  Anesthesia Type:General  Level of Consciousness: awake, alert , and oriented  Airway & Oxygen Therapy: Patient Spontanous Breathing  Post-op Assessment: Report given to RN and Post -op Vital signs reviewed and stable  Post vital signs: Reviewed and stable  Last Vitals:  Vitals Value Taken Time  BP 102/63 07/03/23 1622  Temp    Pulse 98 07/03/23 1624  Resp 21 07/03/23 1624  SpO2 94 % 07/03/23 1624  Vitals shown include unfiled device data.  Last Pain: There were no vitals filed for this visit.       Complications: No notable events documented.

## 2023-07-03 NOTE — Progress Notes (Signed)
 Called by Dr Cassandria Clever urgently about fetal bradycardia.   Noted to have FHR in the 70-90s at MFM without atrial-ventricular dissociation Concern for congential heart block though there are no risk factors.  Given fetal bradycardia, will come to LD. If persisent with non reassuring monitoring we may need to proceed with urgent CS Alerted OB RN charge, NICU and my LD team

## 2023-07-03 NOTE — Progress Notes (Signed)
 Patient currently located on LD  Admitted for fetal bradycardia, repeat US  showed atrial and ventricular rates are the same.  Discussed with MFM that fetal distress/poor perfusion cannot be ruled out in this setting.   The risks of cesarean section were discussed with the patient including but were not limited to: bleeding which may require transfusion or reoperation; infection which may require antibiotics; injury to bowel, bladder, ureters or other surrounding organs; injury to the fetus; need for additional procedures including hysterectomy in the event of a life-threatening hemorrhage; placental abnormalities wth subsequent pregnancies, incisional problems, thromboembolic phenomenon and other postoperative/anesthesia complications.  The patient concurred with the proposed plan, giving informed written consent for the procedures.  Patient has been NPO since 0900 she will remain NPO for procedure. Anesthesia and OR aware.  Preoperative prophylactic antibiotics and SCDs ordered on call to the OR.  To OR when ready.   Abner Ables, MD, MPH, ABFM, [facility] Attending Physician Center for [facility]

## 2023-07-03 NOTE — Discharge Summary (Signed)
 Postpartum Discharge Summary  Date of Service updated 07/05/2023     Patient Name: Christie Parker DOB: [birth year] MRN: [id number]  Date of admission: 07/03/2023 Delivery date:07/03/2023 Delivering provider: Abner Ables Date of discharge: 07/05/2023  Admitting diagnosis: Fetal arrhythmia affecting pregnancy, antepartum [O36.8390] Intrauterine pregnancy: [redacted]w[redacted]d     Secondary diagnosis:  Active Problems:   Fetal arrhythmia affecting pregnancy, antepartum   S/P primary low transverse C-section  Additional problems: none    Discharge diagnosis: Preterm Pregnancy Delivered and Fetal bradycardia                                              Post partum procedures:no Augmentation: N/A Complications: None  Hospital course: Emergency primary C/S   28 y.o. yo G3P2103 at [redacted]w[redacted]d was admitted to the hospital 07/03/2023 for stat cesarean section with the following indication:Non-Reassuring FHR.Delivery details are as follows:  Membrane Rupture Time/Date: 3:20 PM,07/03/2023  Delivery Method:C-Section, Low Transverse Operative Delivery:N/A Details of operation can be found in separate operative note.  Patient had a postpartum course that was uncomplicated.  She is ambulating, tolerating a regular diet, passing flatus, and urinating well. Patient is discharged home in stable condition on  07/05/2023        Newborn Data: Birth date:07/03/2023 Birth time:3:20 PM Gender:Female Living status:Living Apgars:5 ,7  Weight:1150 g    Magnesium  Sulfate received: No BMZ received: No Rhophylac:No MMR:No T-DaP:Given prenatally Flu: No RSV Vaccine received: No Transfusion:No  Immunizations received: Immunization History  Administered Date(s) Administered   Influenza, Seasonal, Injecte, Preservative Fre 03/11/2023   Influenza,inj,Quad PF,6+ Mos 05/18/2020, 12/14/2020   Tdap 05/24/2019, 08/17/2020    Physical exam  Vitals:   07/04/23 1532 07/04/23 1942 07/04/23 2359 07/05/23 0932  BP: 116/65  112/65 (!) 105/54 (!) 119/17  Pulse: 89 85 85 92  Resp: 18 20 18 18   Temp: 97.6 F (36.4 C) 98 F (36.7 C) 97.9 F (36.6 C) 98.8 F (37.1 C)  TempSrc: Oral Oral Oral Oral  SpO2: 100% 100% 99% 100%  Weight:      Height:       General: alert, cooperative, and no distress Lochia: appropriate Uterine Fundus: firm Incision: Healing well with no significant drainage DVT Evaluation: No evidence of DVT seen on physical exam. Labs: Lab Results  Component Value Date   WBC 13.3 (H) 07/04/2023   HGB 9.6 (L) 07/04/2023   HCT 28.5 (L) 07/04/2023   MCV 90.5 07/04/2023   PLT 267 07/04/2023      Latest Ref Rng & Units 07/03/2023    2:54 PM  CMP  Glucose 70 - 99 mg/dL 70   BUN 6 - 20 mg/dL 9   Creatinine 7.82 - 9.56 mg/dL 2.13   Sodium 086 - 578 mmol/L 136   Potassium 3.5 - 5.1 mmol/L 3.4   Chloride 98 - 111 mmol/L 107   CO2 22 - 32 mmol/L 21   Calcium 8.9 - 10.3 mg/dL 8.8   Total Protein 6.5 - 8.1 g/dL 6.6   Total Bilirubin 0.0 - 1.2 mg/dL 0.3   Alkaline Phos 38 - 126 U/L 51   AST 15 - 41 U/L 18   ALT 0 - 44 U/L 16    Edinburgh Score:    07/04/2023    2:21 PM  Edinburgh Postnatal Depression Scale Screening Tool  I have been able to laugh and see  the funny side of things. 0  I have looked forward with enjoyment to things. 0  I have blamed myself unnecessarily when things went wrong. 0  I have been anxious or worried for no good reason. 0  I have felt scared or panicky for no good reason. 0  Things have been getting on top of me. 0  I have been so unhappy that I have had difficulty sleeping. 0  I have felt sad or miserable. 0  I have been so unhappy that I have been crying. 0  The thought of harming myself has occurred to me. 0  Edinburgh Postnatal Depression Scale Total 0   Edinburgh Postnatal Depression Scale Total: 0   After visit meds:  Allergies as of 07/05/2023   No Known Allergies      Medication List     STOP taking these medications    aspirin  EC 81 MG  tablet       TAKE these medications    Blood Pressure Kit Devi 1 each by Does not apply route as needed.   famotidine  20 MG tablet Commonly known as: Pepcid  Take 1 tablet (20 mg total) by mouth 2 (two) times daily.   ibuprofen  600 MG tablet Commonly known as: ADVIL  Take 1 tablet (600 mg total) by mouth every 6 (six) hours.   oxyCODONE  5 MG immediate release tablet Commonly known as: Oxy IR/ROXICODONE  Take 1-2 tablets (5-10 mg total) by mouth every 4 (four) hours as needed for moderate pain (pain score 4-6).   Prenatal Vitamin 27-0.8 MG Tabs Take 1 tablet by mouth daily.         Discharge home in stable condition Infant Feeding: Breast Infant Disposition:NICU Discharge instruction: per After Visit Summary and Postpartum booklet. Activity: Advance as tolerated. Pelvic rest for 6 weeks.  Diet: routine diet Future Appointments: Future Appointments  Date Time Provider Department Center  07/08/2023  8:15 AM Jan Mcgill, MD [facility]  07/08/2023  8:50 AM [facility]   Follow up Visit:  Follow-up Information     [facility] Follow up in 1 week(s).   Specialty: Obstetrics and Gynecology Why: For wound re-check Contact information: [address] [place]  [id number] [phone]               Message sent to [facility] 5/29  Please schedule this patient for a In person postpartum visit in 4 weeks with the following provider: Any provider. Additional Postpartum F/U:Incision check 1 week  Low risk pregnancy complicated by: Fetal arrhythmia Delivery mode:  C-Section, Low Transverse Anticipated Birth Control:  IUD out patient   07/05/2023 Onnie Bilis, MD

## 2023-07-04 ENCOUNTER — Other Ambulatory Visit: Payer: Self-pay

## 2023-07-04 LAB — CBC
HCT: 28.5 % — ABNORMAL LOW (ref 36.0–46.0)
Hemoglobin: 9.6 g/dL — ABNORMAL LOW (ref 12.0–15.0)
MCH: 30.5 pg (ref 26.0–34.0)
MCHC: 33.7 g/dL (ref 30.0–36.0)
MCV: 90.5 fL (ref 80.0–100.0)
Platelets: 267 10*3/uL (ref 150–400)
RBC: 3.15 MIL/uL — ABNORMAL LOW (ref 3.87–5.11)
RDW: 12.8 % (ref 11.5–15.5)
WBC: 13.3 10*3/uL — ABNORMAL HIGH (ref 4.0–10.5)
nRBC: 0 % (ref 0.0–0.2)

## 2023-07-04 LAB — RPR: RPR Ser Ql: NONREACTIVE

## 2023-07-04 NOTE — Lactation Note (Signed)
 This note was copied from a baby's chart.  NICU Lactation Consultation Note  Patient Name: Boy Idalie Canto [id number] Date: 07/04/2023 Age:28 hours  Reason for consult: Initial assessment; NICU baby; Preterm <34wks; Infant < 5lbs; Other (Comment) (IUGR, severe polyhydramnios, concern for fetal heart block)  SUBJECTIVE  LC in to visit with P3 Mom of preterm baby delivered by C/S for fetal bradycardia.   Mom states she pumped once, but lost some of the colostrum while pumping.  LC offered to assist.   LC reviewed breast massage and hand expression. LC provided Mom with a pumping top and sized her with 18 mm flanges.  LC assisted with initiation setting.  Mom encouraged to relax, nap, do hands on massage during pumping. LC collected 3 ml of colostrum and walked it to baby's room for RN to oral swab colostrum.  LC provided Mom with breast milk labels and instructed her on labeling each expression. FOB taught to disassemble pump parts, wash, rinse and place in separate basin to dry.  FOB watched LC demonstrate. LC wrote plan on dry erase board.  Mom does not have a DEBP at home.  LC to send [facility] referral and submit a STORK pump request.  OBJECTIVE Infant data: No data recorded O2 Device: Jet Vent FiO2 (%): 35 %  Infant feeding assessment No data recorded  Maternal data: G3P2103 C-Section, Low Transverse Has patient been taught Hand Expression?: Yes Hand Expression Comments: drop noted Significant Breast History:: ++ breast changes Current breast feeding challenges:: infant admitted to NICU Does the patient have breastfeeding experience prior to this delivery?: Yes How long did the patient breastfeed?: 2 years with each of her 2 babies Pumping frequency: Initiated pumping at 16 hrs post delivery, encouraged to pump every 3 hrs, goal of 8 times per 24 hrs Pumped volume: 3 mL Flange Size: 18 Hands-free pumping top sizes: Large Martina Sledge) Risk factor for low/delayed milk supply:: infant  separation  WIC Program: Yes WIC Referral Sent?: Yes What county?: [place] Pump: Referral sent for [employer] Pump  ASSESSMENT Infant:   Feeding Status: NPO  Maternal: Milk volume: Normal  INTERVENTIONS/PLAN Interventions: Interventions: Skin to skin; Breast feeding basics reviewed; Breast massage; Hand express; Expressed milk; Education; DEBP; LC Services brochure; CDC Guidelines for Breast Pump Cleaning; NICU Pumping Log Tools: Pump; Flanges; Hands-free pumping top Pump Education: Setup, frequency, and cleaning; Milk Storage  Plan: 1- STS with baby when able to 2- massage breasts and hand express 3- Pump both breasts on initiation setting 8 times per 24 hrs  Consult Status: NICU follow-up NICU Follow-up type: New admission follow up; Verify onset of copious milk; Verify absence of engorgement; Verify DEBP issuance   Dario Edison 07/04/2023, 10:55 AM

## 2023-07-04 NOTE — Clinical Social Work Maternal (Signed)
 CLINICAL SOCIAL WORK MATERNAL/CHILD NOTE  Patient Details  Name: Christie Parker MRN: [id number] Date of Birth: May 31, 1995  Date:  07/04/2023  Clinical Social Worker Initiating Note:  Bobbye Burrow, [place] Date/Time: Initiated:  07/04/23/1303     Child's Name:  Orien Bird   Biological Parents:  Mother, Father (Father: Neiva Maenza)   Need for Interpreter:  None   Reason for Referral:  Parental Support of Premature Babies < 32 weeks/or Critically Ill babies   Address:  [address] [place] [address]    Phone number:  [phone] (home)     Additional phone number:   Household Members/Support Persons (HM/SP):   Household Member/Support Person 3, Household Member/Support Person 1, Household Member/Support Person 2   HM/SP Name Relationship DOB or Age  HM/SP -1 Muhammad Fay Husband/FOB    HM/SP -2 Mehrab Quadros daughter 08/02/19  HM/SP -3 Muhammad Adnan son 11/12/20  HM/SP -4        HM/SP -5        HM/SP -6        HM/SP -7        HM/SP -8          Natural Supports (not living in the home):  Friends   Professional Supports: None   Employment: Futures trader   Type of Work:     Education:  Engineer, agricultural   Homebound arranged:    Surveyor, quantity Resources:  [employer]   Other Resources:  [employer], Sales executive     Cultural/Religious Considerations Which May Impact Care:    Strengths:  Ability to meet basic needs  , Merchandiser, retail, Understanding of illness   Psychotropic Medications:         Pediatrician:    Armed forces operational officer area  Pediatrician List:   [facility]  [place]    [place]      Pediatrician Fax Number:    Risk Factors/Current Problems:  None   Cognitive State:  Able to Concentrate  , Alert  , Insightful  , Goal Oriented  , Linear Thinking     Mood/Affect:  Calm  , Comfortable  , Euthymic  , Interested  , Relaxed     CSW Assessment: CSW  met with MOB at bedside to complete psychosocial assessment. MOB was accompanied by FOB and two children. CSW introduced self and explained role. MOB granted CSW verbal permission to speak in front of everyone about anything. MOB reported that she resides with her Husband/FOB and two children. MOB reported that she receives [facility] and Food stamps for the children. MOB explained that she and FOB are not  citizens so they don't receive that benefit. Parents reported that they had not started to shop for infant as they didn't expect an early arrival. CSW informed parents about [employer]'s [employer] if any assistance is needed obtaining items for infant. Parents reported that a referral for all essential items would be helpful, CSW agreed to make a referral. MOB reported that they have a crib and may need assistance with a car seat. CSW explained the hospital car seat program and agreed to check in with parents closer to discharge to see if a car seat is still needed, parents in agreement. CSW inquired about MOB's support system, MOB reported having friends as supports.   CSW and parents discussed infant's NICU admission. CSW informed parents about the NICU, what  to expect, and resources/supports available while infant is admitted to the NICU. Parents reported that they feel well informed about infant's care. MOB denied any transportation barriers. MOB explained that she may not visit daily due to FOB working and going to school as she will be home with the children. MOB verbalized that she plans to visit more once infant is older. CSW normalized MOB's responsibilities outside of the NICU and reassured MOB that it's not the expectation that she visit daily if her scheduled doesn't permit. MOB reported that a gas card would be helpful, CSW agreed to place at infant's bedside.   FOB reported that he was taking the children to visit with infant in the NICU. FOB and children left the room.   CSW  inquired about MOB's mental health history. MOB denied any mental health history. CSW inquired about how MOB was feeling emotionally since giving birth, MOB reported that she was feeling fine. MOB presented calm and did not demonstrate any acute mental health signs/symptoms. CSW assessed for safety, MOB denied SI, HI, and domestic violence.   CSW provided education regarding the baby blues period vs. perinatal mood disorders, discussed treatment and gave resources for mental health follow up if concerns arise.  CSW recommends self-evaluation during the postpartum time period using the New Mom Checklist from Postpartum Progress and encouraged MOB to contact a medical professional if symptoms are noted at any time.    FOB and children returned to the room, FOB reported that they were unable to visit because a visitor form needed to be signed.   CSW accompanied parents and their children to the NICU for sibling visitation. CSW obtained visitation form for MOB to sign. FOB and their daughter visited with infant at bedside, CSW accompanied them to the room. CSW walked with FOB and their daughter back to MOB's room.  CSW informed parents that infant qualifies to apply for SSI benefits and explained benefits/process. Parents reported that they will read about it as they are unsure about how it will impact their citizenship status. CSW explained that it is optional and parents do not have to apply if they are not interested.   CSW inquired about any additional needs for resources/supports, parents reported none.   CSW placed gas card at infant's bedside.   CSW made FSN referral for requested items.   CSW will continue to offer resources/supports as MOB opted for CSW to check in weekly.    CSW Plan/Description:  Perinatal Mood and Anxiety Disorder (PMADs) Education, Psychosocial Support and Ongoing Assessment of Needs, Other Information/Referral to [employer], [employer] Income (SSI)  Information, Other Patient/Family Education    Neta Baptist, LCSW 07/04/2023, 1:13 PM

## 2023-07-04 NOTE — Progress Notes (Signed)
 Subjective: Postpartum Day 1: Cesarean Delivery Patient reports incisional pain and tolerating PO.    Objective: Vital signs in last 24 hours: Temp:  [97.9 F (36.6 C)-99.4 F (37.4 C)] 98.1 F (36.7 C) (05/30 0452) Pulse Rate:  [88-113] 108 (05/30 0452) Resp:  [14-19] 16 (05/30 0452) BP: (88-124)/(42-72) 124/58 (05/30 0452) SpO2:  [89 %-98 %] 96 % (05/30 0452) Weight:  [84 kg] 84 kg (05/29 1435)  Physical Exam:  General: alert, cooperative, and no distress Lochia: appropriate Uterine Fundus: firm Incision: no significant drainage DVT Evaluation: No evidence of DVT seen on physical exam.  Recent Labs    07/03/23 1454 07/04/23 0426  HGB 11.8* 9.6*  HCT 35.3* 28.5*    Assessment/Plan: Status post Cesarean section. Doing well postoperatively.  Continue current care.  Onnie Bilis, MD 07/04/2023, 7:26 AM

## 2023-07-05 LAB — SJOGRENS SYNDROME-B EXTRACTABLE NUCLEAR ANTIBODY: SSB (La) (ENA) Antibody, IgG: <0.2 AI (ref 0.0–0.9)

## 2023-07-05 LAB — SJOGRENS SYNDROME-A EXTRACTABLE NUCLEAR ANTIBODY: SSA (Ro) (ENA) Antibody, IgG: <0.2 AI (ref 0.0–0.9)

## 2023-07-05 MED ORDER — IBUPROFEN 600 MG PO TABS: 600.0000 mg | ORAL_TABLET | Freq: Four times a day (QID) | ORAL | 0 refills | Status: DC

## 2023-07-05 MED ORDER — OXYCODONE HCL 5 MG PO TABS: 5.0000 mg | ORAL_TABLET | ORAL | 0 refills | Status: AC | PRN

## 2023-07-07 ENCOUNTER — Encounter (HOSPITAL_COMMUNITY): Payer: Self-pay | Admitting: Family Medicine

## 2023-07-08 ENCOUNTER — Encounter: Admitting: Obstetrics and Gynecology

## 2023-07-08 ENCOUNTER — Other Ambulatory Visit

## 2023-07-11 ENCOUNTER — Ambulatory Visit

## 2023-07-11 ENCOUNTER — Other Ambulatory Visit: Payer: Self-pay

## 2023-07-11 VITALS — BP 122/67 | HR 86 | Ht 62.0 in | Wt 181.7 lb

## 2023-07-11 DIAGNOSIS — Z5189 Encounter for other specified aftercare: Secondary | ICD-10-CM

## 2023-07-11 NOTE — Progress Notes (Signed)
 Pt here today for wound check s/p primary c-section on 07/03/23.  Pt reports mild pain and light vaginal bleeding. Pt denies any concerns.  BP 122/67.  Observed incision- well approximated, no odor, no drainage, no edema, and no erythema.  Pt advised to continue to monitor for sx's of infection, contact the office with concerns, and that we will f/u at her pp visit on 08/04/23.  Pt verbalized understanding with no further questions.   Khalif Stender,RN  07/11/23

## 2023-07-17 LAB — SURGICAL PATHOLOGY

## 2023-08-04 ENCOUNTER — Encounter: Payer: Self-pay | Admitting: Advanced Practice Midwife

## 2023-08-04 ENCOUNTER — Ambulatory Visit: Payer: Self-pay | Admitting: Advanced Practice Midwife

## 2023-08-04 ENCOUNTER — Other Ambulatory Visit (HOSPITAL_COMMUNITY)
Admission: RE | Admit: 2023-08-04 | Discharge: 2023-08-04 | Disposition: A | Discharge status: Home / Self Care | Source: Ambulatory Visit | Attending: Advanced Practice Midwife | Admitting: Advanced Practice Midwife

## 2023-08-04 ENCOUNTER — Other Ambulatory Visit: Payer: Self-pay

## 2023-08-04 VITALS — BP 114/76 | HR 102 | Wt 179.0 lb

## 2023-08-04 DIAGNOSIS — Z98891 History of uterine scar from previous surgery: Secondary | ICD-10-CM | POA: Diagnosis not present

## 2023-08-04 DIAGNOSIS — Z124 Encounter for screening for malignant neoplasm of cervix: Secondary | ICD-10-CM | POA: Diagnosis present

## 2023-08-04 DIAGNOSIS — M7918 Myalgia, other site: Secondary | ICD-10-CM | POA: Diagnosis not present

## 2023-08-04 DIAGNOSIS — Z30430 Encounter for insertion of intrauterine contraceptive device: Secondary | ICD-10-CM | POA: Diagnosis not present

## 2023-08-04 DIAGNOSIS — Z3202 Encounter for pregnancy test, result negative: Secondary | ICD-10-CM

## 2023-08-04 LAB — POCT PREGNANCY, URINE: Preg Test, Ur: NEGATIVE

## 2023-08-04 MED ORDER — CYCLOBENZAPRINE HCL 10 MG PO TABS: 5.0000 mg | ORAL_TABLET | Freq: Three times a day (TID) | ORAL | 1 refills | Status: AC | PRN

## 2023-08-04 MED ORDER — IBUPROFEN 600 MG PO TABS: 600.0000 mg | ORAL_TABLET | Freq: Four times a day (QID) | ORAL | 1 refills | Status: AC

## 2023-08-04 MED ORDER — LEVONORGESTREL 20.1 MCG/DAY IU IUD
1.0000 | INTRAUTERINE_SYSTEM | Freq: Once | INTRAUTERINE | Status: AC
  Administered 2023-08-04: 1 via INTRAUTERINE

## 2023-08-04 NOTE — Progress Notes (Unsigned)
 Post Partum Visit Note  Christie Parker is a 28 y.o. [phone] female who presents for a postpartum visit. She is 4 weeks postpartum following a low transverse c-section.  I have fully reviewed the prenatal and intrapartum course. The delivery was at 27 gestational weeks.  Anesthesia: general. Postpartum course has been uncomplicated . Baby is in nursery  Baby is feeding by breast. Bleeding staining only. Bowel function is normal. Bladder function is normal. Patient is not sexually active. Contraception method is IUD. Postpartum depression screening: negative.   The pregnancy intention screening data noted above was reviewed. Potential methods of contraception were discussed. The patient elected to proceed with No data recorded.    Health Maintenance Due  Topic Date Due   Hepatitis B Vaccines (1 of 3 - 19+ 3-dose series) Never done   HPV VACCINES (1 - 3-dose SCDM series) Never done   COVID-19 Vaccine (1 - 2024-25 season) Never done   Cervical Cancer Screening (Pap smear)  05/26/2023    The following portions of the patient's history were reviewed and updated as appropriate: allergies, current medications, past family history, past medical history, past social history, past surgical history, and problem list.  Review of Systems Pertinent items are noted in HPI.  Objective:  BP 114/76   Pulse (!) 102   Wt 179 lb (81.2 kg)   LMP 12/22/2022   Breastfeeding No   BMI 32.74 kg/m    General:  {gen appearance:16600}   Breasts:  {desc; normal/abnormal/not indicated:14647}  Lungs: {lung exam:16931}  Heart:  {heart exam:5510}  Abdomen: {abdomen exam:16834}   Wound {Wound assessment:11097}  GU exam:  {desc; normal/abnormal/not indicated:14647}       Assessment:    There are no diagnoses linked to this encounter.  *** postpartum exam.   Plan:   Essential components of care per ACOG recommendations:  1.  Mood and well being: Patient with {gen negative/positive:315881} depression  screening today. Reviewed local resources for support.  - Patient tobacco use? {tobacco use:25506}  - hx of drug use? {yes/no:25505}    2. Infant care and feeding:  -Patient currently breastmilk feeding? {yes/no:25502}  -Social determinants of health (SDOH) reviewed in EPIC. No concerns***The following needs were identified***  3. Sexuality, contraception and birth spacing - Patient {DOES_DOES WNU:81435} want a pregnancy in the next year.  Desired family size is {NUMBER 1-10:22536} children.  - Reviewed reproductive life planning. Reviewed contraceptive methods based on pt preferences and effectiveness.  Patient desired {[employer] Methods:24109} today.   - Discussed birth spacing of 18 months  4. Sleep and fatigue -Encouraged family/partner/community support of 4 hrs of uninterrupted sleep to help with mood and fatigue  5. Physical Recovery  - Discussed patients delivery and complications. She describes her labor as {description:25511} - Patient had a {CHL AMB [phone]}. Patient had a {laceration:25518} laceration. Perineal healing reviewed. Patient expressed understanding - Patient has urinary incontinence? {yes/no:25515} - Patient {ACTION; IS/IS WNU:78978602} safe to resume physical and sexual activity  6.  Health Maintenance - HM due items addressed {Yes or If no, why not?:20788} - Last pap smear  Diagnosis  Date Value Ref Range Status  05/25/2020   Final   - Negative for intraepithelial lesion or malignancy (NILM)   Pap smear {done:10129} at today's visit.  -Breast Cancer screening indicated? {indicated:25516}  7. Chronic Disease/Pregnancy Condition follow up: {Follow up:25499}  - PCP follow up  Patient identified, informed consent performed, signed copy in chart, time out was performed.  Urine pregnancy test  negative.  Speculum placed in the vagina.  Cervix visualized.  Cleaned with Betadine x 2.  Grasped anteriourly/posteriorly with a single tooth tenaculum.   Uterus sounded to 7 cm.  Liletta  IUD placed per manufacturer's recommendations.  Strings trimmed to 3 cm. Pt tolerated procedure well. IUD placement checked w/ BS US . Appears to be properly positioned.   Patient given post procedure instructions and IUD care card with expiration date.  Patient is asked to check IUD strings periodically and follow up in 4-6 weeks for IUD check   Christie Parker  [place], [facility] for [employer], [facility]

## 2023-08-04 NOTE — Patient Instructions (Signed)
IUD AFTERCARE INSTRUCTIONS   Warning Signs  Call the clinic if any of the following occurs:   Severe abdominal pain or cramping   Unusual bleeding   Fever or chills   Foul smelling vaginal discharge   Painful intercourse   Positive pregnancy test.  1. Uterine cramping is common after IUD placement. You can help relieve the discomfort  with heating pads, Tylenol (acetaminophen), Aspirin or Advil (ibuprofen). If your  cramping becomes very painful, please call the clinic.   2. Irregular bleeding and spotting is normal for the first few months after the IUD is placed.  In some cases, women may experience irregular bleeding or spotting for up to six months  after the IUD is placed. This bleeding can be annoying at first but usually will become  lighter with the Mirena/Liletta IUD quickly. Call the clinic if your bleeding is excessive and not  getting better.   3. Your period will likely be shorter and lighter with a Mirena IUD. Approximately 40% of  women will stop having periods altogether with the Mirena/Liletta IUD. Your period may be  heavier and longer with the Paragard IUD.   4. IUDs do not protect against sexually transmitted infections including the AIDS virus  (HIV), warts (HPV), gonorrhea, Chlamydia, and herpes. Condoms should be used to  decrease the risk sexually transmitted infections. If you think that you have been exposed  to a sexually transmitted infection, please call the clinic.   5. If you had the IUD placed for birth control, the Paragard IUD is effective immediately.  The Mirena/Liletta IUD is effective immediately if it was inserted within seven days after the  start of your period. If you have Mirena/Liletta inserted at any other time during your menstrual  cycle, use another method of birth control, like condoms for at least 7 days.   6. It is possible for the IUD to come out of the uterus. If it does slip out of place, it is most  likely to happen in the  first few months after being put in. To make sure your IUD is in  place, you can feel for the IUD strings between periods. To check for strings, wash your  hands. Then, sit or squat down. Place one finger into your vagina until you feel your  cervix. It will feel hard and rubbery, like the end of your nose. The string ends should be  coming through your cervix. Do not pull on the strings. If the strings feel much longer  than before, if you feel the hard plastic part of the IUD, or if you cannot feel the strings at  all, the IUD may have moved out of place. Please call the clinic and consider using a  back up form of birth control until you are seen.   7. Keep your follow-up appointment for 4-6 weeks after the IUD has been placed.   8. Pregnancy is unlikely after IUD placement, but can happen. If you have early pregnancy  symptoms like nausea and vomiting, breast tenderness, frequent urination or abdominal  pain, you can take a pregnancy test. Please call the clinic if you have any concerns or if  your pregnancy test is positive.   9. The IUD should only be removed by a healthcare provider.  The Mirena/Liletta IUD should be removed and/or replaced after 8 years.  The Paragard IUD should be removed and/or replaced after 10 years.

## 2023-08-07 ENCOUNTER — Encounter: Payer: Self-pay | Admitting: Advanced Practice Midwife

## 2023-08-07 LAB — CYTOLOGY - PAP
Diagnosis: NEGATIVE
Diagnosis: REACTIVE

## 2023-08-13 ENCOUNTER — Ambulatory Visit: Payer: Self-pay | Admitting: Advanced Practice Midwife

## 2023-09-16 ENCOUNTER — Ambulatory Visit: Admitting: Advanced Practice Midwife
